# Patient Record
Sex: Female | Born: 1970
Health system: Southern US, Community
[De-identification: ages and names within clinical notes are randomized; demographics above are authoritative.]

## PROBLEM LIST (undated history)

## (undated) DIAGNOSIS — I10 Essential (primary) hypertension: Secondary | ICD-10-CM

## (undated) DIAGNOSIS — J45909 Unspecified asthma, uncomplicated: Secondary | ICD-10-CM

## (undated) DIAGNOSIS — D649 Anemia, unspecified: Secondary | ICD-10-CM

## (undated) DIAGNOSIS — C801 Malignant (primary) neoplasm, unspecified: Secondary | ICD-10-CM

## (undated) HISTORY — DX: Anemia, unspecified: D64.9

## (undated) HISTORY — PX: BREAST EXCISIONAL BIOPSY: SUR124

## (undated) HISTORY — DX: Malignant (primary) neoplasm, unspecified: C80.1

## (undated) HISTORY — DX: Essential (primary) hypertension: I10

## (undated) HISTORY — PX: CERVICAL BIOPSY: SHX590

## (undated) HISTORY — PX: COLONOSCOPY: SHX174

## (undated) HISTORY — PX: COSMETIC SURGERY: SHX468

## (undated) HISTORY — PX: ESOPHAGOGASTRODUODENOSCOPY: SHX1529

## (undated) HISTORY — PX: CHOLECYSTECTOMY: SHX55

---

## 1999-09-01 ENCOUNTER — Other Ambulatory Visit: Admission: RE | Admit: 1999-09-01 | Discharge: 1999-09-01 | Payer: Self-pay | Admitting: Obstetrics and Gynecology

## 1999-10-11 ENCOUNTER — Inpatient Hospital Stay (HOSPITAL_COMMUNITY): Admission: AD | Admit: 1999-10-11 | Discharge: 1999-10-11 | Payer: Self-pay | Admitting: Obstetrics & Gynecology

## 1999-12-03 ENCOUNTER — Encounter: Payer: Self-pay | Admitting: Obstetrics and Gynecology

## 1999-12-03 ENCOUNTER — Ambulatory Visit (HOSPITAL_COMMUNITY): Admission: RE | Admit: 1999-12-03 | Discharge: 1999-12-03 | Payer: Self-pay | Admitting: Obstetrics and Gynecology

## 1999-12-11 ENCOUNTER — Ambulatory Visit (HOSPITAL_COMMUNITY): Admission: RE | Admit: 1999-12-11 | Discharge: 1999-12-11 | Payer: Self-pay | Admitting: Obstetrics and Gynecology

## 1999-12-11 ENCOUNTER — Encounter: Payer: Self-pay | Admitting: Obstetrics and Gynecology

## 1999-12-16 ENCOUNTER — Inpatient Hospital Stay (HOSPITAL_COMMUNITY): Admission: AD | Admit: 1999-12-16 | Discharge: 1999-12-16 | Payer: Self-pay | Admitting: Obstetrics and Gynecology

## 2000-05-09 ENCOUNTER — Encounter: Payer: Self-pay | Admitting: *Deleted

## 2000-05-09 ENCOUNTER — Encounter: Admission: RE | Admit: 2000-05-09 | Discharge: 2000-05-09 | Payer: Self-pay | Admitting: *Deleted

## 2001-03-16 ENCOUNTER — Inpatient Hospital Stay (HOSPITAL_COMMUNITY): Admission: AD | Admit: 2001-03-16 | Discharge: 2001-03-16 | Payer: Self-pay | Admitting: Obstetrics and Gynecology

## 2001-03-19 ENCOUNTER — Inpatient Hospital Stay (HOSPITAL_COMMUNITY): Admission: AD | Admit: 2001-03-19 | Discharge: 2001-03-22 | Payer: Self-pay | Admitting: Obstetrics & Gynecology

## 2001-04-23 ENCOUNTER — Other Ambulatory Visit: Admission: RE | Admit: 2001-04-23 | Discharge: 2001-04-23 | Payer: Self-pay | Admitting: Obstetrics and Gynecology

## 2002-05-08 ENCOUNTER — Other Ambulatory Visit: Admission: RE | Admit: 2002-05-08 | Discharge: 2002-05-08 | Payer: Self-pay | Admitting: Obstetrics and Gynecology

## 2003-05-14 ENCOUNTER — Other Ambulatory Visit: Admission: RE | Admit: 2003-05-14 | Discharge: 2003-05-14 | Payer: Self-pay | Admitting: Obstetrics and Gynecology

## 2003-07-29 ENCOUNTER — Ambulatory Visit (HOSPITAL_COMMUNITY): Admission: RE | Admit: 2003-07-29 | Discharge: 2003-07-29 | Payer: Self-pay | Admitting: Obstetrics and Gynecology

## 2003-07-29 IMAGING — US US OB COMP LESS 14 WK
1 series · 18 of 26 positions shown · non-contrast
Comparison: none

CLINICAL DATA: Spotting.  No fetal heart tones noted on office ultrasound.  G3 P1 SAB1.  EDC [DATE] by office ultrasound.
 OBSTETRICAL ULTRASOUND WITH TRANSVAGINAL SCAN:
 Transabdominal and transvaginal scanning of the pelvis was performed.  
 There is an intrauterine gestational sac containing a single living fetus with a regular heart rate of 182 bpm.  By crown rump length, the gestation is estimated at 10 weeks 1 day.  The patient is 9 weeks 3 days by first office ultrasound.  Yolk sac and amnion are identified.  
 There is an anterior fibroid measuring 3.4 x 2.6 x 4.0 cm.  Right ovary could not be seen.  Left ovary is normal.
 IMPRESSION
 Single living intrauterine fetus.  Patient is 9 weeks 3 days by first office ultrasound and measures 10 weeks 1 day today.  Growth is appropriate.
 Anterior fibroid measuring 3.4 x 2.6 x 4.0 cm.

[Series 1: us ob transvaginal modify · 18 of 26 slices shown]
[im 1/26]
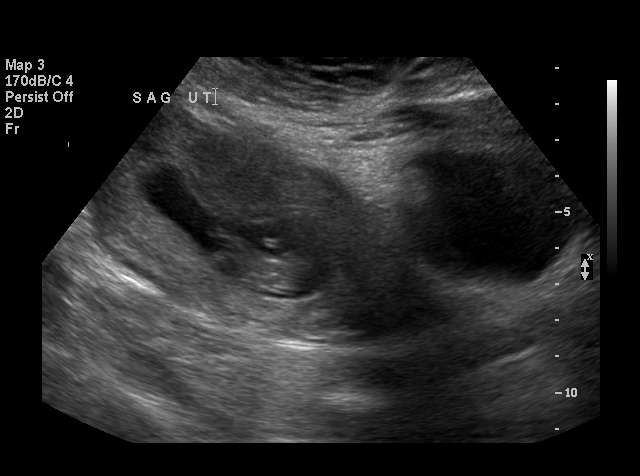
[im 3/26]
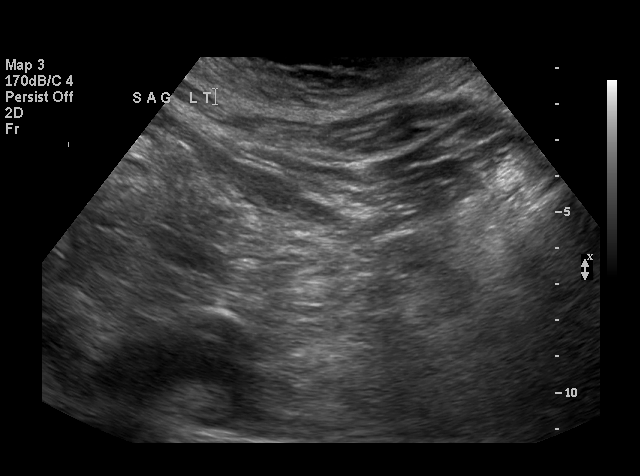
[im 4/26]
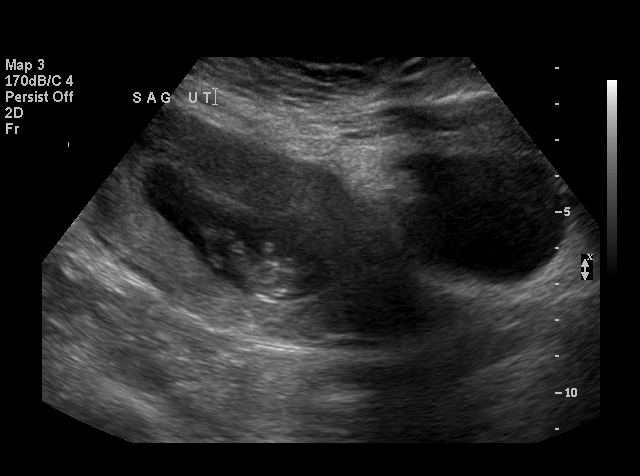
[im 6/26]
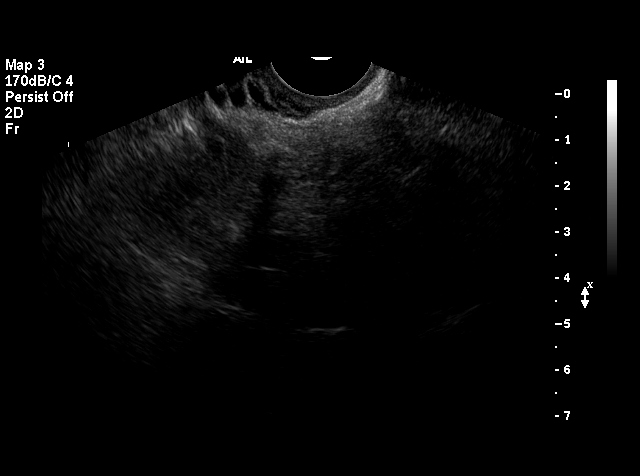
[im 7/26]
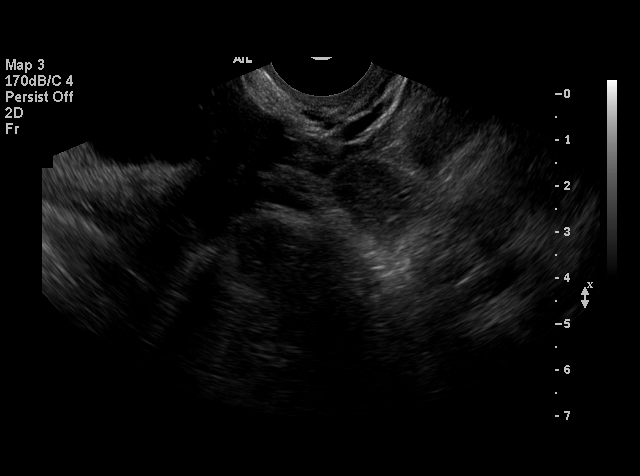
[im 8/26]
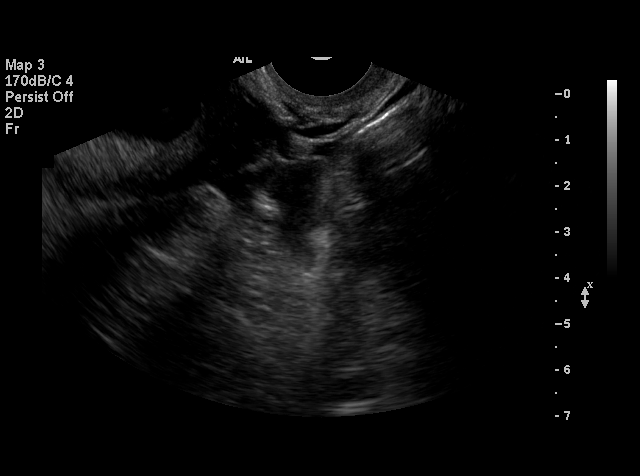
[im 10/26]
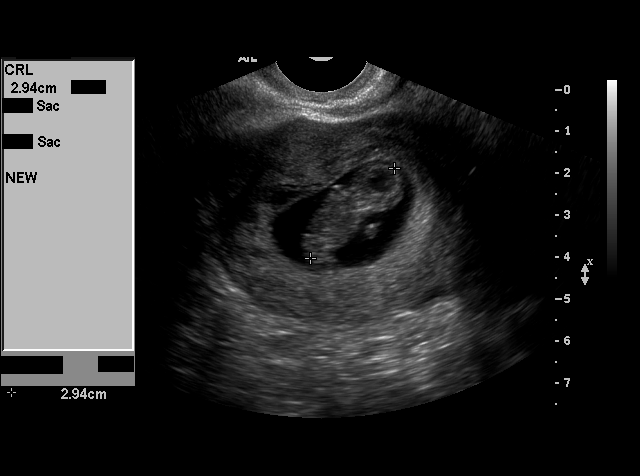
[im 11/26]
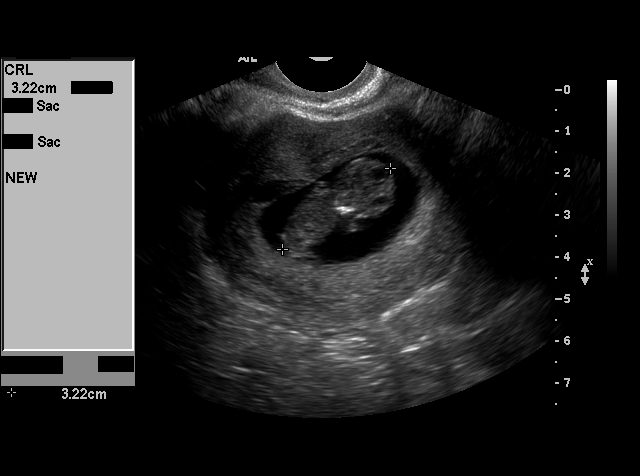
[im 13/26]
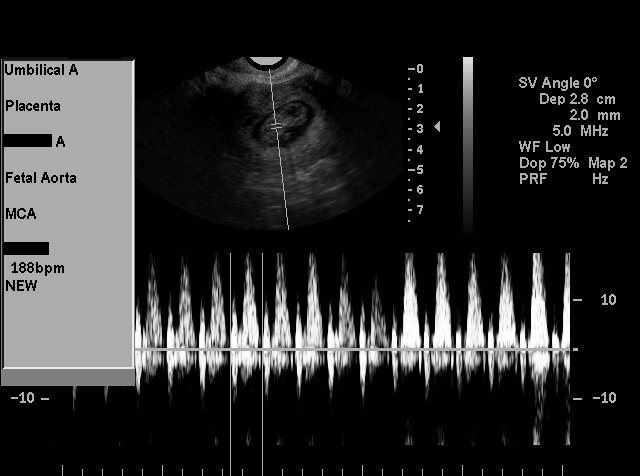
[im 14/26]
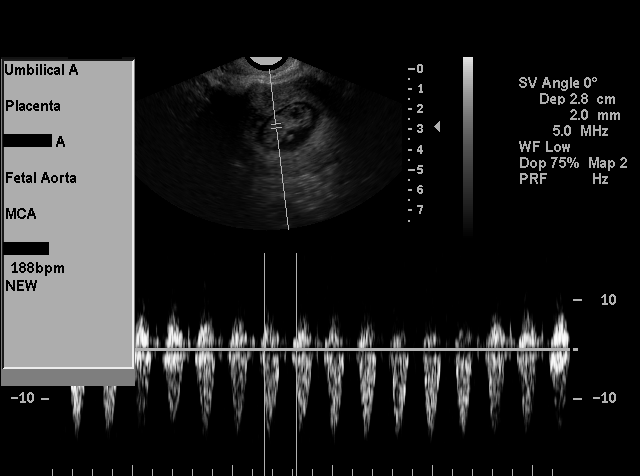
[im 16/26]
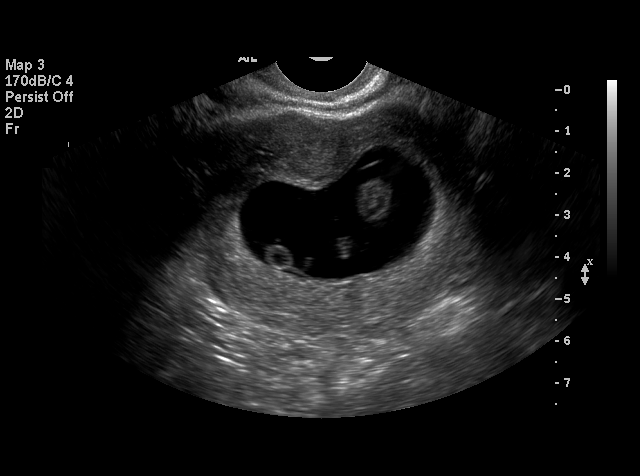
[im 17/26]
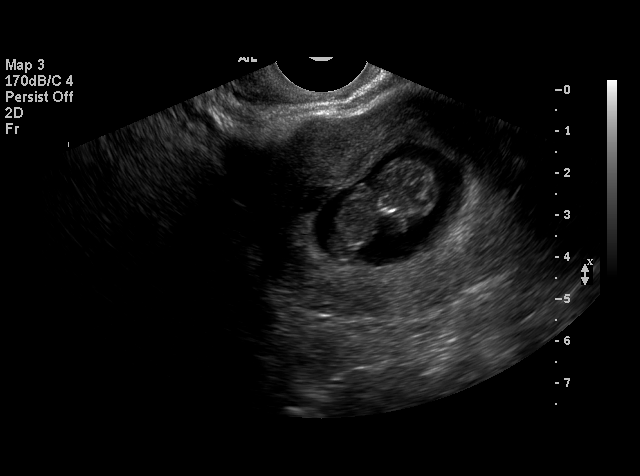
[im 19/26]
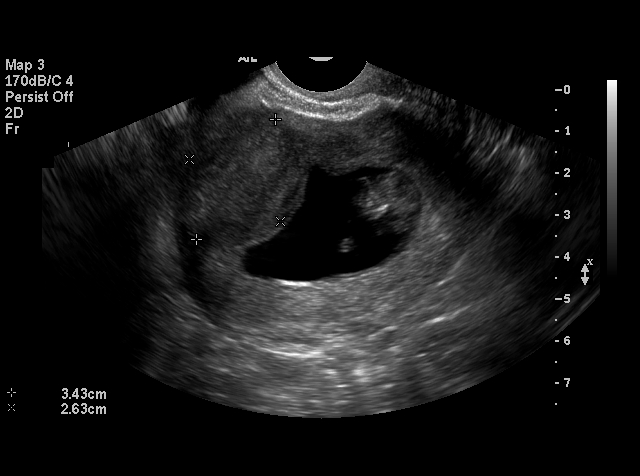
[im 20/26]
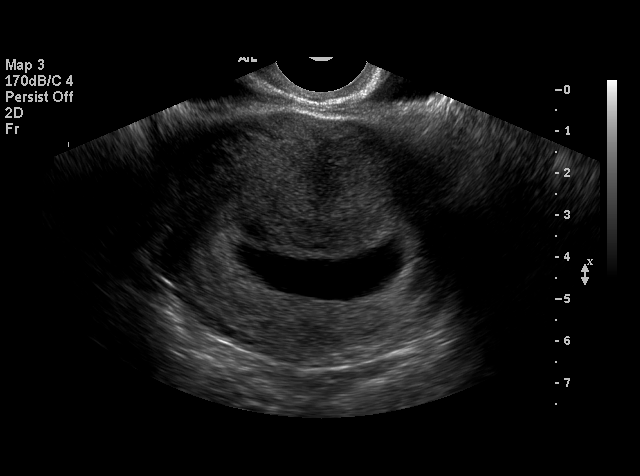
[im 21/26]
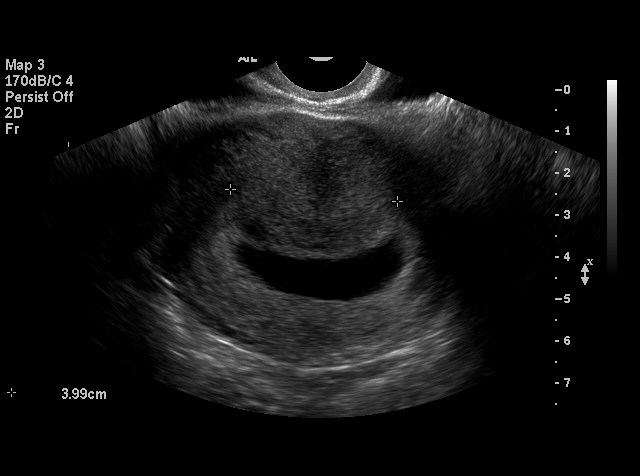
[im 23/26]
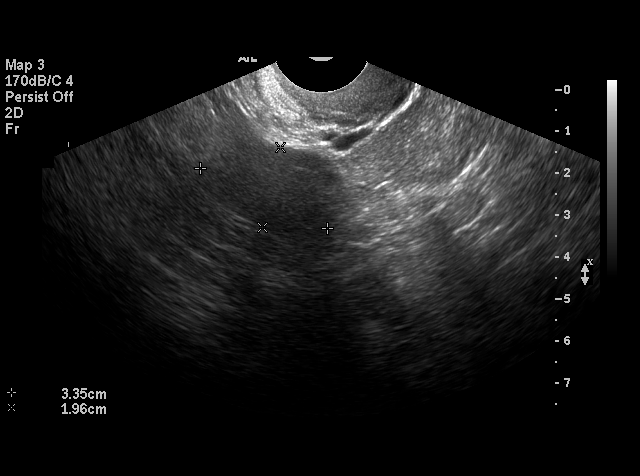
[im 24/26]
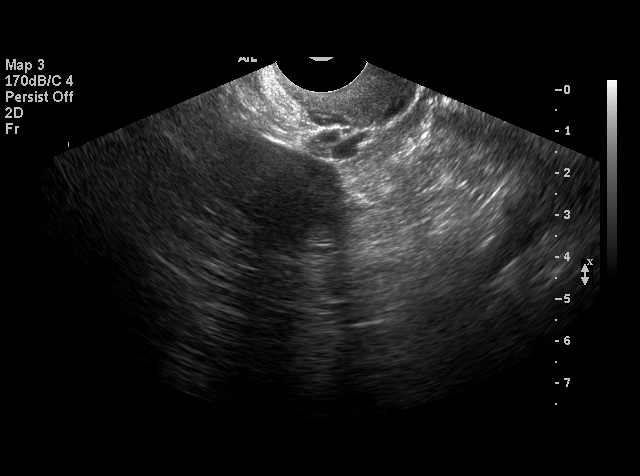
[im 26/26]
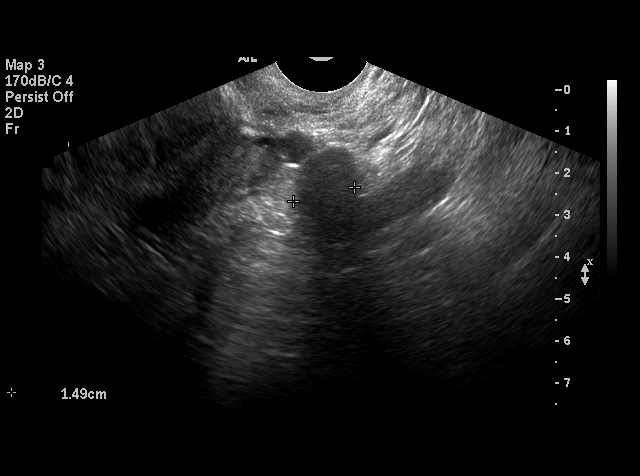

[18 of 26 positions shown; findings below may reference images not displayed]

## 2003-09-25 ENCOUNTER — Ambulatory Visit (HOSPITAL_COMMUNITY): Admission: RE | Admit: 2003-09-25 | Discharge: 2003-09-25 | Payer: Self-pay | Admitting: Obstetrics and Gynecology

## 2003-09-25 IMAGING — US US OB COMP +14 WK
1 series · 11 of 11 positions shown · non-contrast
Comparison: none

CLINICAL DATA: Anatomic exam.

[Series 1: unknown · 0.17mm/px · 11 of 11 slices shown]
[im 1/11]
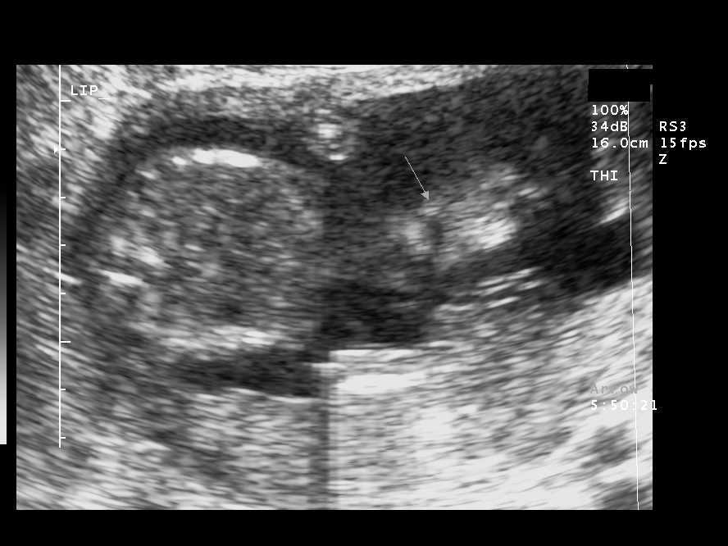
[im 2/11]
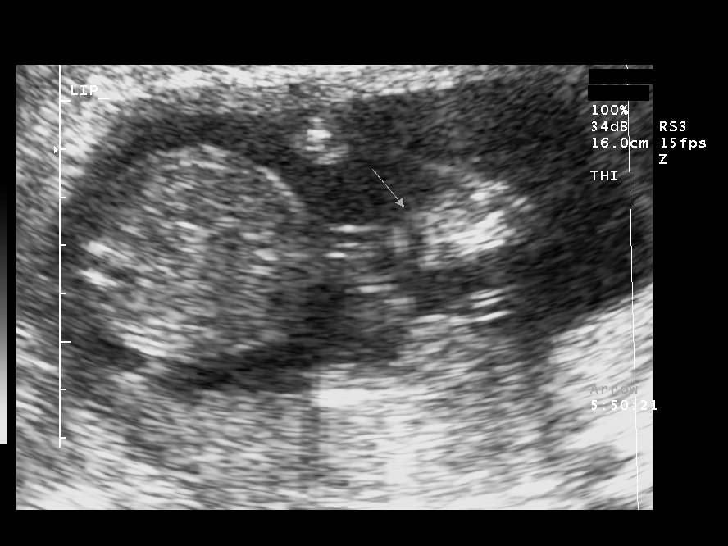
[im 3/11]
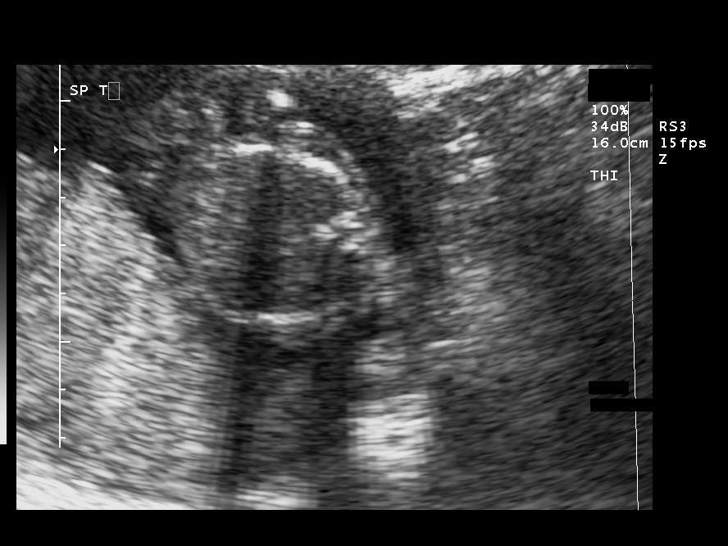
[im 4/11]
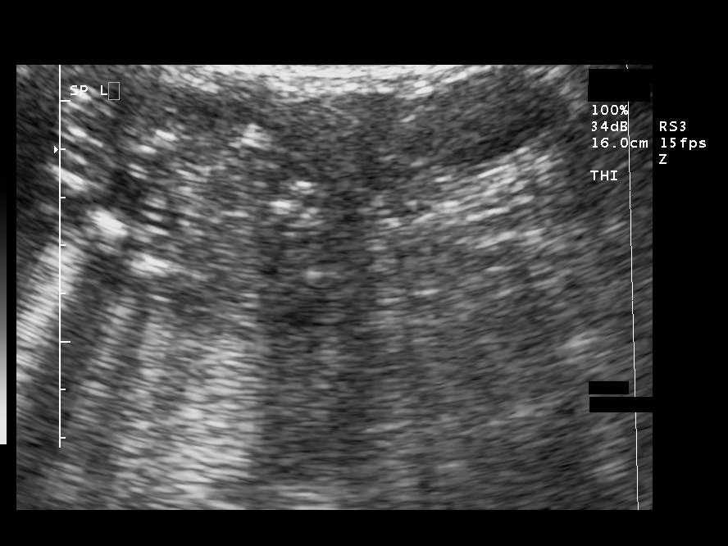
[im 5/11]
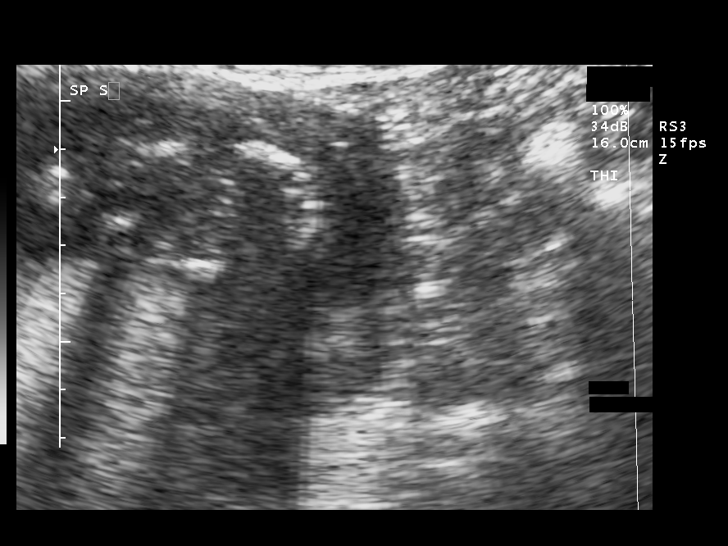
[im 6/11]
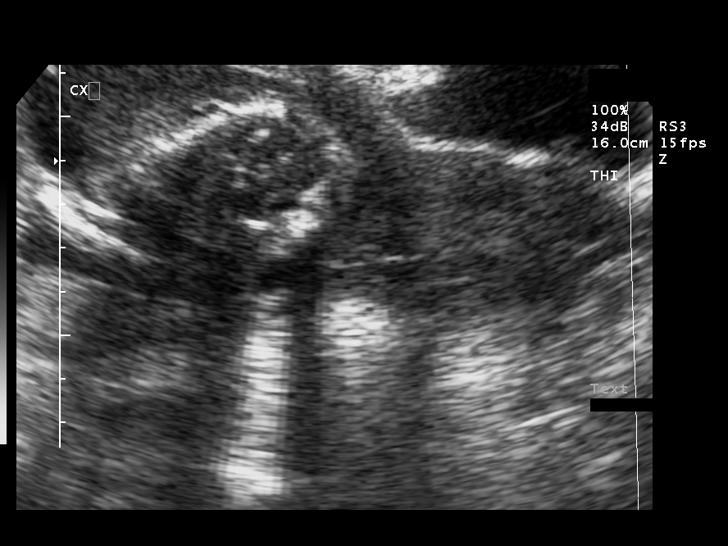
[im 7/11]
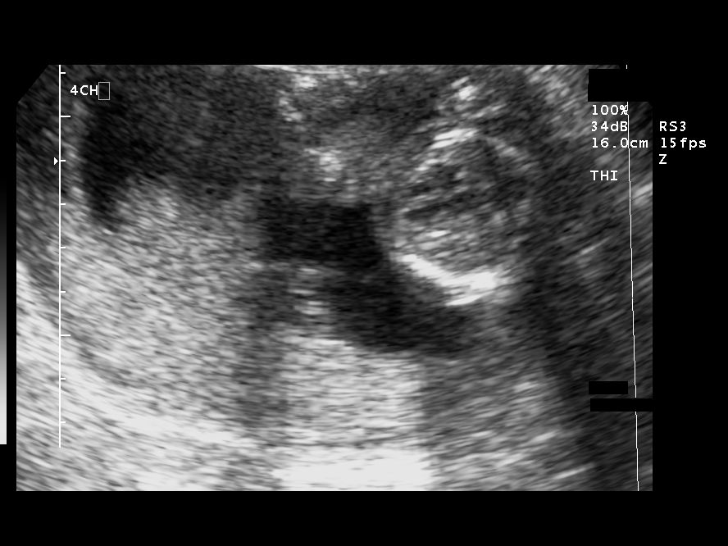
[im 8/11]
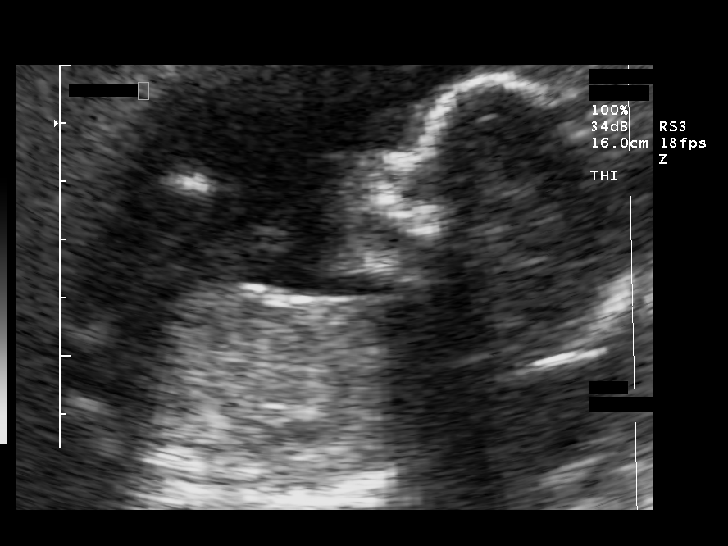
[im 9/11]
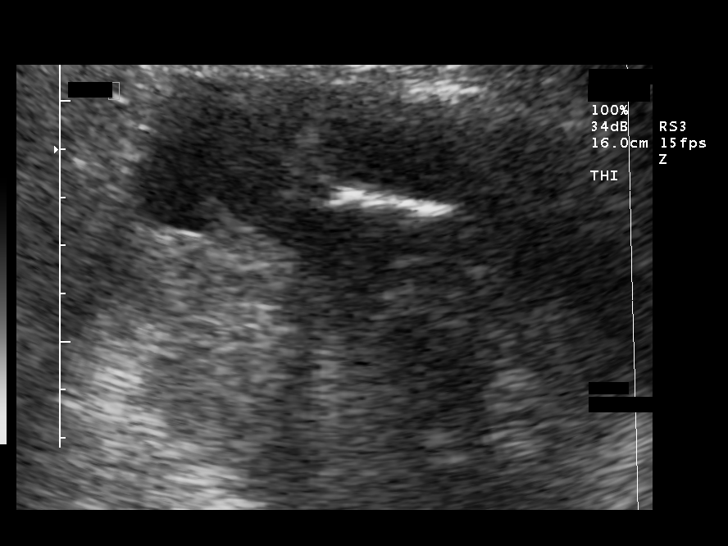
[im 10/11]
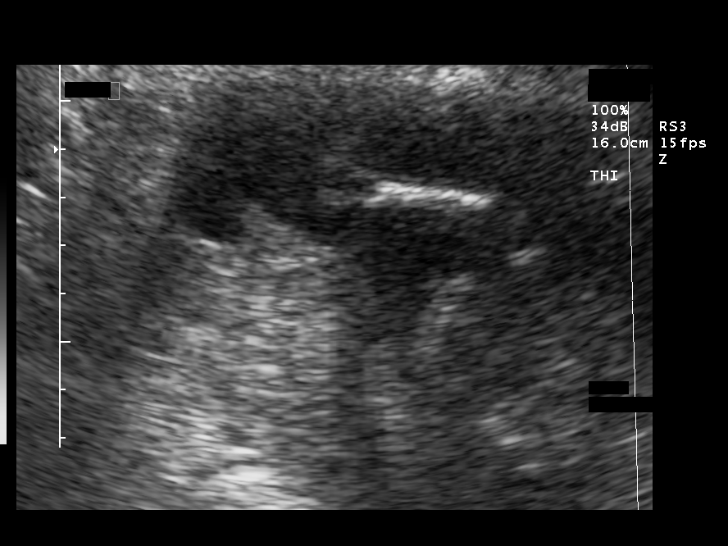
[im 11/11]
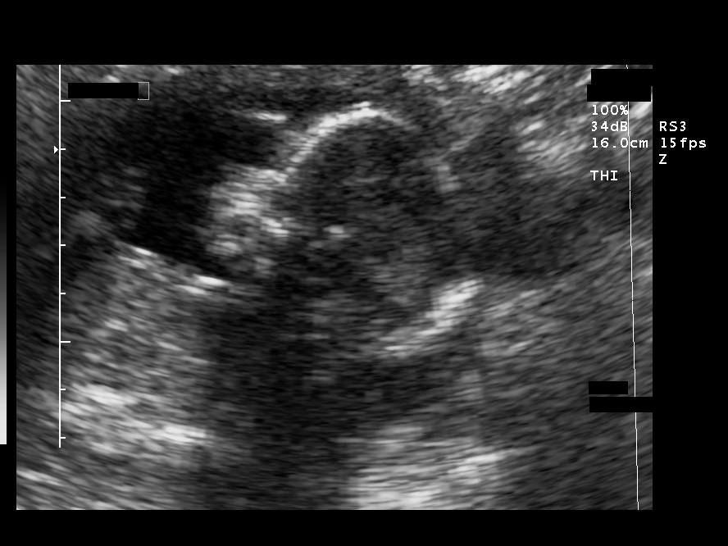

[11 of 11 positions shown; findings below may reference images not displayed]

OBSTETRICAL ULTRASOUND
Number of Fetuses: 1
Heart Rate:  142
Movement:  Yes
Breathing:  No  
Presentation:  Cephalic
Placental Location:  Posterior
Grade:  I
Previa:  No
Amniotic Fluid (Subjective):  Normal
Amniotic Fluid (Objective):   4.1 cm Vertical pocket 

FETAL BIOMETRY
BPD:  4.0 cm   18 w 4 d
HC:  15.7 cm   18 w 1 d
AC:  14.9 cm   20 w 1 d
FL:  2.8 cm   18 w 4 d

MEAN GA:  18 w 6 d
1st US GA:  17 w 5 d (assigned)    

FETAL ANATOMY
Lateral Ventricles:  Visualized 
Thalami/CSP:      Visualized 
Posterior Fossa:  Visualized 
Nuchal Region:    Visualized 
Spine:      Visualized 
4 Chamber Heart on Left:      Visualized 
Stomach on Left:      Visualized 
3 Vessel Cord:    Visualized 
Cord Insertion site:    Visualized 
Kidneys:  Visualized 
Bladder:  Visualized 
Extremities:      Visualized 

ADDITIONAL ANATOMY VISUALIZED:  Upper lip, orbits, profile, diaphragm, heel, 5th digit, and female genitalia

Evaluation limited by:  Maternal habitus

MATERNAL FINDINGS
Cervix: 4.4 cm Transabdominally
Comment:  The ovaries are within normal limits.
IMPRESSION: Single intrauterine pregnancy demonstrating an estimated gestational age by ultrasound of 18 weeks and 6 days.  This is 1 week and 1 day ahead of assigned gestational age by initial ultrasound of 17 weeks and 5 days.  
No focal fetal or placental abnormalities are noted.  An incomplete cardiac exam was possible due to maternal body habitus limiting resolution capability on today?s exam.  Follow-up evaluation at a later point in gestation can be undertaken for hopeful improvement in cardiac visualization.

## 2003-09-25 IMAGING — US US OB COMP +14 WK
1 series · 13 of 28 positions shown · non-contrast
Comparison: none

CLINICAL DATA: Anatomic exam.

[Series 1: unknown · 0.18mm/px · 13 of 62 slices shown]
[im 3/62]
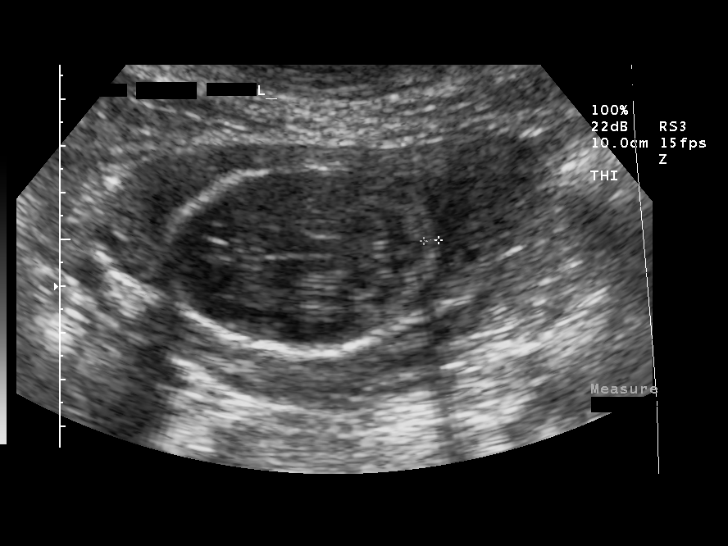
[im 7/62]
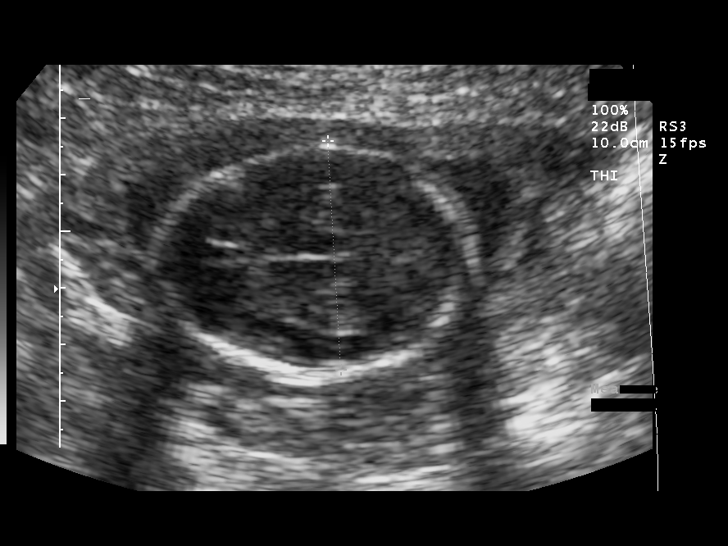
[im 12/62]
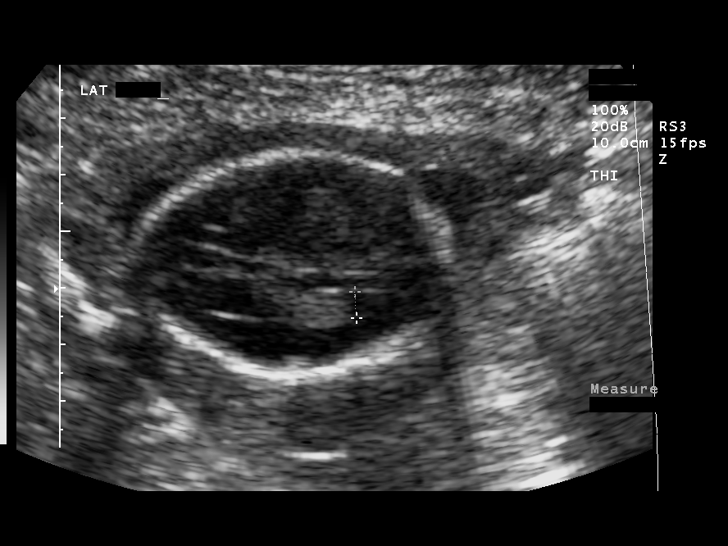
[im 16/62]
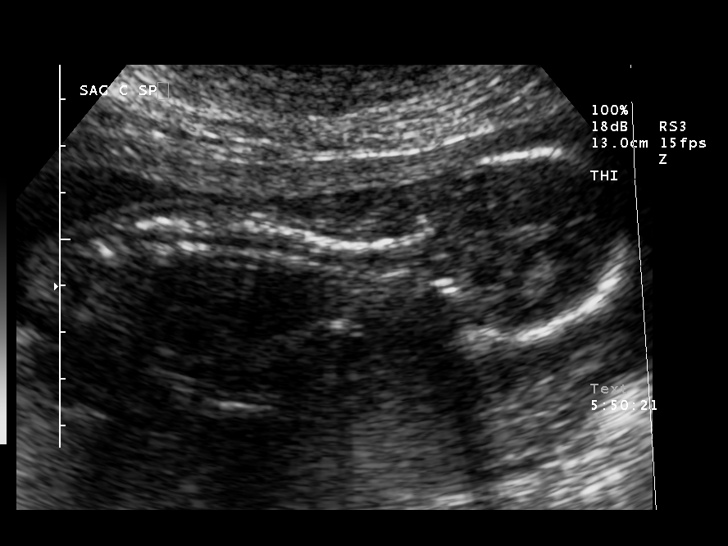
[im 21/62]
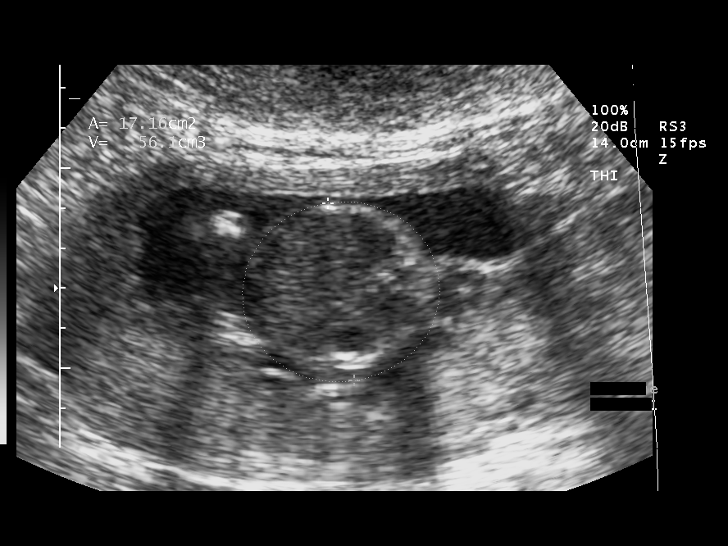
[im 25/62]
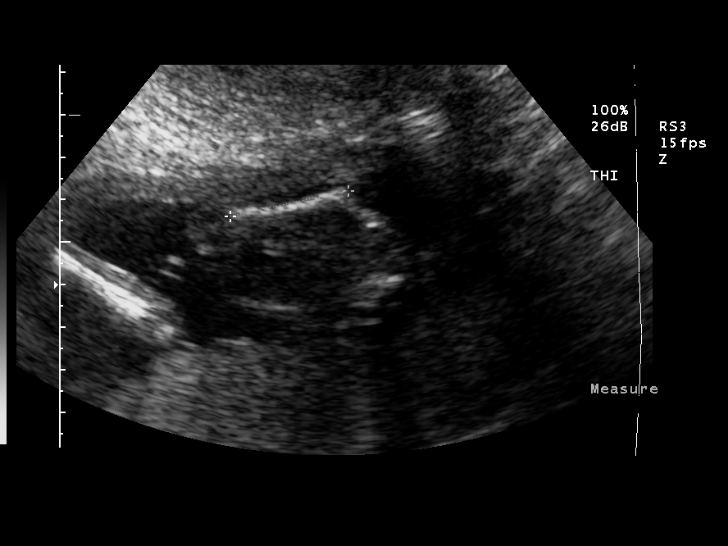
[im 32/62]
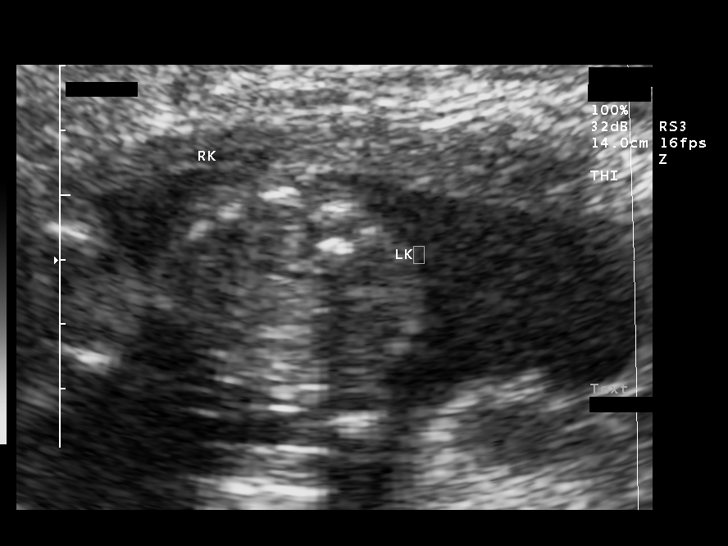
[im 37/62]
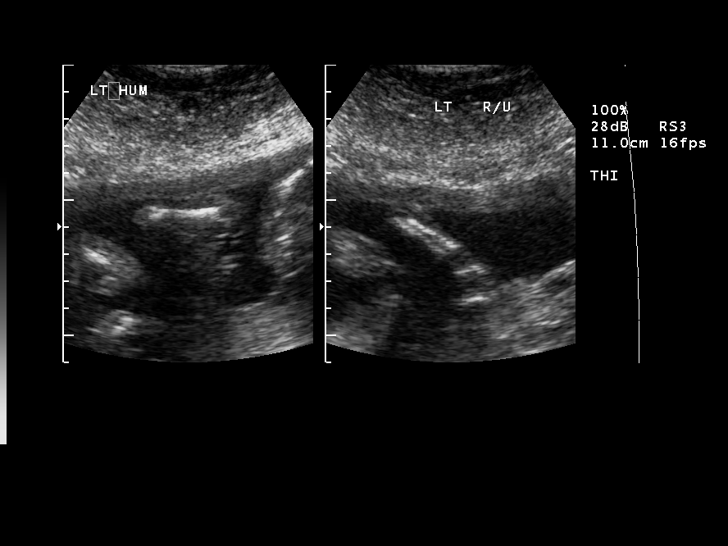
[im 41/62]
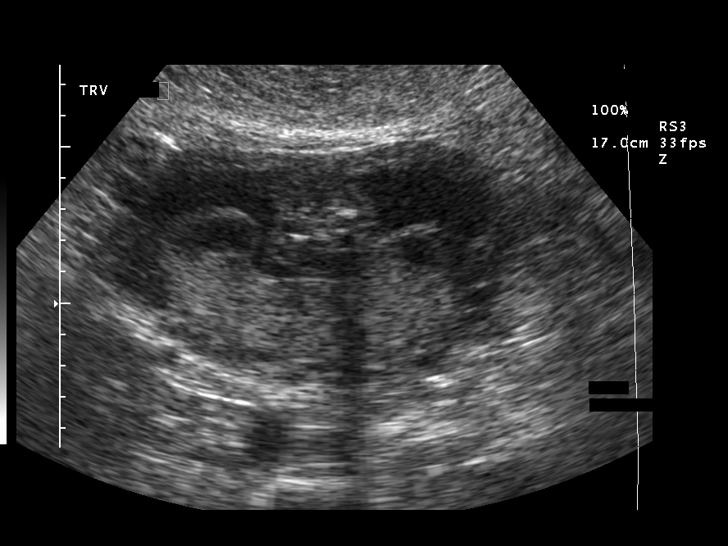
[im 46/62]
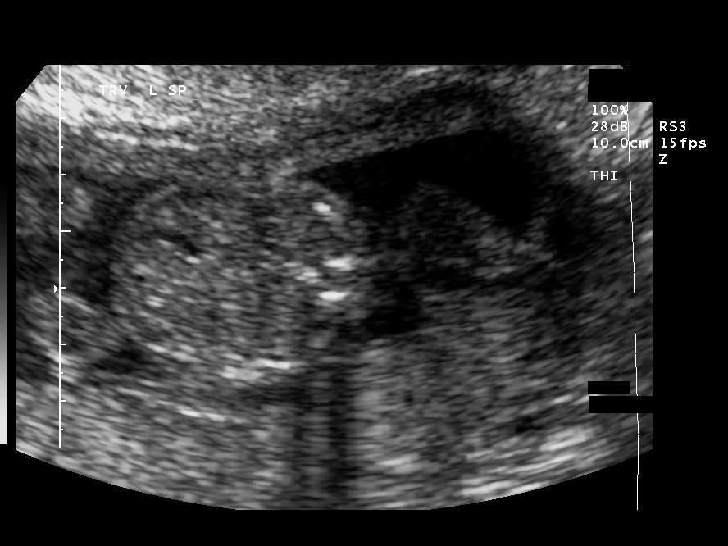
[im 50/62]
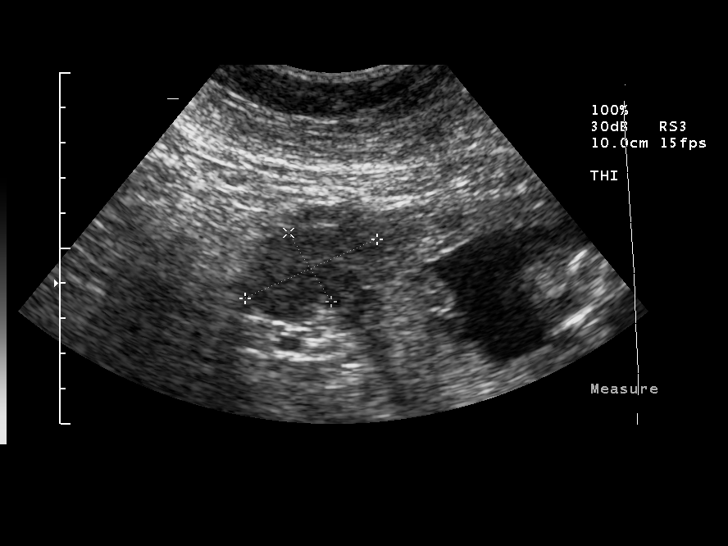
[im 55/62]
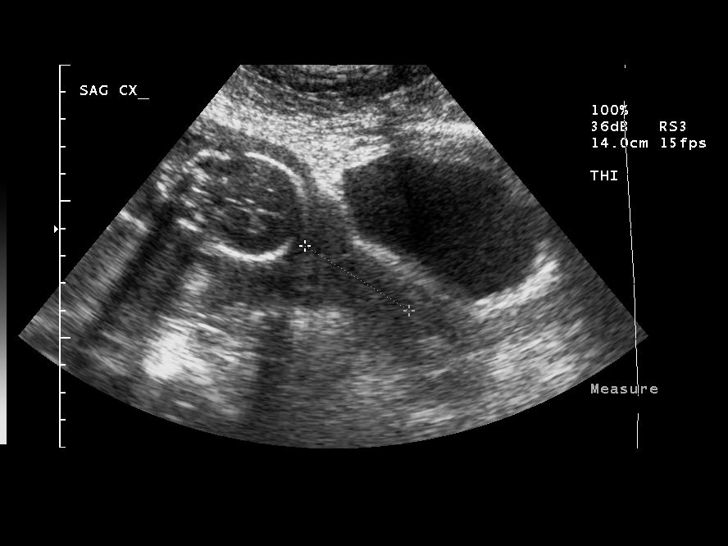
[im 59/62]
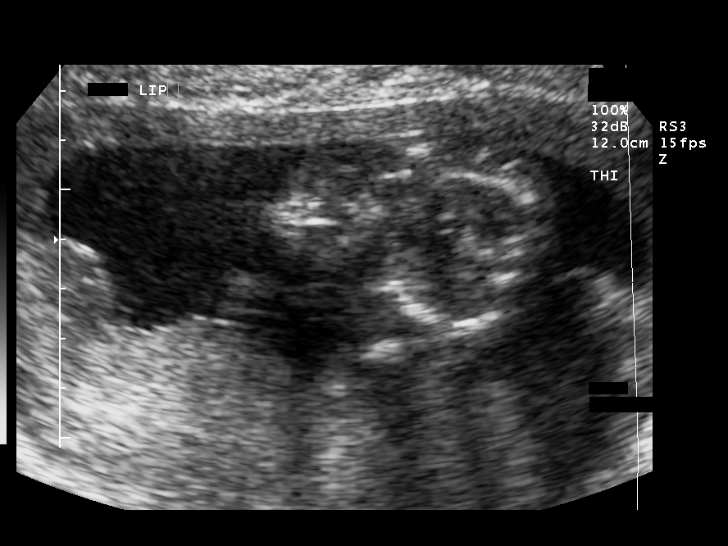

[13 of 28 positions shown; findings below may reference images not displayed]

OBSTETRICAL ULTRASOUND
Number of Fetuses: 1
Heart Rate:  142
Movement:  Yes
Breathing:  No  
Presentation:  Cephalic
Placental Location:  Posterior
Grade:  I
Previa:  No
Amniotic Fluid (Subjective):  Normal
Amniotic Fluid (Objective):   4.1 cm Vertical pocket 

FETAL BIOMETRY
BPD:  4.0 cm   18 w 4 d
HC:  15.7 cm   18 w 1 d
AC:  14.9 cm   20 w 1 d
FL:  2.8 cm   18 w 4 d

MEAN GA:  18 w 6 d
1st US GA:  17 w 5 d (assigned)    

FETAL ANATOMY
Lateral Ventricles:  Visualized 
Thalami/CSP:      Visualized 
Posterior Fossa:  Visualized 
Nuchal Region:    Visualized 
Spine:      Visualized 
4 Chamber Heart on Left:      Visualized 
Stomach on Left:      Visualized 
3 Vessel Cord:    Visualized 
Cord Insertion site:    Visualized 
Kidneys:  Visualized 
Bladder:  Visualized 
Extremities:      Visualized 

ADDITIONAL ANATOMY VISUALIZED:  Upper lip, orbits, profile, diaphragm, heel, 5th digit, and female genitalia

Evaluation limited by:  Maternal habitus

MATERNAL FINDINGS
Cervix: 4.4 cm Transabdominally
Comment:  The ovaries are within normal limits.
IMPRESSION: Single intrauterine pregnancy demonstrating an estimated gestational age by ultrasound of 18 weeks and 6 days.  This is 1 week and 1 day ahead of assigned gestational age by initial ultrasound of 17 weeks and 5 days.  
No focal fetal or placental abnormalities are noted.  An incomplete cardiac exam was possible due to maternal body habitus limiting resolution capability on today?s exam.  Follow-up evaluation at a later point in gestation can be undertaken for hopeful improvement in cardiac visualization.

## 2004-02-10 ENCOUNTER — Inpatient Hospital Stay (HOSPITAL_COMMUNITY): Admission: RE | Admit: 2004-02-10 | Discharge: 2004-02-12 | Payer: Self-pay | Admitting: Obstetrics & Gynecology

## 2004-02-10 ENCOUNTER — Encounter (INDEPENDENT_AMBULATORY_CARE_PROVIDER_SITE_OTHER): Payer: Self-pay | Admitting: Specialist

## 2004-11-10 ENCOUNTER — Other Ambulatory Visit: Admission: RE | Admit: 2004-11-10 | Discharge: 2004-11-10 | Payer: Self-pay | Admitting: Obstetrics and Gynecology

## 2006-08-15 HISTORY — PX: GASTRIC BYPASS: SHX52

## 2007-08-16 HISTORY — PX: DILATION AND CURETTAGE OF UTERUS: SHX78

## 2008-04-11 ENCOUNTER — Encounter (INDEPENDENT_AMBULATORY_CARE_PROVIDER_SITE_OTHER): Payer: Self-pay | Admitting: Obstetrics and Gynecology

## 2008-04-11 ENCOUNTER — Ambulatory Visit (HOSPITAL_COMMUNITY): Admission: RE | Admit: 2008-04-11 | Discharge: 2008-04-11 | Payer: Self-pay | Admitting: Obstetrics and Gynecology

## 2010-12-28 NOTE — Op Note (Signed)
Angela Miles, Angela Miles                  ACCOUNT NO.:  [id number]   MEDICAL RECORD NO.:  [id number]          PATIENT TYPE:  AMB   LOCATION:  SDC                           FACILITY:  [facility]   PHYSICIAN:  Miguel Aschoff, M.D.       DATE OF BIRTH:  1971/02/12   DATE OF PROCEDURE:  04/11/2008  DATE OF DISCHARGE:                               OPERATIVE REPORT   PREOPERATIVE DIAGNOSIS:  Menorrhagia.   POSTOPERATIVE DIAGNOSIS:  Menorrhagia.   PROCEDURE:  Cervical dilatation, hysteroscopy, and uterine curettage  followed by NovaSure endometrial ablation.   SURGEON:  Miguel Aschoff, MD   ANESTHESIA:  General.   COMPLICATIONS:  None.   JUSTIFICATION:  The patient is a 40 year old white female with history  of very heavy menses, status post previous tubal sterilization and now  requests that a procedure to be carried out in effort to control her  heavy flow.  The patient was offered alternatives of Mirena IUD  treatment versus endometrial ablation, and has elected to undergo  endometrial ablation.  The risks and benefits of this procedure were  discussed with the patient and informed consent has been obtained.   DESCRIPTION OF PROCEDURE:  The patient was taken to the operating room,  placed in a supine position.  General anesthesia was administered  without difficulty.  She was then placed in dorsal lithotomy position  and prepped and draped in a usual sterile fashion.  Once this was done,  examination under anesthesia was carried out which revealed normal  external genitalia, normal Bartholin and Skene glands, and normal  urethra.  The vaginal vault was without gross lesion.  The cervix was  without gross lesion.  Uterus was noted to be in anterior with normal  size and shape.  No adnexal masses were noted.  At this point, speculum  was placed in the vaginal vault.  Anterior cervical lip was grasped with  a tenaculum and then the uterus was sounded to 8 cm.  Cervical length of  4 cm was then  determined.  At this point, the cervix was further dilated  using serial Pratt dilators until a #25 Pratt dilator could be passed.  At this point, the diagnostic hysteroscope was advanced through the  endocervix.  No endocervical lesions were noted.  The endometrial cavity  was then visualized.  No polyps or submucous myomas were found.  At this  point, the uterus was curetted using a medium-sized serrated curette.  The tissue obtained was sent for histologic study.  At this point, the  NovaSure endometrial ablation unit was introduced into the uterine  cavity, cavity width of 4 cm was determined, and then a treatment cycle  was carried out at 88 watts for 1 minute and 55 seconds.  After  completion of the ablation, the hysteroscope was reintroduced.  The  cavity appeared to be well coagulated.  Following this, the hysteroscope  was removed.  The cervix was injected with 18 mL of 1% Xylocaine by  placing 6 mL at the 2, 4, and 8 o'clock positions, and hemostasis was  readily  achieved.  All instruments were removed.  The patient was taken  out of the lithotomy position.  The anesthesia was reversed and she was  brought to the recovery room in satisfactory condition.  Plan is for the  patient to be discharged home.  Medications for home include  doxycycline 1 twice a day x3 days and Darvocet-N 100 1 every 4 hours  needed for pain.  She was instructed to do no heavy lifting and to call  for any problems such as fever, pain, or heavy bleeding.  The patient  will be seen back in 4 weeks for followup examination.      Miguel Aschoff, M.D.  Electronically Signed     AR/MEDQ  D:  04/11/2008  T:  04/12/2008  Job:  [id number]

## 2010-12-31 NOTE — Op Note (Signed)
Angela Miles, Angela Miles                            ACCOUNT NO.:  [id number]   MEDICAL RECORD NO.:  [id number]                   PATIENT TYPE:  INP   LOCATION:  9133                                 FACILITY:  [facility]   PHYSICIAN:  Miguel Aschoff, M.D.                    DATE OF BIRTH:  Sep 18, 1970   DATE OF PROCEDURE:  02/10/2004  DATE OF DISCHARGE:                                 OPERATIVE REPORT   PREOPERATIVE DIAGNOSES:  Intrauterine pregnancy at 38 weeks for elective  repeat cesarean section and desired sterilization.   POSTOPERATIVE DIAGNOSES:  Intrauterine pregnancy at 38 weeks for elective  repeat cesarean section and desired sterilization. Delivery of viable female  infant, Apgar 9 & 9 weighing  8 pounds 14 ounces.   PROCEDURE:  Repeat low transverse cesarean section, bilateral Pomeroy tubal  sterilization.   SURGEON:  Miguel Aschoff, M.D.   ANESTHESIA:  Spinal.   COMPLICATIONS:  None.   JUSTIFICATION:  The patient is a 40 year old white female, gravida 3, para 1-  0-1-1 status post previous cesarean section in 2002.  The patient is now at  40 weeks, has a history of gestational diabetes and requests that an  elective repeat cesarean section be performed. In addition, she requested  that a tubal sterilization be performed and informed consent has been  obtained. The risks and benefits of the procedure were discussed with the  patient.   DESCRIPTION OF PROCEDURE:  The patient was taken to the operating room,  placed in the sitting position, and spinal anesthesia was administered  without difficulty. She was then placed in the supine position, deviated to  the left. She ws prepped and draped in the usual sterile fashion, a Foley  catheter was inserted.  After this was done, her previous Pfannenstiel  incision was reincised with the incision extending down through the  subcutaneous tissue.  Bleeding points were clamped and coagulated as they  were encountered. The fascia was then  identified and incised transversely.  It was separated from the underlying rectus muscles. The rectus muscles were  divided in the midline, the peritoneum was then found and entered carefully  avoiding the  underlying structures.  The peritoneal incision was extended  under direct visualization. Once this was done, a bladder flap was created  and protected with the bladder blade.  The elliptical transverse incision  was made into a low uterine segment, the amniotic cavity was entered, clear  fluid was obtained and at this point the patient was delivered of a viable  female infant Apgar 9 at 1 minute and 9 at 5 minutes from a vertex LOA  position. The patient was handed to the pediatric team in attendance. The  baby weighed 8 pounds 14 ounces. Cord bloods were obtained for appropriate  studies, the placenta was delivered, the uterus was then evacuated of any  remaining products of conception.  At this point, the angles of uterine  incision were identified and ligated using figure-of-eight sutures of #1  Vicryl and the uterus was closed in layers, the first layer was a running  interlocking suture of #1 Vicryl followed by an imbricating suture of #1  Vicryl.  The bladder flap was reapproximated using running continuous 2-0  Vicryl suture.  After this was done, attention was directed to the right  tube, it was grasped in its mid portion. This knuckle of tube was then  ligated with two ligatures of #0 plain gut, the area above the ligatures was  then excised and tubal stumps were cauterized.  The identical procedure was  then carried out on the left side with positive identification of the tube  being made.  At this point, there was good hemostasis, lab counts and  instrument counts were taken and found to be correct and then the abdomen  was closed. The parietoperitoneum was closed using continuous #0 Vicryl  suture, rectus muscles were reapproximated using running continuous #0  Vicryl  suture.  The fascia was closed using two sutures of #0 Vicryl each  starting at the lateral fascial angles and meeting in the midline. The  subcutaneous tissue was closed using interrupted #0 Vicryl suture and the  skin incision was closed using staples. The estimated blood loss was  approximately 800 mL. The patient tolerated the procedure well and went to  the recovery room in satisfactory condition.                                               Miguel Aschoff, M.D.    AR/MEDQ  D:  02/10/2004  T:  02/10/2004  Job:  [id number]

## 2010-12-31 NOTE — Op Note (Signed)
[facility]  Patient:    Angela Miles, Angela Miles                         MRN: [id number] Proc. Date: 03/19/01 Adm. Date:  [id number] Attending:  Lars Pinks                           Operative Report  PREOPERATIVE DIAGNOSES:       1. Intrauterine pregnancy at 41 weeks.                               2. Spontaneous rupture of membranes.                               3. Arrest of dilatation at 7 cm.  POSTOPERATIVE DIAGNOSES:      1. Intrauterine pregnancy at 41 weeks.                               2. Spontaneous rupture of membranes.                               3. Arrest of dilatation at 7 cm.  PROCEDURE:                    Primary low transverse cesarean section.  SURGEON:                      Marcelle Overlie, M.D.  ANESTHESIA:                   Epidural.  ESTIMATED BLOOD LOSS:         500 cc.  FINDINGS:                     Female infant in cephalic presentation with Apgars 8 at one minute, 9 at five minutes, weighing 9 pounds 5 ounces.  COMPLICATIONS:                None.  DESCRIPTION OF PROCEDURE:     The patient was taken to the operating room where her epidural was dosed and found to be adequate. She already had a Foley catheter in the bladder. The abdomen was prepped and draped in the usual sterile fashion. A scalpel was used to make a low transverse incision which was carried down to the fascia. Using good hemostasis, the fascia was scored in the midline and extended laterally using Mayo scissors. The Pfannenstiel incision was then created by dissecting the rectus muscles free. The peritoneum was entered sharply and there were no adhesions noted. The peritoneal incision was then extended. The bladder blade was inserted. Lower uterine segment was identified. The bladder flap was then created sharply and then digitally, and the bladder blade was then readjusted. Using a scalpel, a low transverse incision was made in the uterus and extended  laterally using a hemostat. The baby was in cephalic presentation and was delivered quite easily. It was a female infant weighing 9 pounds 5 ounces with Apgars 8 at one minute and 9 at five minutes. The baby was handed to the awaiting pediatricians. Cord blood was obtained. The placenta was manually  removed and noted to be intact. The uterus was cleared of all clots and debris. The uterus was then exteriorized and the uterine incision was then inspected and was then closed in a single layer using 0 chromic in continuous running locked stitch. The left corner of the incision was bleeding and was hemostatic after two figure-of-eight sutures were placed. The uterus was returned to the abdomen. The peritoneum was closed using 0 Vicryl in continuous running stitch, and the rectus muscles were reapproximated using the same 0 Vicryl. The fascia was then closed using 0 Vicryl in continuous running stitch, starting at each corner and meeting in the midline. After inspection of the subcutaneous layer and noted hemostasis, the skin was closed with staples. All sponge lap and instrument counts were correct x 2. The patient tolerated the procedure well and went to recovery room in stable condition. DD:  03/19/01 TD:  03/20/01 Job: [id number]

## 2010-12-31 NOTE — Discharge Summary (Signed)
Angela Miles, Angela Miles                            ACCOUNT NO.:  [id number]   MEDICAL RECORD NO.:  [id number]                   PATIENT TYPE:  INP   LOCATION:  9133                                 FACILITY:  [facility]   PHYSICIAN:  Carrington Clamp, M.D.              DATE OF BIRTH:  [birth year]   DATE OF ADMISSION:  02/10/2004  DATE OF DISCHARGE:  02/12/2004                                 DISCHARGE SUMMARY   FINAL DIAGNOSES:  1. Intrauterine pregnancy at [redacted] weeks gestation.  2. Desires elective repeat cesarean section and declines vaginal birth after     cesarean delivery.  3. Desires permanent sterilization.   PROCEDURES:  1. Repeat low transverse cesarean section.  2. Bilateral Pomeroy tubal sterilization.   SURGEON:  Miguel Aschoff, M.D.   COMPLICATIONS:  None.   This 40 year old G3, P1-0-1-1, presents at 68 weeks' gestation for a repeat  cesarean section.  The patient had had a prior cesarean section in 2002 and  desires a repeat with this one.  The patient's antepartum course had been  complicated by gestational diabetes mellitus.  She was able to be diet-  controlled, and she kept her sugars in good check.  At this point the  patient also requested tubal sterilization performed and the risks and  benefits were discussed with the patient.  The patient was taken to the  operating room on February 10, 2004, by Dr. Tenny Craw, where repeat low transverse  cesarean section was performed with the delivery of an 8 pound 14 ounce  female with Apgars of 9 and 9.  Delivery went without complications.  The  patient still expressed her desires for permanent sterilization, and a  bilateral Pomeroy tubal sterilization procedures was performed without  complication.  The patient's postoperative course was benign without  significant fever.  She did have some mild postoperative anemia with a  hemoglobin of 9.6 and was started on iron twice daily during her hospital  course.  She was felt ready for discharge on  postoperative day #2, was sent  home on a regular diet, told to decrease activities, told to continue her  prenatal vitamins and her iron daily, was given Percocet one to two every  four hours as needed for pain, told she could use Motrin 600 mg one every  six hours as needed for pain.  Was to follow up in the office the next day  for her staple removal.     Leilani Able, P.A.-C.                Carrington Clamp, M.D.    MB/MEDQ  D:  03/14/2004  T:  03/15/2004  Job:  [id number]

## 2010-12-31 NOTE — Op Note (Signed)
[facility]  Patient:    Angela Miles, Angela Miles Visit Number: [id number] MRN: [id number]          Service Type: Attending:  Gerrit Friends. Aldona Bar, M.D. Dictated by:   Leilani Able, P.A. Proc. Date: 03/19/01 Adm. Date:  03/22/01                             Operative Report  FINAL DIAGNOSES:              1. Intrauterine pregnancy at [redacted] weeks                                  gestation.                               2. Spontaneous rupture of membranes.                               3. Arrest of dilation at 7 cm.  PROCEDURE:                    Primary low transverse cesarean section.  SURGEON:                      Marcelle Overlie, M.D.  COMPLICATIONS:                None.  HOSPITAL COURSE:              This 40 year old, G2, P0, presents at [redacted] weeks gestation with spontaneous rupture of membranes. The patients prenatal course had been uncomplicated. She was started on Pitocin at this point. The patient had arrest of dilation at about 7 cm and therefore, was taken to the operating room for a cesarean section. The patient was taken to the operating room by Dr. Marcelle Overlie where a primary low transverse cesarean section was performed with the delivery of a 9-pound 5-ounce female infant with Apgars of 8 and 9. Delivery went without complication.  The patients postoperative course was benign without significant fevers. She did have a postoperative hemoglobin of 7.2 and was therefore started on some iron in the hospital. She was felt ready for discharge on postoperative day #3. She was sent home on a regular diet and told to decrease activities.  DISCHARGE MEDICATIONS:        1. Continue prenatal vitamins and FeSO4.                               2. Tylox, #30, one to two every four hours as                                  needed for pain.                               3. Motrin 600 mg, #30, one every six hours as                                  needed for  pain.  DISCHARGE FOLLOWUP:           The patient was told to follow up in the office in four weeks. Dictated by:   Leilani Able, P.A. Attending:  Gerrit Friends. Aldona Bar, M.D. DD:  04/23/01 TD:  04/23/01 Job: [id number]

## 2013-01-14 ENCOUNTER — Other Ambulatory Visit: Payer: Self-pay | Admitting: Obstetrics and Gynecology

## 2013-01-14 DIAGNOSIS — R928 Other abnormal and inconclusive findings on diagnostic imaging of breast: Secondary | ICD-10-CM

## 2013-01-15 ENCOUNTER — Other Ambulatory Visit: Payer: Self-pay | Admitting: Obstetrics and Gynecology

## 2013-01-15 ENCOUNTER — Ambulatory Visit
Admission: RE | Admit: 2013-01-15 | Discharge: 2013-01-15 | Disposition: A | Discharge status: Home / Self Care | Payer: BC Managed Care – PPO | Source: Ambulatory Visit | Attending: Obstetrics and Gynecology | Admitting: Obstetrics and Gynecology

## 2013-01-15 DIAGNOSIS — R928 Other abnormal and inconclusive findings on diagnostic imaging of breast: Secondary | ICD-10-CM

## 2013-01-15 IMAGING — US US BREAST*R*
1 series · 13 of 14 positions shown · non-contrast
Comparison: Previous examinations, including the screening
mammogram at [HOSPITAL[facility] on [DATE].

CLINICAL DATA: Possible right breast mass at recent screening
mammography.

DIGITAL DIAGNOSTIC RIGHT MAMMOGRAM  AND RIGHT BREAST ULTRASOUND:

[Series 2: us breast*right* · 13 of 14 slices shown]
[im 1/14]
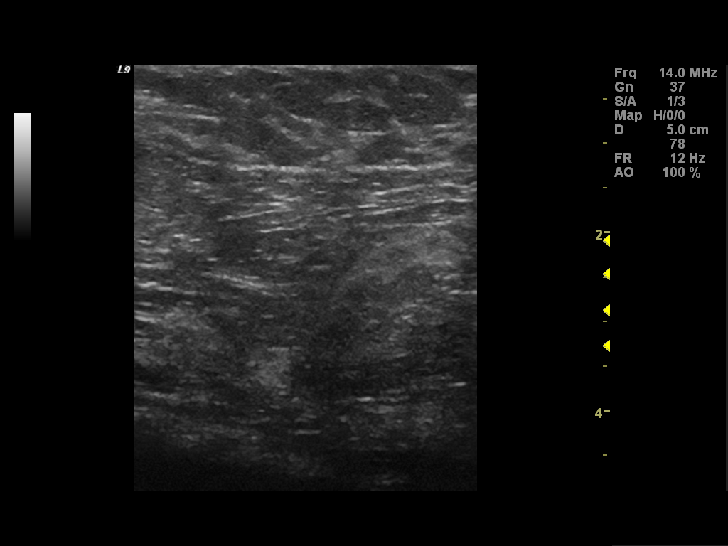
[im 2/14]
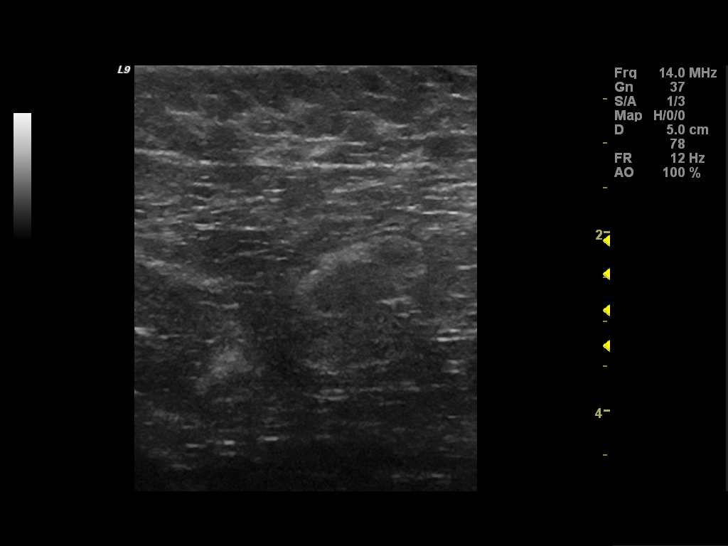
[im 3/14]
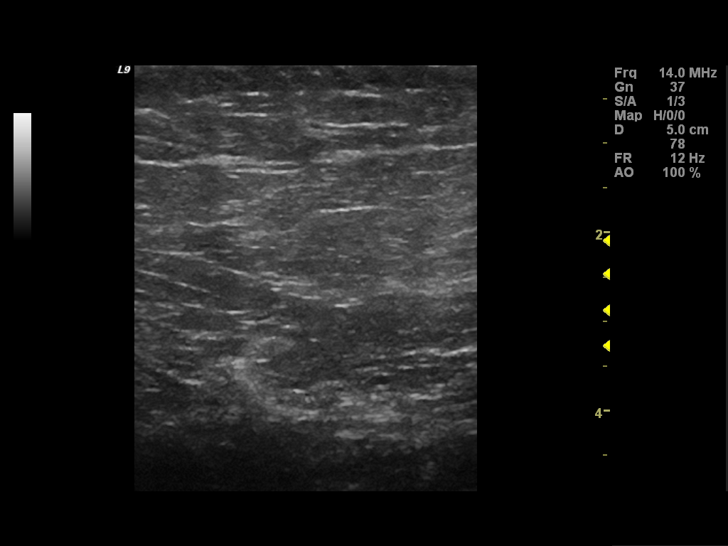
[im 4/14]
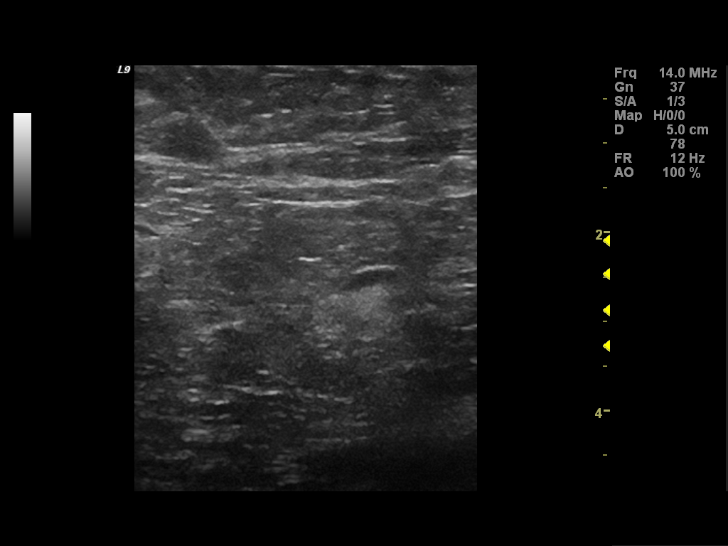
[im 5/14]
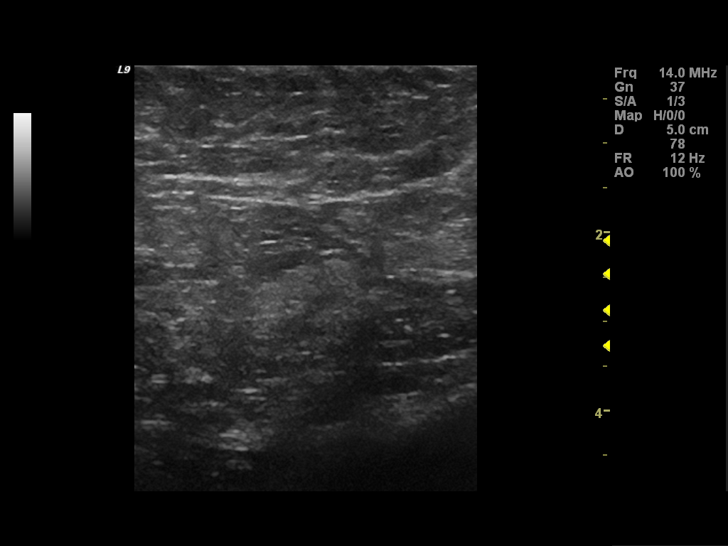
[im 6/14]
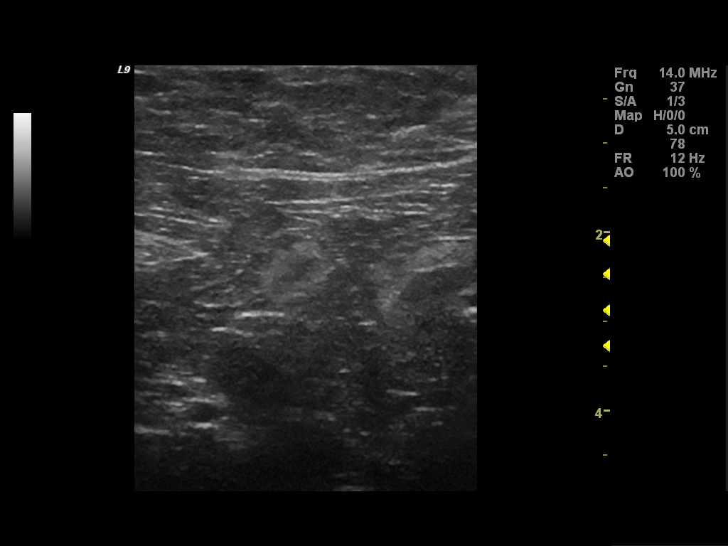
[im 8/14]
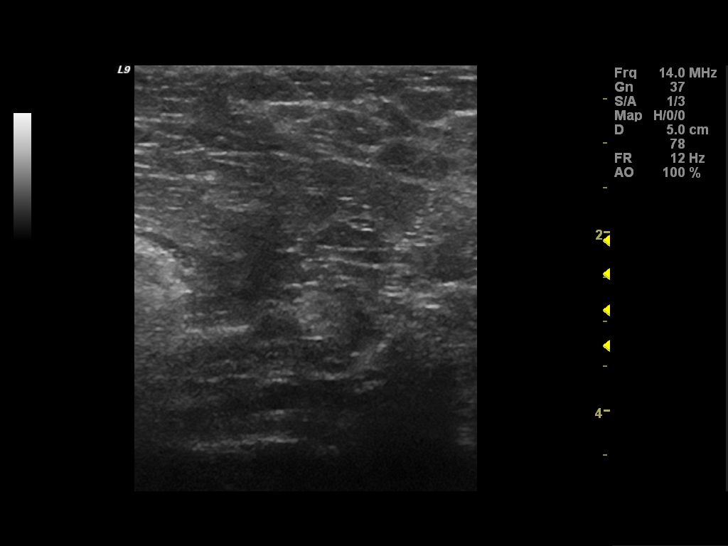
[im 9/14]
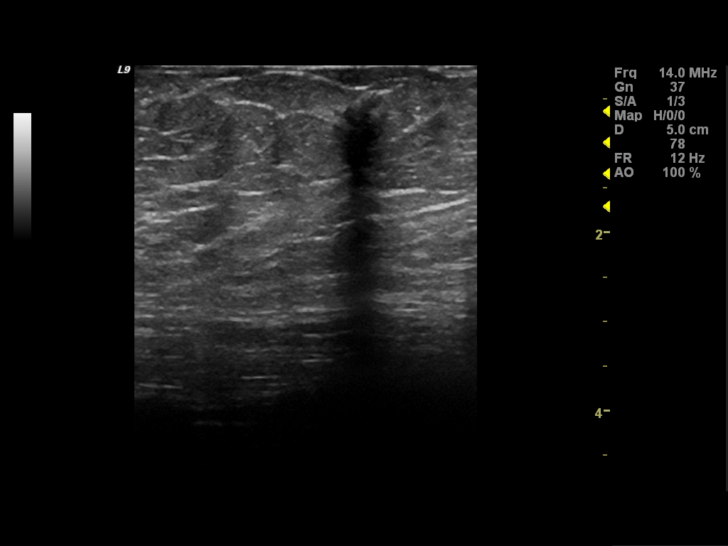
[im 10/14]
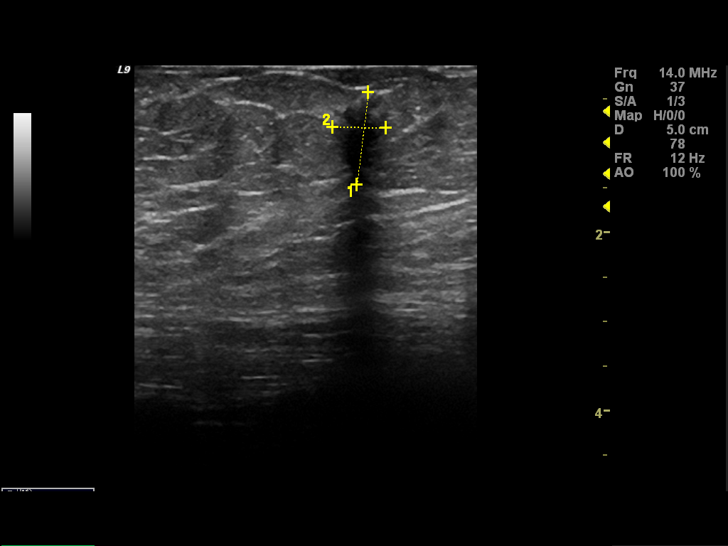
[im 11/14]
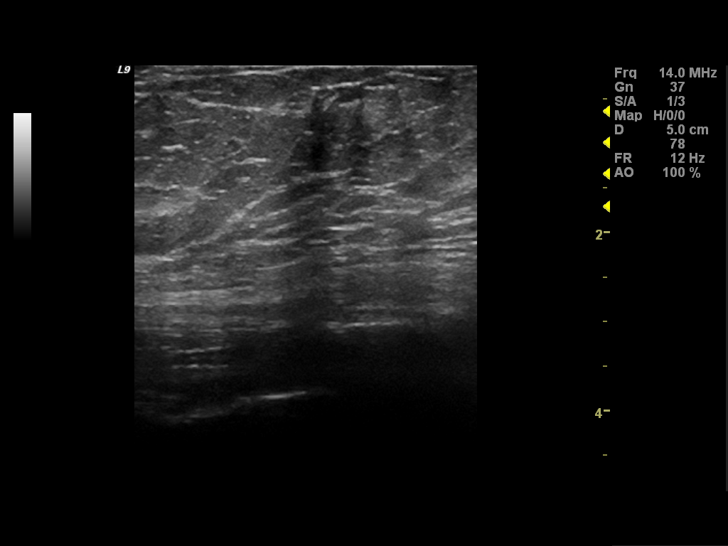
[im 12/14]
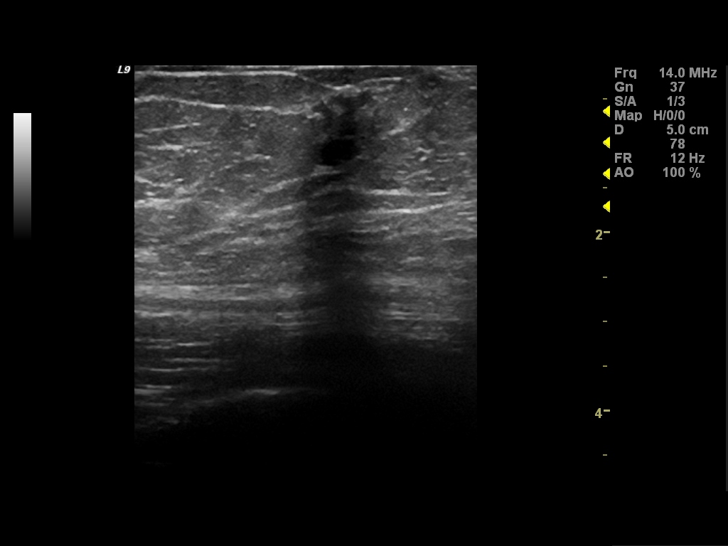
[im 13/14]
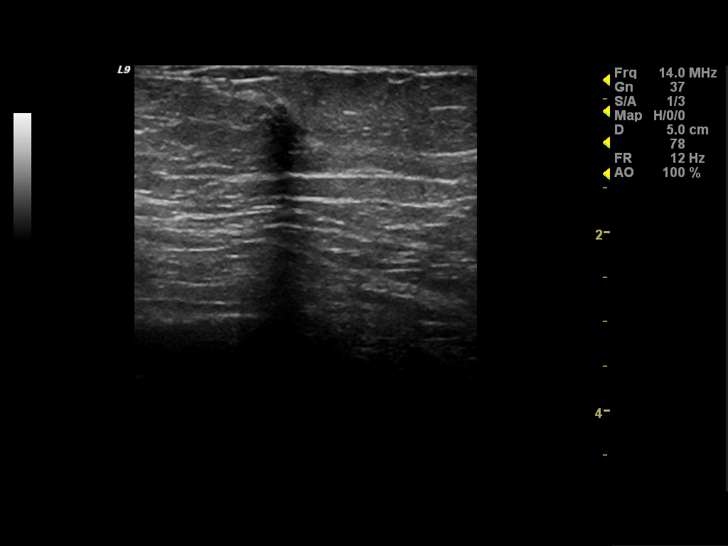
[im 14/14]
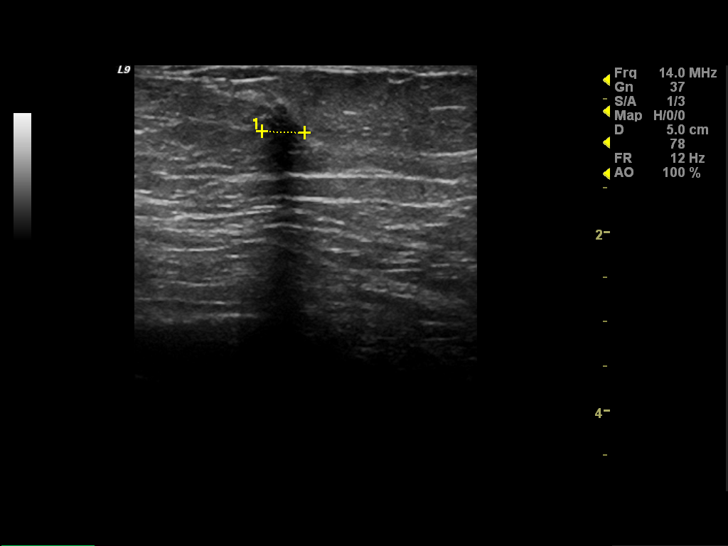

[13 of 14 positions shown; findings below may reference images not displayed]

FINDINGS: ACR Breast Density Category 1: The breast tissue is almost entirely
fatty.

Spot compression views of the right breast confirm an oval mass in
the outer portion of the breast.  Some of the margins are smooth
and well-defined and others are poorly defined.

On physical exam, no mass is palpable in the outer right breast or
right axilla.

Ultrasound is performed, showing a 1.0 x 0.6 x 0.5 cm markedly
hypoechoic mass with dense posterior acoustical shadowing in the 9
o'clock position of the right breast, 3 cm from the nipple.  The
mass has irregular margins with mild surrounding increased
echogenicity anteriorly.  This corresponds to the mammographic
mass.

Normal appearing lymph nodes were demonstrated throughout the right
axilla.
IMPRESSION: 1.0 x 0.6 x 0.5 cm irregular, hypoechoic mass in the 9 o'clock
position of the right breast, 3 cm from the nipple.  This has
imaging features suspicious for malignancy.  Therefore, ultrasound
guided core needle biopsy is recommended.  This was discussed with
the patient and scheduled for [DATE] p.m. on [DATE].

RECOMMENDATION:
Right breast ultrasound guided core needle biopsy (scheduled).

I have discussed the findings and recommendations with the patient.
Results were also provided in writing at the conclusion of the
visit.  If applicable, a reminder letter will be sent to the
patient regarding the next appointment.

BI-RADS CATEGORY 4:  Suspicious abnormality - biopsy should be
considered.

## 2013-01-16 IMAGING — MG MM DIGITAL DIAGNOSTIC UNILAT*R*
2 series · 2 of 2 positions shown · non-contrast
Comparison: Previous examinations, including the screening
mammogram at [HOSPITAL] [facility] on [DATE].

CLINICAL DATA: Possible right breast mass at recent screening
mammography.

DIGITAL DIAGNOSTIC RIGHT MAMMOGRAM  AND RIGHT BREAST ULTRASOUND:

[R CC]
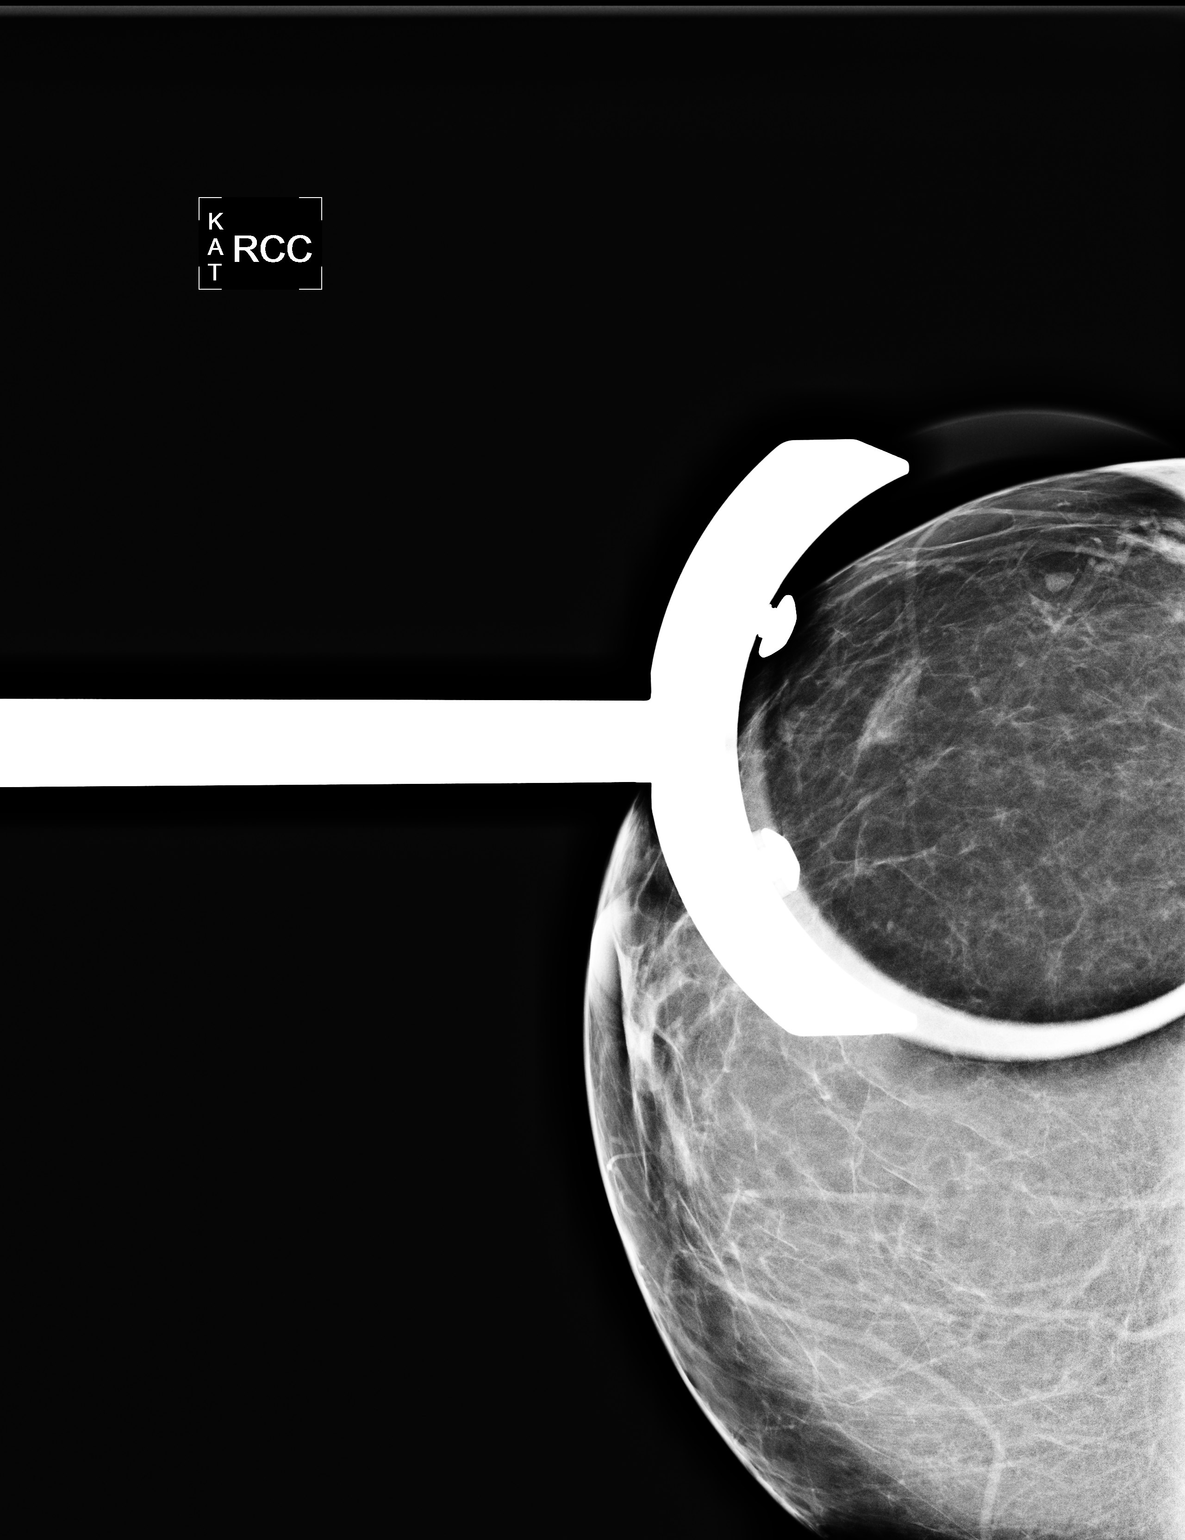

[R MLO]
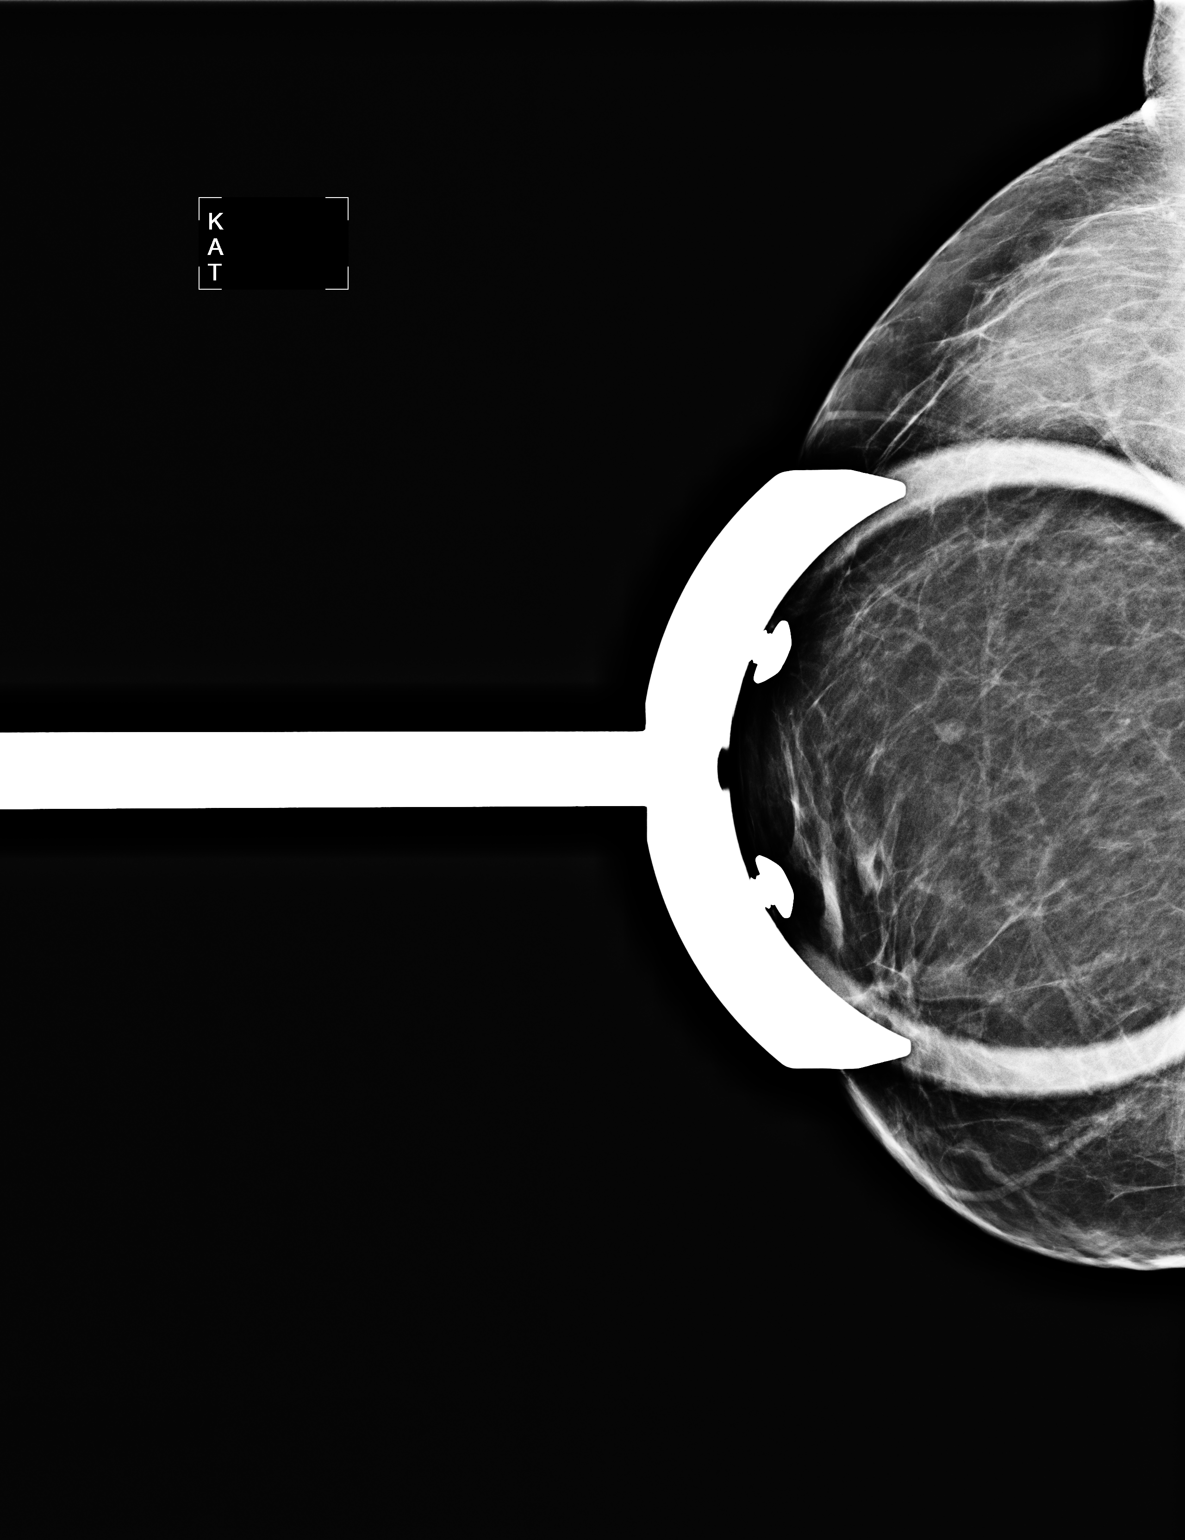

[2 of 2 positions shown; findings below may reference images not displayed]

FINDINGS: ACR Breast Density Category 1: The breast tissue is almost entirely
fatty.

Spot compression views of the right breast confirm an oval mass in
the outer portion of the breast.  Some of the margins are smooth
and well-defined and others are poorly defined.

On physical exam, no mass is palpable in the outer right breast or
right axilla.

Ultrasound is performed, showing a 1.0 x 0.6 x 0.5 cm markedly
hypoechoic mass with dense posterior acoustical shadowing in the 9
o'clock position of the right breast, 3 cm from the nipple.  The
mass has irregular margins with mild surrounding increased
echogenicity anteriorly.  This corresponds to the mammographic
mass.

Normal appearing lymph nodes were demonstrated throughout the right
axilla.
IMPRESSION: 1.0 x 0.6 x 0.5 cm irregular, hypoechoic mass in the 9 o'clock
position of the right breast, 3 cm from the nipple.  This has
imaging features suspicious for malignancy.  Therefore, ultrasound
guided core needle biopsy is recommended.  This was discussed with
the patient and scheduled for [DATE] p.m. on [DATE].

RECOMMENDATION:
Right breast ultrasound guided core needle biopsy (scheduled).

I have discussed the findings and recommendations with the patient.
Results were also provided in writing at the conclusion of the
visit.  If applicable, a reminder letter will be sent to the
patient regarding the next appointment.

BI-RADS CATEGORY 4:  Suspicious abnormality - biopsy should be
considered.

## 2013-01-18 ENCOUNTER — Ambulatory Visit
Admission: RE | Admit: 2013-01-18 | Discharge: 2013-01-18 | Disposition: A | Discharge status: Home / Self Care | Payer: BC Managed Care – PPO | Source: Ambulatory Visit | Attending: Obstetrics and Gynecology | Admitting: Obstetrics and Gynecology

## 2013-01-18 ENCOUNTER — Other Ambulatory Visit: Payer: Self-pay | Admitting: Obstetrics and Gynecology

## 2013-01-18 DIAGNOSIS — R928 Other abnormal and inconclusive findings on diagnostic imaging of breast: Secondary | ICD-10-CM

## 2013-01-18 IMAGING — US US RT BREAST BX
1 series · 12 of 12 positions shown · non-contrast
Comparison: none

CLINICAL DATA: 1.0 cm mass in the 9 o'clock position of the right
breast at recent mammography and ultrasound, suspicious for
malignancy.

[Series 1: us right breast bx · 12 of 12 slices shown]
[im 1/12]
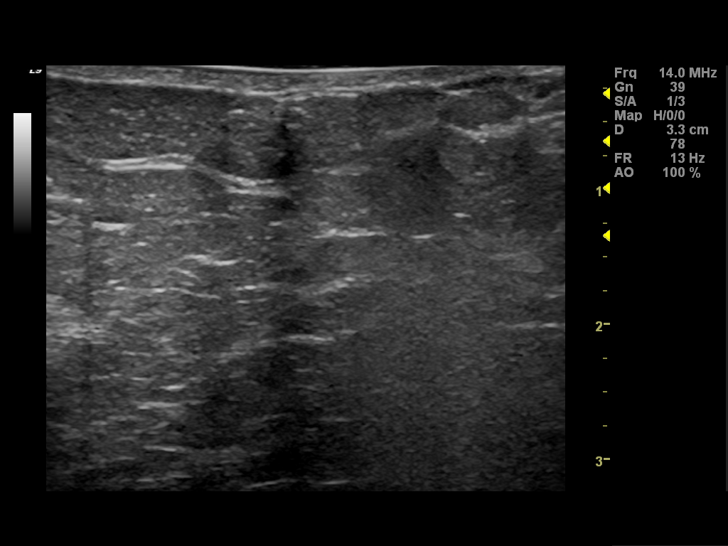
[im 2/12]
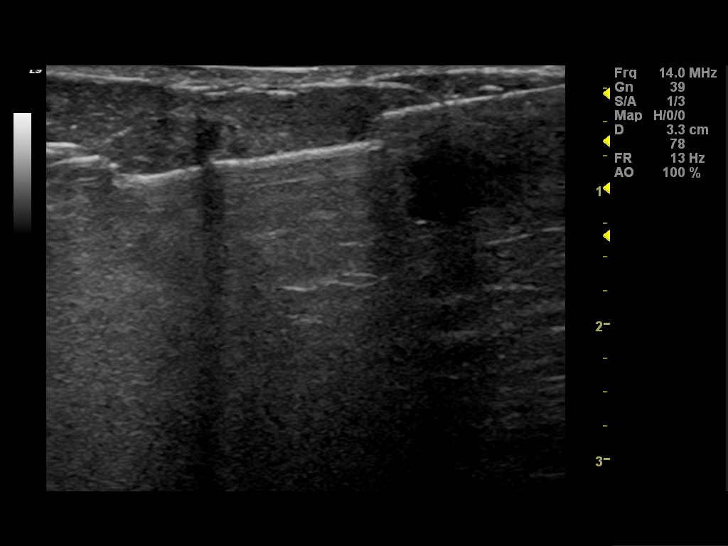
[im 3/12]
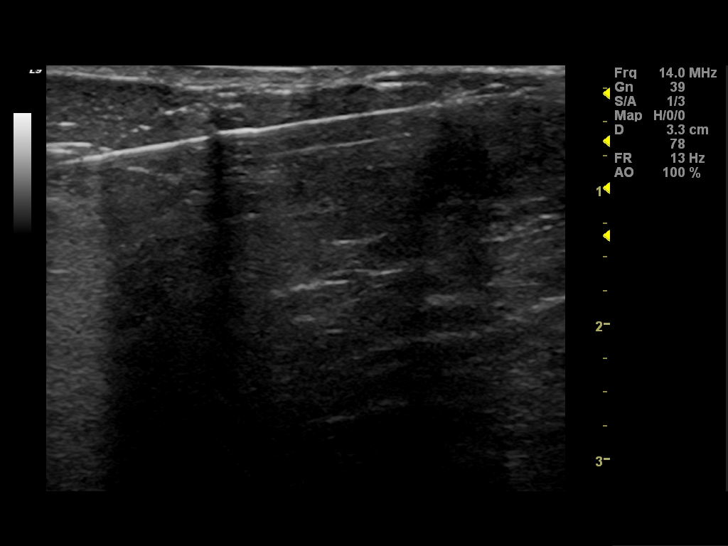
[im 4/12]
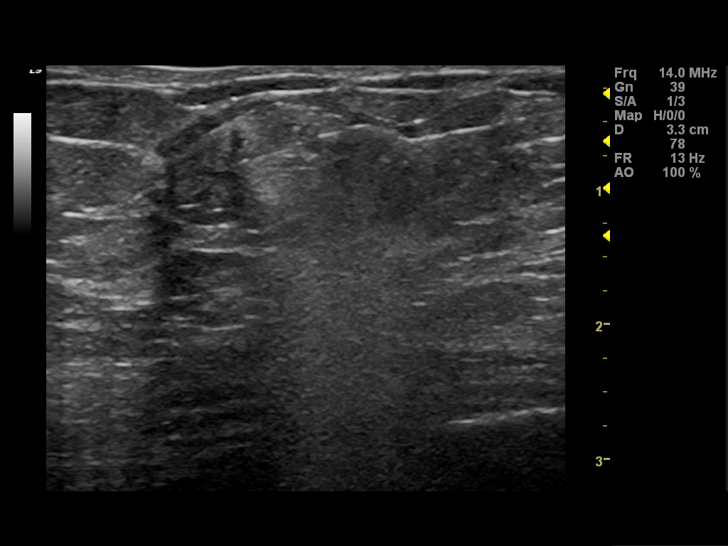
[im 5/12]
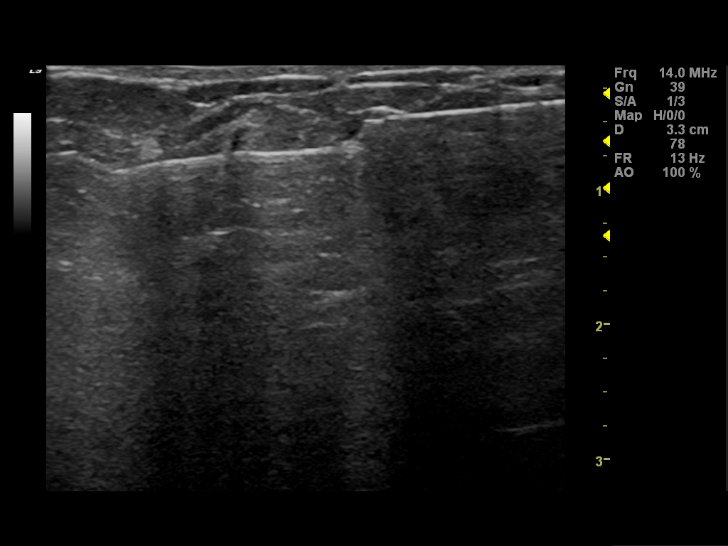
[im 6/12]
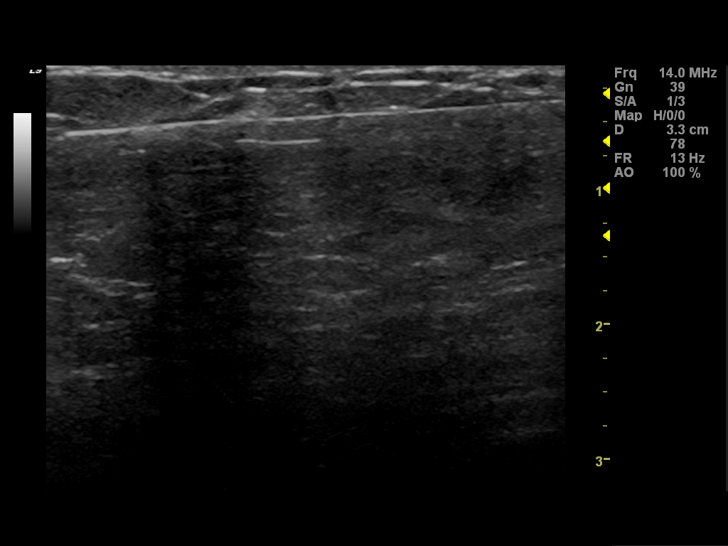
[im 7/12]
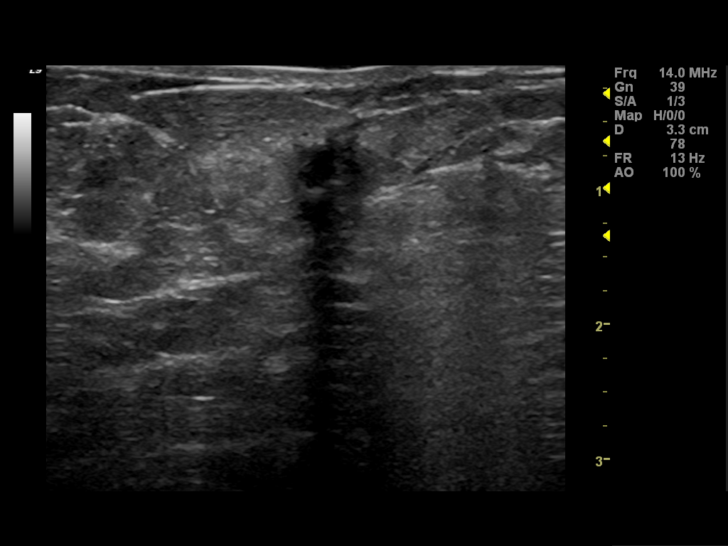
[im 8/12]
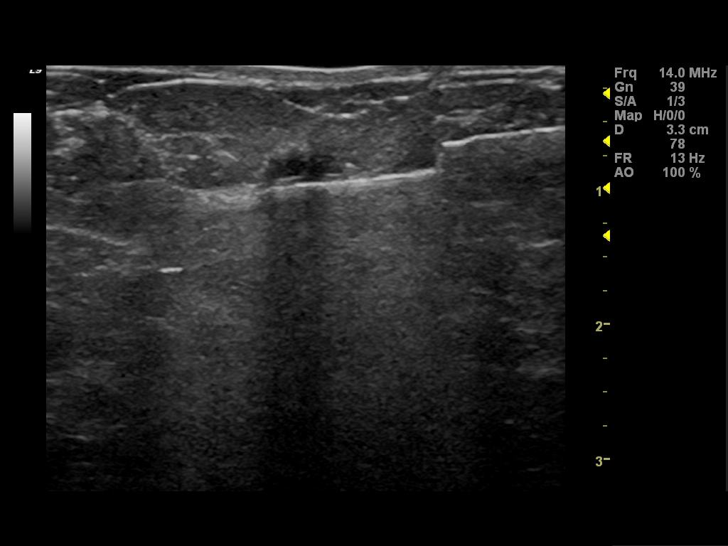
[im 9/12]
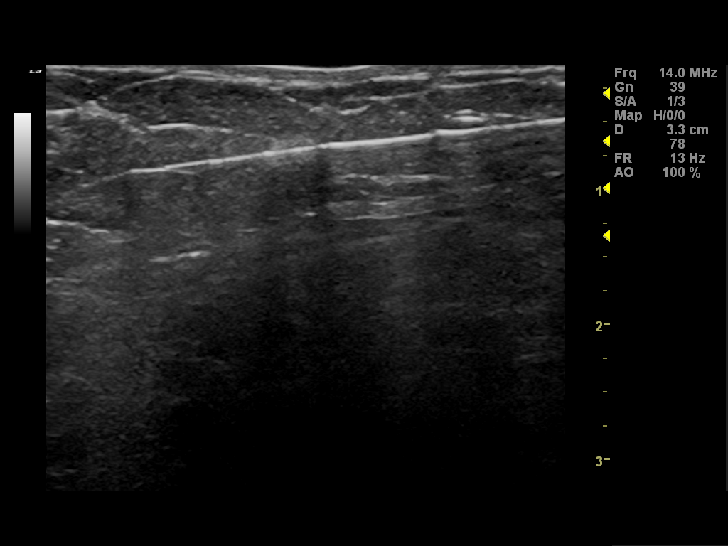
[im 10/12]
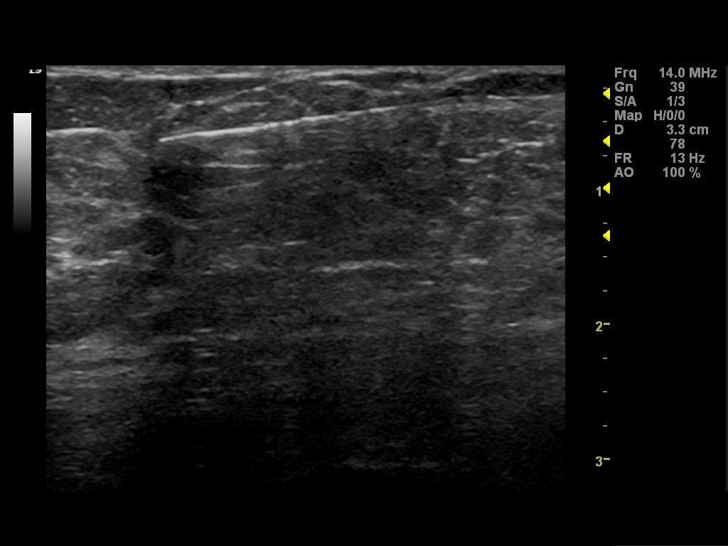
[im 11/12]
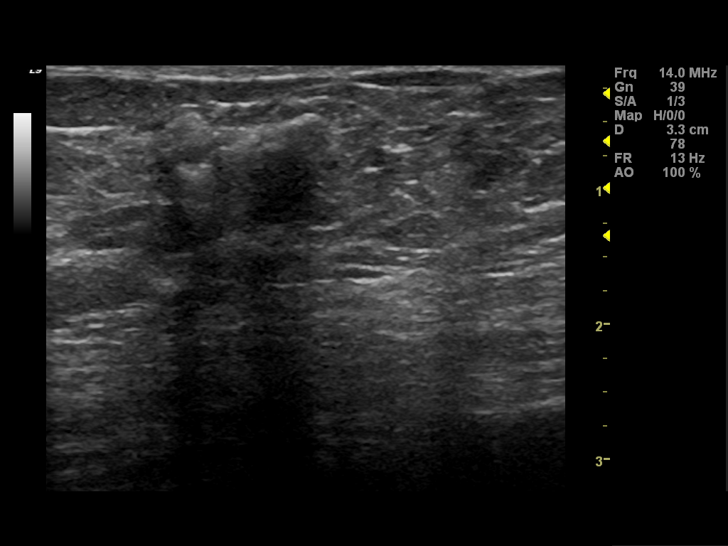
[im 12/12]
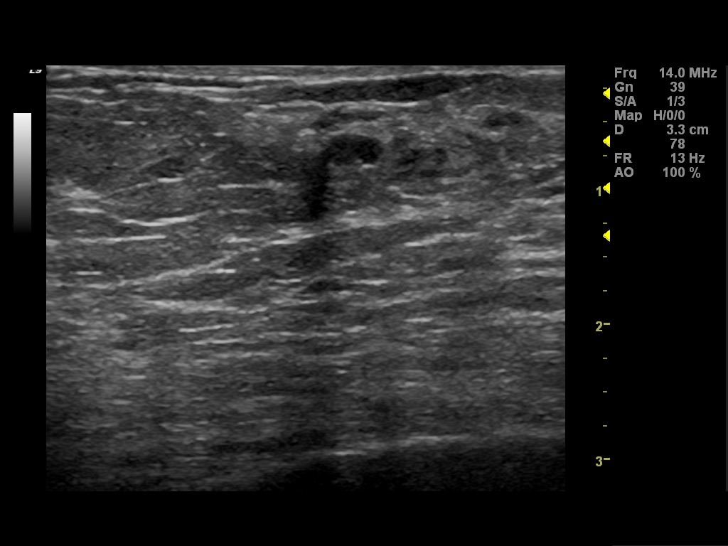

[12 of 12 positions shown; findings below may reference images not displayed]

ULTRASOUND GUIDED VACUUM ASSISTED CORE BIOPSY OF THE RIGHT BREAST

I met with the patient, and we discussed the procedure of
ultrasound-guided biopsy, including risks.  Specifically, we
discussed the risks of bleeding, infection and clip migration.  We
discussed the high likelihood of a successful procedure.  Informed,
written consent was given.

Using sterile technique, local anesthesia, ultrasound guidance, and
a 12 gauge vacuum assisted needle, biopsy was performed of the
recently demonstrated 1.0 cm mass in the 9 o'clock position of the
right breast, 3 cm from the nipple.  On pre biopsy ultrasound
today, this was less mass-like and more of a vague hypoechoic area
producing posterior acoustical shadowing.  An inferior approach was
used.  At the conclusion of the procedure, a tissue marker clip was
deployed into the biopsy cavity.  A follow-up 2-view mammogram was
performed and dictated separately.
IMPRESSION: Ultrasound-guided biopsy of a 1.0 cm 9 o'clock right breast mass..
No apparent complications.

## 2013-01-18 IMAGING — MG MM DIAGNOSTIC UNILATERAL R
1 series · 1 of 1 positions shown · non-contrast
Comparison: Previous examinations.

CLINICAL DATA: Titanium marker placement following ultrasound
guided core needle biopsy earlier today.

DIGITAL DIAGNOSTIC RIGHT MAMMOGRAM

[R ML]
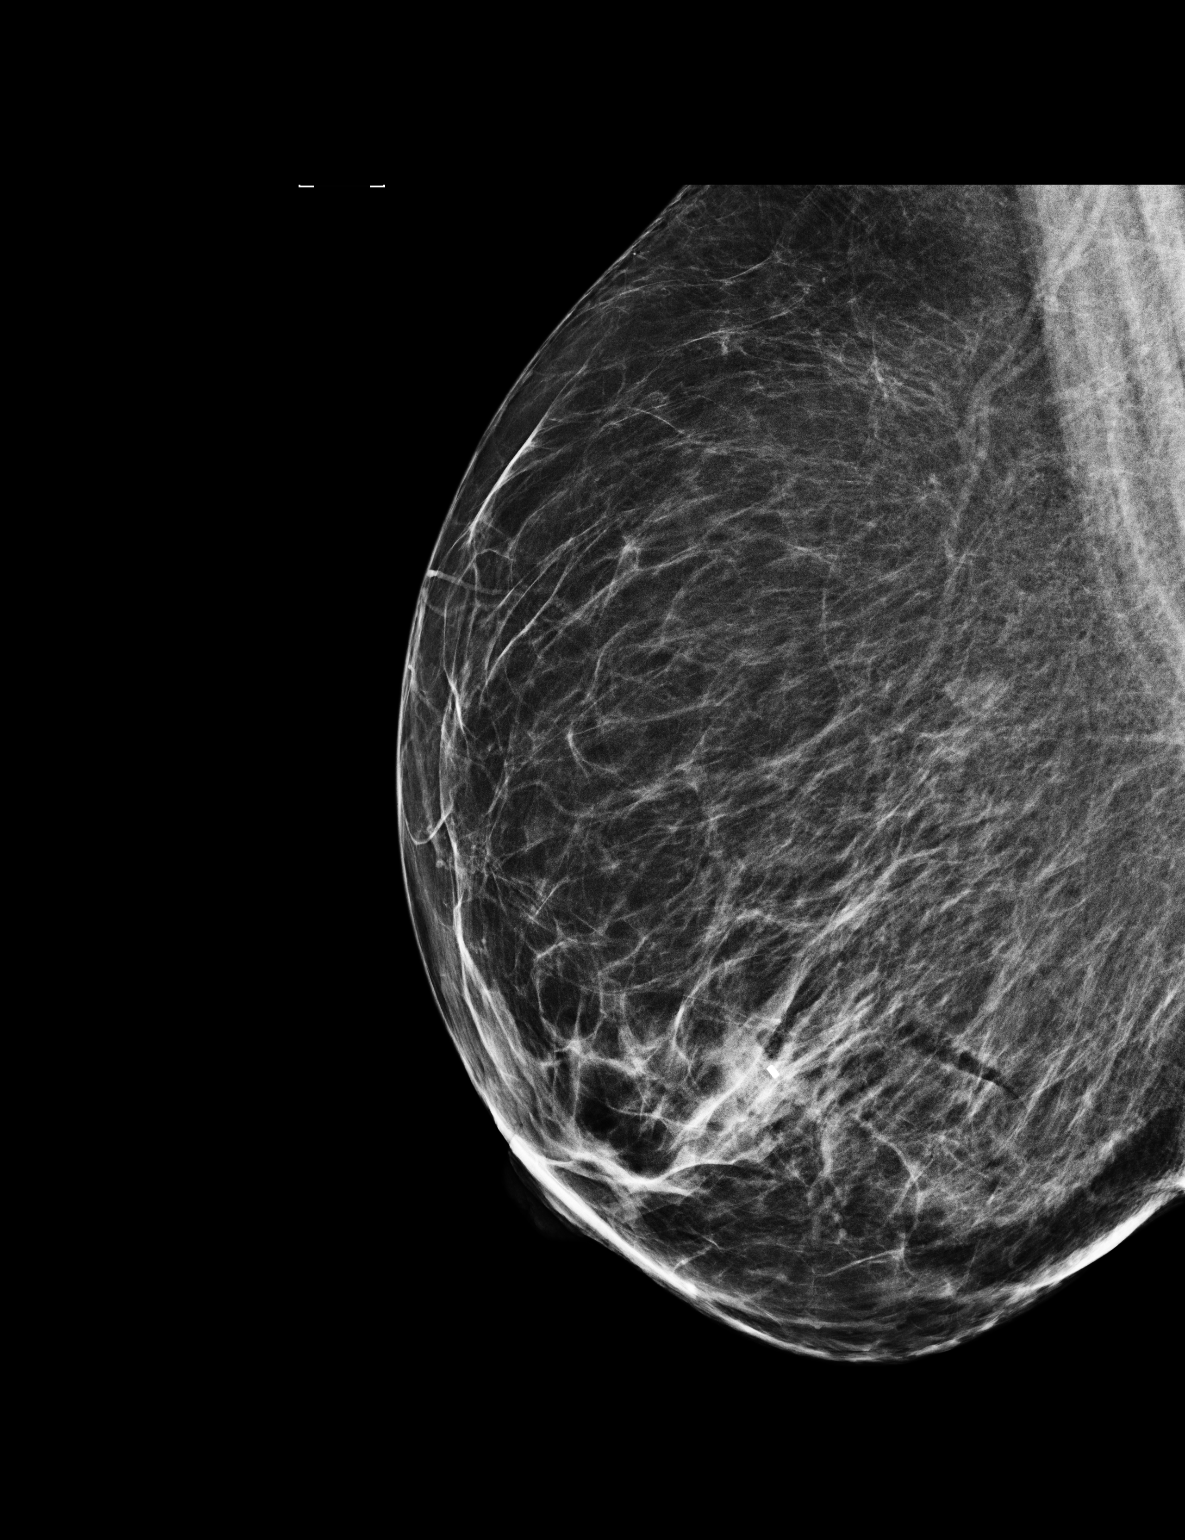

[1 of 1 positions shown; findings below may reference images not displayed]

FINDINGS: Digital mammographic images were performed following
ultrasound guided biopsy of a 1.0 cm mass with posterior acoustical
shadowing in the 9 o'clock position of the right breast, 3 cm from
the nipple.  These demonstrate a cylinder shaped biopsy marker clip
at the location of the biopsied mass with mild adjacent postbiopsy
parenchymal hemorrhage.
IMPRESSION: Appropriate clip deployment following right breast ultrasound
guided core needle biopsy.

## 2013-01-21 ENCOUNTER — Ambulatory Visit
Admission: RE | Admit: 2013-01-21 | Discharge: 2013-01-21 | Disposition: A | Discharge status: Home / Self Care | Payer: BC Managed Care – PPO | Source: Ambulatory Visit | Attending: Obstetrics and Gynecology | Admitting: Obstetrics and Gynecology

## 2013-01-21 ENCOUNTER — Other Ambulatory Visit: Payer: Self-pay | Admitting: Obstetrics and Gynecology

## 2013-01-21 DIAGNOSIS — Z9889 Other specified postprocedural states: Secondary | ICD-10-CM

## 2013-02-11 ENCOUNTER — Ambulatory Visit (INDEPENDENT_AMBULATORY_CARE_PROVIDER_SITE_OTHER): Payer: Self-pay | Admitting: Surgery

## 2013-02-11 ENCOUNTER — Encounter (INDEPENDENT_AMBULATORY_CARE_PROVIDER_SITE_OTHER): Payer: Self-pay | Admitting: Surgery

## 2013-02-11 ENCOUNTER — Ambulatory Visit (INDEPENDENT_AMBULATORY_CARE_PROVIDER_SITE_OTHER): Payer: BC Managed Care – PPO | Admitting: Surgery

## 2013-02-11 VITALS — BP 122/80 | HR 80 | Temp 98.1°F | Resp 14 | Ht 65.5 in | Wt 195.0 lb

## 2013-02-11 DIAGNOSIS — N6310 Unspecified lump in the right breast, unspecified quadrant: Secondary | ICD-10-CM

## 2013-02-11 DIAGNOSIS — N63 Unspecified lump in unspecified breast: Secondary | ICD-10-CM

## 2013-02-11 NOTE — Patient Instructions (Signed)
Lumpectomy, Breast Conserving Surgery A lumpectomy is breast surgery that removes only part of the breast. Another name used may be partial mastectomy. The amount removed varies. Make sure you understand how much of your breast will be removed. Reasons for a lumpectomy:  Any solid breast mass.  Grouped significant nodularity that may be confused with a solitary breast mass. Lumpectomy is the most common form of breast cancer surgery today. The surgeon removes the portion of your breast which contains the tumor (cancer). This is the lump. Some normal tissue around the lump is also removed to be sure that all the tumor has been removed.  If cancer cells are found in the margins where the breast tissue was removed, your surgeon will do more surgery to remove the remaining cancer tissue. This is called re-excision surgery. Radiation and/or chemotherapy treatments are often given following a lumpectomy to kill any cancer cells that could possibly remain.  REASONS YOU MAY NOT BE ABLE TO HAVE BREAST CONSERVING SURGERY:  The tumor is located in more than one place.  Your breast is small and the tumor is large so the breast would be disfigured.  The entire tumor removal is not successful with a lumpectomy.  You cannot commit to a full course of chemotherapy, radiation therapy or are pregnant and cannot have radiation.  You have previously had radiation to the breast to treat cancer. HOW A LUMPECTOMY IS PERFORMED If overnight nursing is not required following a biopsy, a lumpectomy can be performed as a same-day surgery. This can be done in a hospital, clinic, or surgical center. The anesthesia used will depend on your surgeon. They will discuss this with you. A general anesthetic keeps you sleeping through the procedure. LET YOUR CAREGIVERS KNOW ABOUT THE FOLLOWING:  Allergies  Medications taken including herbs, eye drops, over the counter medications, and creams.  Use of steroids (by mouth or  creams)  Previous problems with anesthetics or Novocaine.  Possibility of pregnancy, if this applies  History of blood clots (thrombophlebitis)  History of bleeding or blood problems.  Previous surgery  Other health problems BEFORE THE PROCEDURE You should be present one hour prior to your procedure unless directed otherwise.  AFTER THE PROCEDURE  After surgery, you will be taken to the recovery area where a nurse will watch and check your progress. Once you're awake, stable, and taking fluids well, barring other problems you will be allowed to go home.  Ice packs applied to your operative site may help with discomfort and keep the swelling down.  A small rubber drain may be placed in the breast for a couple of days to prevent a hematoma from developing in the breast.  A pressure dressing may be applied for 24 to 48 hours to prevent bleeding.  Keep the wound dry.  You may resume a normal diet and activities as directed. Avoid strenuous activities affecting the arm on the side of the biopsy site such as tennis, swimming, heavy lifting (more than 10 pounds) or pulling.  Bruising in the breast is normal following this procedure.  Wearing a bra - even to bed - may be more comfortable and also help keep the dressing on.  Change dressings as directed.  Only take over-the-counter or prescription medicines for pain, discomfort, or fever as directed by your caregiver. Call for your results as instructed by your surgeon. Remember it is your responsibility to get the results of your lumpectomy if your surgeon asked you to follow-up. Do not assume   everything is fine if you have not heard from your caregiver. SEEK MEDICAL CARE IF:   There is increased bleeding (more than a small spot) from the wound.  You notice redness, swelling, or increasing pain in the wound.  Pus is coming from wound.  An unexplained oral temperature above 102 F (38.9 C) develops.  You notice a foul smell  coming from the wound or dressing. SEEK IMMEDIATE MEDICAL CARE IF:   You develop a rash.  You have difficulty breathing.  You have any allergic problems. Document Released: 09/12/2006 Document Revised: 10/24/2011 Document Reviewed: 12/14/2006 [employer] Patient Information 2014 [employer].  

## 2013-02-11 NOTE — Progress Notes (Signed)
Patient ID: Angela Miles, female   DOB: 1970-12-13, 42 y.o.   MRN: [id number]  Chief Complaint  Patient presents with  . New Evaluation    eval br mass    HPI Angela Miles is a 42 y.o. female.  Patient sent at the request of Dr. Tenny Craw her abnormal mammogram. She underwent a mammogram which showed a mammographic abnormality in the right upper-outer quadrant. Core biopsy showed fibroma adenomatoid change but this was felt to be discordant and excision was recommended. Patient denies any previous history of breast mass, breast pain or nipple discharge. She underwent cryosurgery for abnormal Pap smear recently. No history of breast cancer. HPI  Past Medical History  Diagnosis Date  . Anemia   . Cancer     Past Surgical History  Procedure Laterality Date  . Cesarean section    . Cholecystectomy    . Gastric bypass    . Cervical biopsy      Family History  Problem Relation Age of Onset  . Cancer Maternal Uncle     colon    Social History History  Substance Use Topics  . Smoking status: Former Smoker    Quit date: 02/11/2009  . Smokeless tobacco: Never Used  . Alcohol Use: No    Allergies  Allergen Reactions  . Penicillins Nausea Only    Current Outpatient Prescriptions  Medication Sig Dispense Refill  . clarithromycin (BIAXIN) 500 MG tablet       . metroNIDAZOLE (METROGEL) 0.75 % vaginal gel       . PROAIR HFA 108 (90 BASE) MCG/ACT inhaler       . promethazine-codeine (PHENERGAN WITH CODEINE) 6.25-10 MG/5ML syrup       . rOPINIRole (REQUIP) 1 MG tablet        No current facility-administered medications for this visit.    Review of Systems Review of Systems  Constitutional: Negative for fever, chills and unexpected weight change.  HENT: Negative for hearing loss, congestion, sore throat, trouble swallowing and voice change.   Eyes: Negative for visual disturbance.  Respiratory: Negative for cough and wheezing.   Cardiovascular: Negative for chest pain,  palpitations and leg swelling.  Gastrointestinal: Negative for nausea, vomiting, abdominal pain, diarrhea, constipation, blood in stool, abdominal distention and anal bleeding.  Genitourinary: Negative for hematuria, vaginal bleeding and difficulty urinating.  Musculoskeletal: Negative for arthralgias.  Skin: Negative for rash and wound.  Neurological: Negative for seizures, syncope and headaches.  Hematological: Negative for adenopathy. Does not bruise/bleed easily.  Psychiatric/Behavioral: Negative for confusion.    Blood pressure 122/80, pulse 80, temperature 98.1 F (36.7 C), temperature source Temporal, resp. rate 14, height 5' 5.5" (1.664 m), weight 195 lb (88.451 kg), last menstrual period 01/15/2013.  Physical Exam Physical Exam  Constitutional: She is oriented to person, place, and time. She appears well-developed and well-nourished.  HENT:  Head: Normocephalic and atraumatic.  Eyes: EOM are normal. Pupils are equal, round, and reactive to light.  Neck: Normal range of motion. Neck supple.  Cardiovascular: Normal rate and regular rhythm.   Pulmonary/Chest: Effort normal and breath sounds normal. Right breast exhibits no inverted nipple, no mass, no nipple discharge, no skin change and no tenderness. Left breast exhibits no inverted nipple, no mass, no nipple discharge, no skin change and no tenderness. Breasts are symmetrical.  Musculoskeletal: Normal range of motion.  Neurological: She is alert and oriented to person, place, and time.  Skin: Skin is warm and dry.  Psychiatric: She has a normal  mood and affect. Her behavior is normal. Judgment and thought content normal.    Data Reviewed ESTABLISHED PATIENT OFFICE VISIT - LEVEL II ([id number])  Chief Complaint: Right breast bruising following right breast  ultrasound guided core needle biopsy.  History: Status post right breast ultrasound guided core needle  biopsy on 01/18/2013. The patient complains of bruising at the  biopsy  site without pain today.  Exam: The patient has an approximately 7 cm rounded area of  ecchymosis in the inferior right breast at the biopsy site with no  palpable hematoma, tenderness or increased warmth.  Pathology: Benign breast parenchyma with focal fibroadenomatoid  change. No hyperplasia, atypia or malignancy identified.  Assessment and Plan: The pathological findings are not concordant  with the imaging findings. Therefore, mammographic or ultrasound  guided needle localization and surgical excision of the area of  concern in the 9 o'clock position of the right breast is  recommended. The patient will consult with Dr. Tenny Craw for surgical  referral.  Original Report Authenticated By: Beckie Salts, M.D.   Assessment    Right breast mass status post core biopsy with discordant results    Plan    Recommend excision right breast mass.The procedure has been discussed with the patient. Alternatives to surgery have been discussed with the patient.  Risks of surgery include bleeding,  Infection,  Seroma formation, death,   Cosmetic deformity and the need for further surgery.   Benefits and long term expectations discussed. Risk of malignancy 20% in this circumstance. The patient understands and wishes to proceed.       Livingston Denner A. 02/11/2013, 9:57 AM

## 2013-02-22 ENCOUNTER — Encounter (HOSPITAL_BASED_OUTPATIENT_CLINIC_OR_DEPARTMENT_OTHER): Payer: Self-pay | Admitting: *Deleted

## 2013-02-22 ENCOUNTER — Encounter (HOSPITAL_BASED_OUTPATIENT_CLINIC_OR_DEPARTMENT_OTHER)
Admission: RE | Admit: 2013-02-22 | Discharge: 2013-02-22 | Disposition: A | Discharge status: Home / Self Care | Payer: BC Managed Care – PPO | Source: Ambulatory Visit | Attending: Surgery | Admitting: Surgery

## 2013-02-22 LAB — CBC WITH DIFFERENTIAL/PLATELET
Eosinophils Absolute: 0.2 10*3/uL (ref 0.0–0.7)
Eosinophils Relative: 2 % (ref 0–5)
Lymphs Abs: 1.6 10*3/uL (ref 0.7–4.0)
MCH: 27.4 pg (ref 26.0–34.0)
MCHC: 32.1 g/dL (ref 30.0–36.0)
MCV: 85.5 fL (ref 78.0–100.0)
Platelets: 238 10*3/uL (ref 150–400)
RBC: 3.72 MIL/uL — ABNORMAL LOW (ref 3.87–5.11)
RDW: 14.4 % (ref 11.5–15.5)

## 2013-02-22 LAB — COMPREHENSIVE METABOLIC PANEL
ALT: 16 U/L (ref 0–35)
Calcium: 8.8 mg/dL (ref 8.4–10.5)
GFR calc Af Amer: >90 mL/min (ref >90)
Glucose, Bld: 105 mg/dL — ABNORMAL HIGH (ref 70–99)
Sodium: 137 mEq/L (ref 135–145)
Total Protein: 6.7 g/dL (ref 6.0–8.3)

## 2013-02-22 NOTE — Progress Notes (Signed)
To come in for [facility] labs  

## 2013-02-25 NOTE — Progress Notes (Signed)
Labs reviewed by Dr Chaney Malling. No new order.

## 2013-02-26 ENCOUNTER — Ambulatory Visit
Admission: RE | Admit: 2013-02-26 | Discharge: 2013-02-26 | Disposition: A | Discharge status: Home / Self Care | Payer: BC Managed Care – PPO | Source: Ambulatory Visit | Attending: Surgery | Admitting: Surgery

## 2013-02-26 ENCOUNTER — Encounter (HOSPITAL_BASED_OUTPATIENT_CLINIC_OR_DEPARTMENT_OTHER)
Admission: RE | Disposition: A | Discharge status: Home / Self Care | Payer: Self-pay | Source: Ambulatory Visit | Attending: Surgery

## 2013-02-26 ENCOUNTER — Other Ambulatory Visit (INDEPENDENT_AMBULATORY_CARE_PROVIDER_SITE_OTHER): Payer: Self-pay | Admitting: Surgery

## 2013-02-26 ENCOUNTER — Encounter (HOSPITAL_BASED_OUTPATIENT_CLINIC_OR_DEPARTMENT_OTHER): Payer: Self-pay | Admitting: Anesthesiology

## 2013-02-26 ENCOUNTER — Ambulatory Visit (HOSPITAL_BASED_OUTPATIENT_CLINIC_OR_DEPARTMENT_OTHER): Payer: BC Managed Care – PPO | Admitting: Anesthesiology

## 2013-02-26 ENCOUNTER — Ambulatory Visit (HOSPITAL_BASED_OUTPATIENT_CLINIC_OR_DEPARTMENT_OTHER)
Admission: RE | Admit: 2013-02-26 | Discharge: 2013-02-26 | Disposition: A | Discharge status: Home / Self Care | Payer: BC Managed Care – PPO | Source: Ambulatory Visit | Attending: Surgery | Admitting: Surgery

## 2013-02-26 DIAGNOSIS — N6310 Unspecified lump in the right breast, unspecified quadrant: Secondary | ICD-10-CM

## 2013-02-26 DIAGNOSIS — Z87891 Personal history of nicotine dependence: Secondary | ICD-10-CM | POA: Insufficient documentation

## 2013-02-26 DIAGNOSIS — D249 Benign neoplasm of unspecified breast: Secondary | ICD-10-CM

## 2013-02-26 DIAGNOSIS — Z79899 Other long term (current) drug therapy: Secondary | ICD-10-CM | POA: Insufficient documentation

## 2013-02-26 DIAGNOSIS — J45909 Unspecified asthma, uncomplicated: Secondary | ICD-10-CM | POA: Insufficient documentation

## 2013-02-26 DIAGNOSIS — Z880 Allergy status to penicillin: Secondary | ICD-10-CM | POA: Insufficient documentation

## 2013-02-26 DIAGNOSIS — Z9889 Other specified postprocedural states: Secondary | ICD-10-CM

## 2013-02-26 DIAGNOSIS — Z859 Personal history of malignant neoplasm, unspecified: Secondary | ICD-10-CM | POA: Insufficient documentation

## 2013-02-26 DIAGNOSIS — D649 Anemia, unspecified: Secondary | ICD-10-CM | POA: Insufficient documentation

## 2013-02-26 HISTORY — PX: BREAST LUMPECTOMY WITH NEEDLE LOCALIZATION: SHX5759

## 2013-02-26 HISTORY — DX: Unspecified asthma, uncomplicated: J45.909

## 2013-02-26 LAB — POCT HEMOGLOBIN-HEMACUE: Hemoglobin: 11.4 g/dL — ABNORMAL LOW (ref 12.0–15.0)

## 2013-02-26 IMAGING — MG MM BREAST WIRE LOCALIZATION*R*
4 series · 4 of 4 positions shown · non-contrast
Comparison: Previous examinations including [DATE]

CLINICAL DATA: Right breast mass, localization for surgical
excision.

NEEDLE LOCALIZATION USING ULTRASOUND GUIDANCE AND SPECIMEN
RADIOGRAPH

[R CC (1 of 2)]
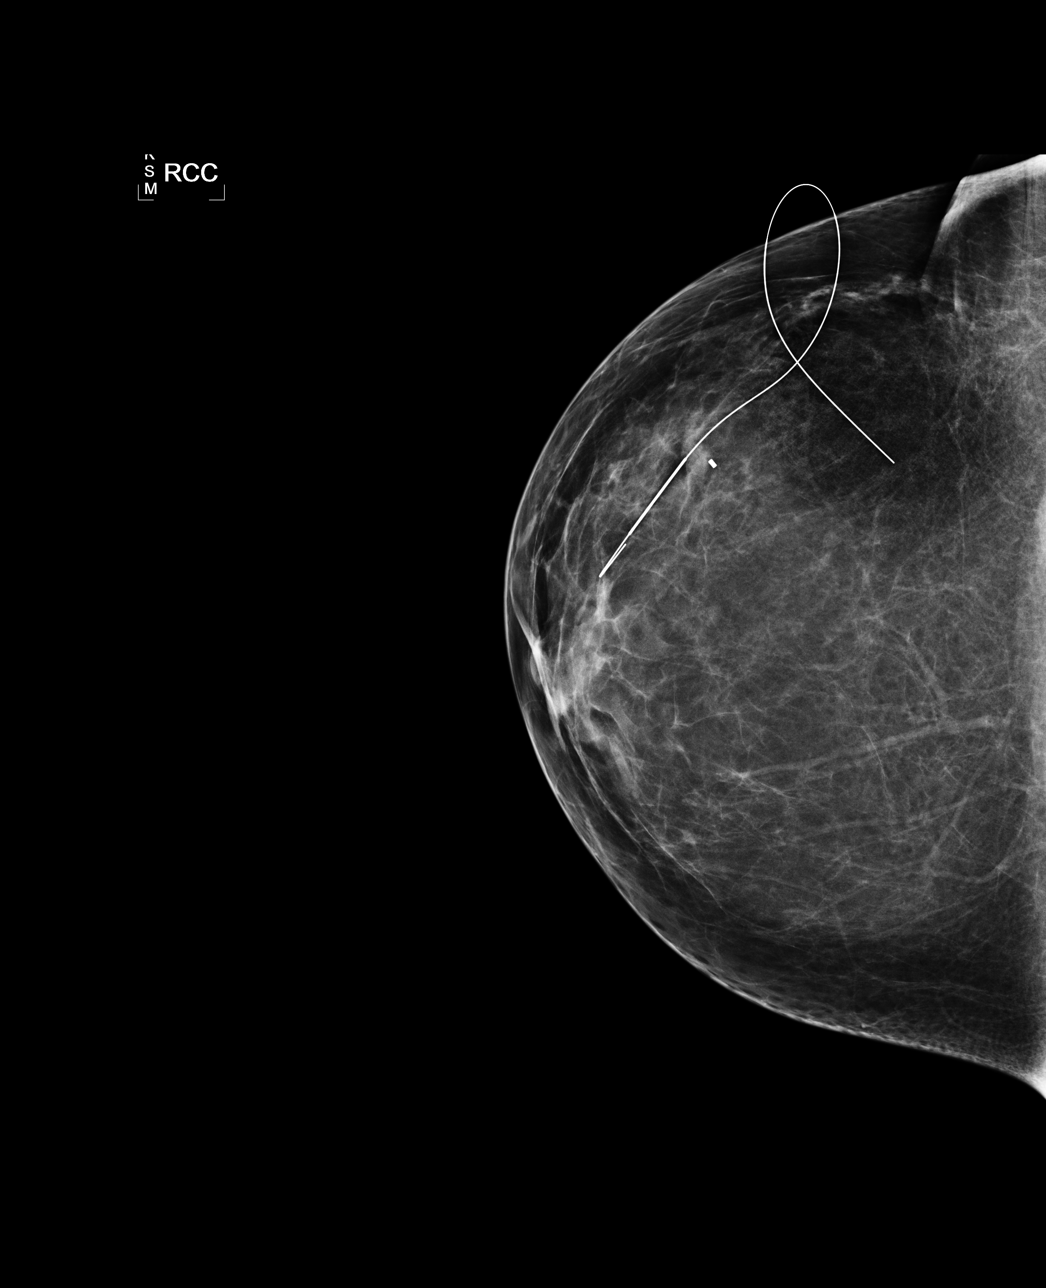

[R ML (1 of 2)]
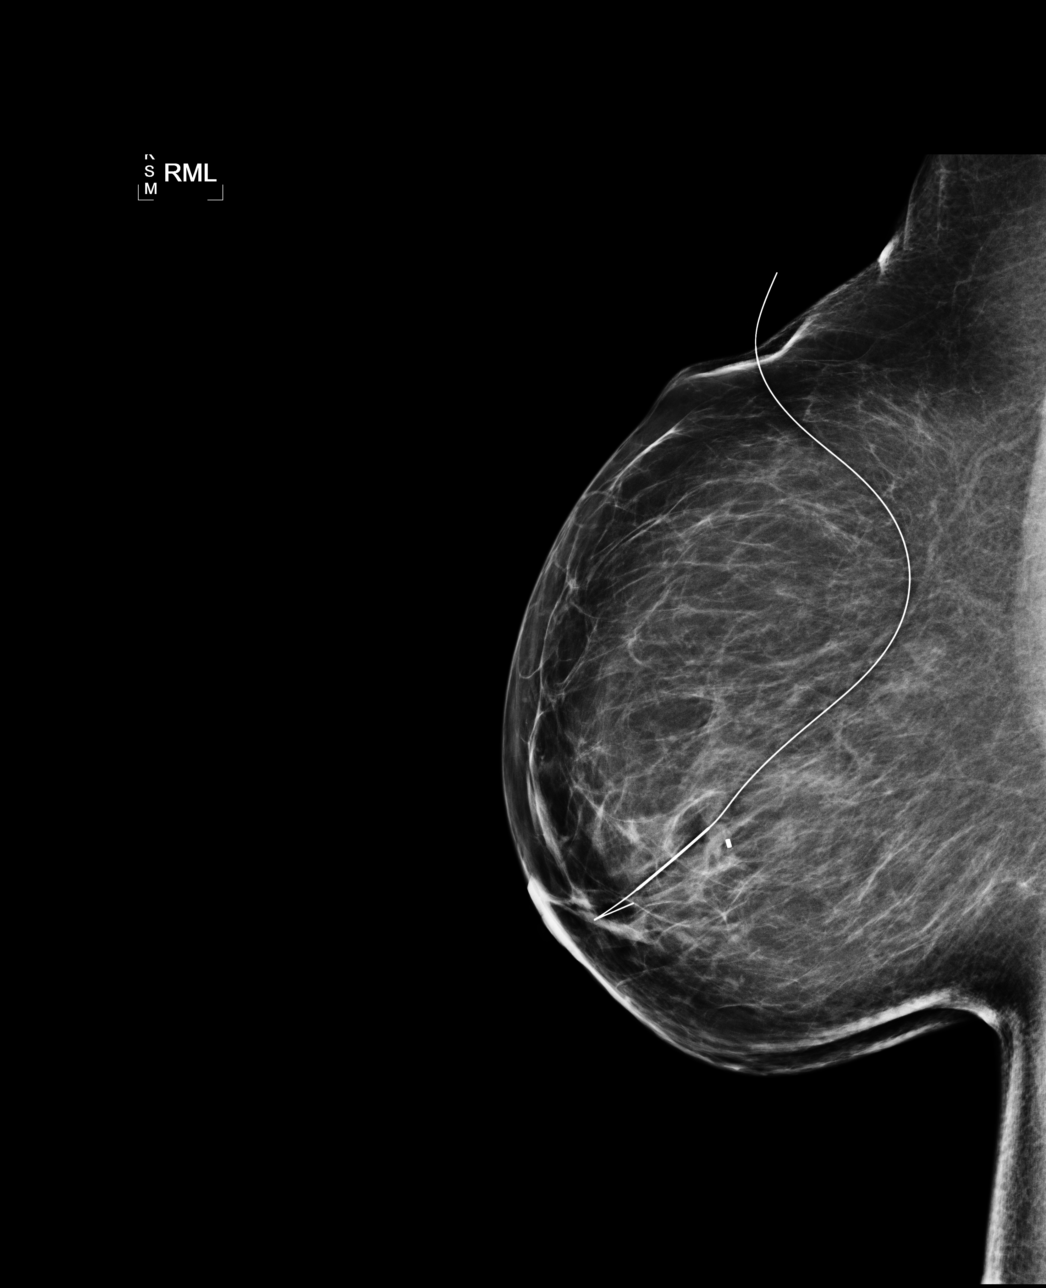

[R ML (2 of 2)]
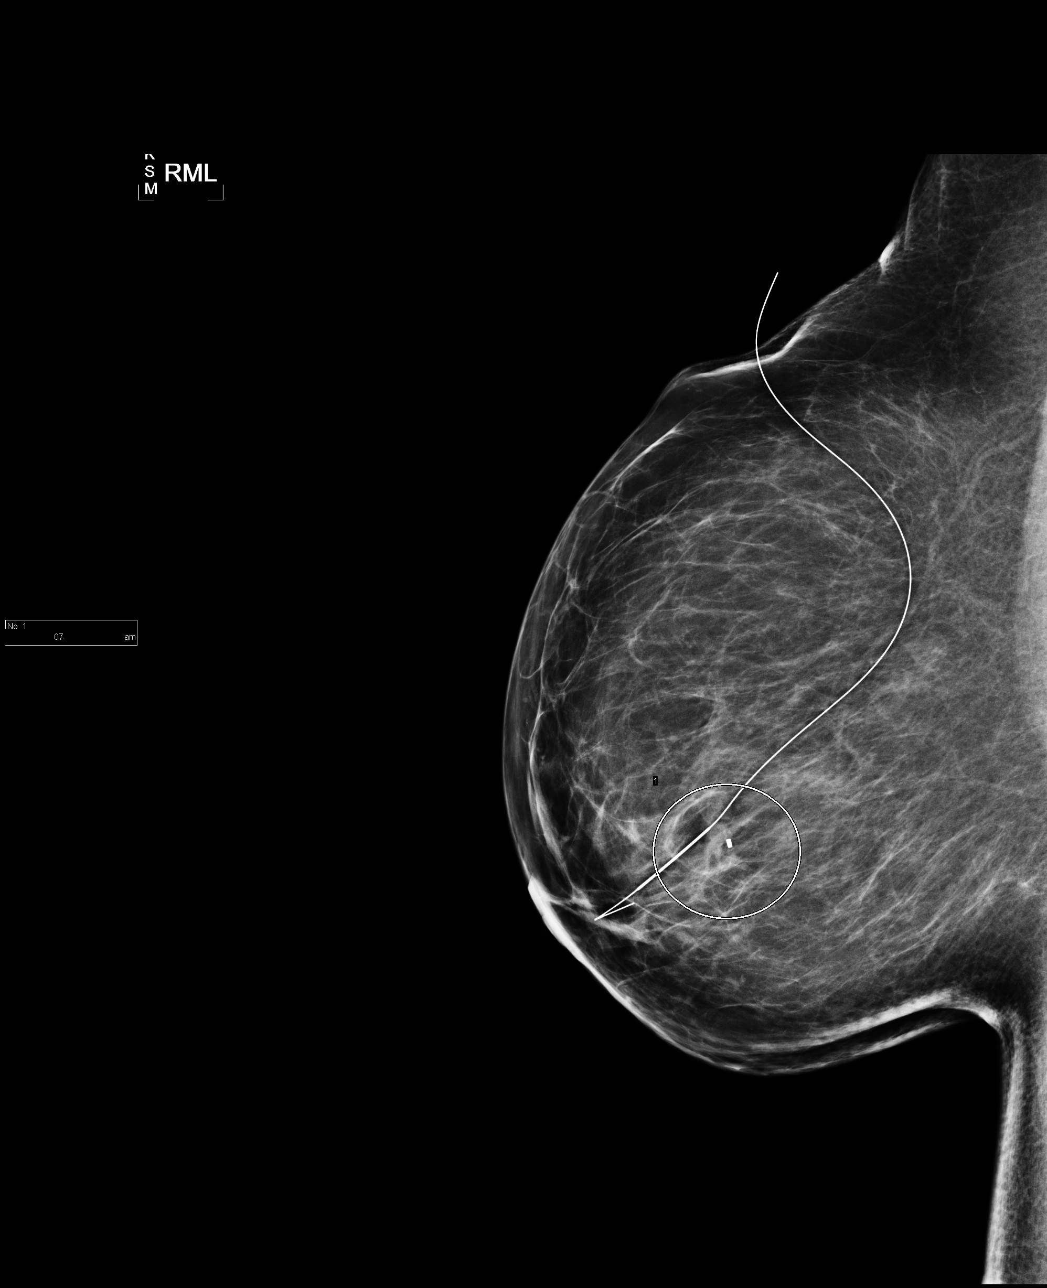

[R CC (2 of 2)]
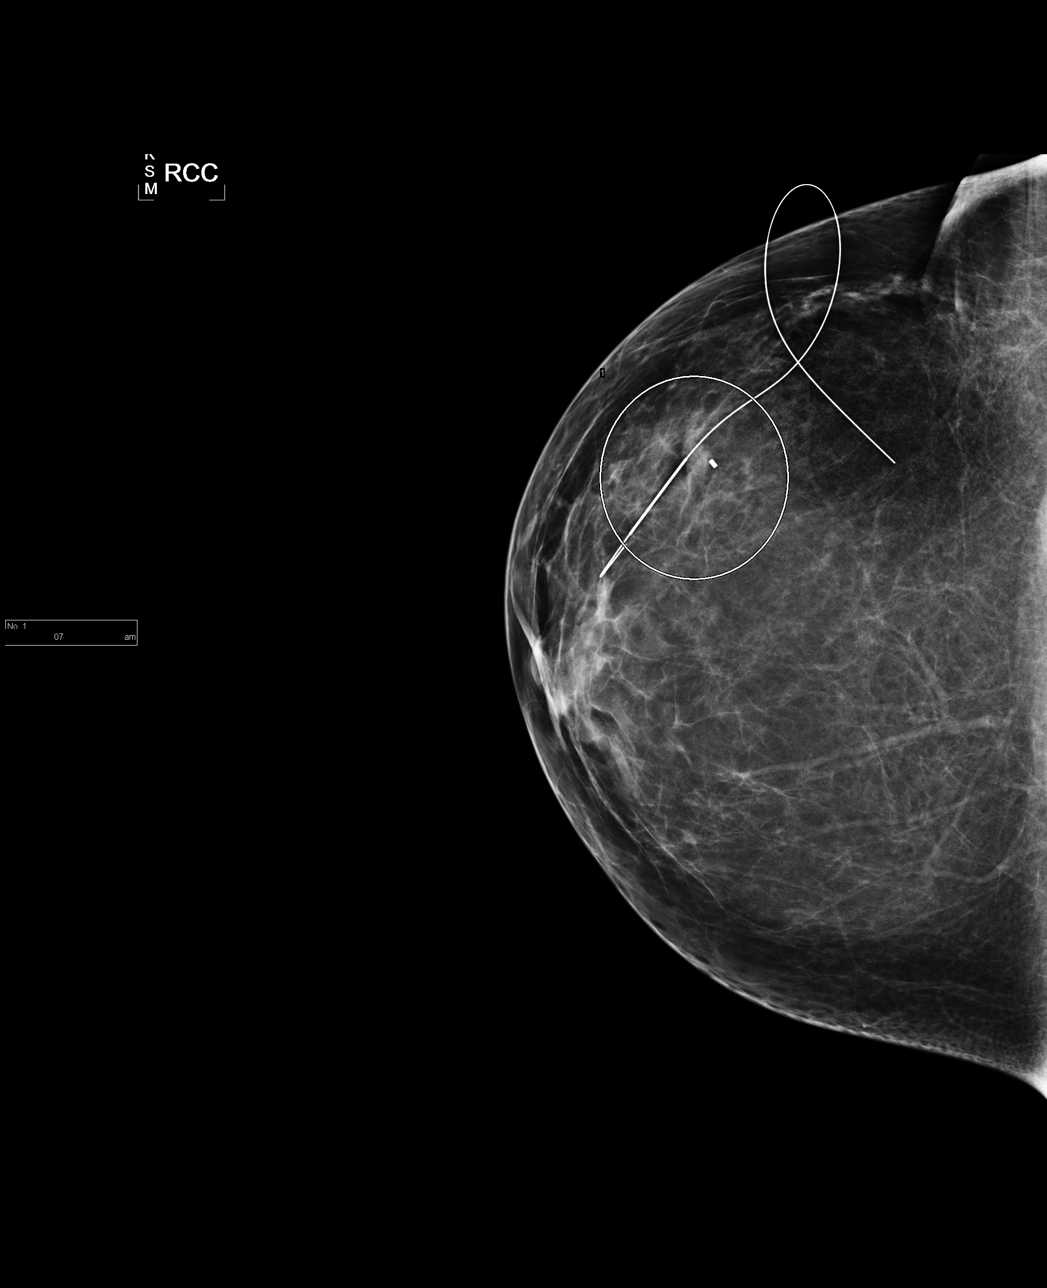

[4 of 4 positions shown; findings below may reference images not displayed]

Patient presents for needle localization prior to surgical
excision.  I met with the patient and we discussed the procedure of
needle localization including benefits and alternatives. We
discussed the high likelihood of a successful procedure. We
discussed the risks of the procedure, including infection,
bleeding, tissue injury, and further surgery. Informed, written
consent was given. The usual time-out protocol was performed
immediately prior to the procedure.

Using ultrasound guidance, sterile technique, 2% lidocaine and a 7
cm modified Kopans needle, the irregular hypoechoic mass within the
right breast 9 o'clock position 3 cm from the nipple was localized
using a lateral approach.  The films are marked for Dr. CALEB.

Specimen radiograph is performed at [facility], and confirms the
clip and Kopans wire present in the tissue sample.  The specimen is
marked for pathology.
IMPRESSION: Needle localization right breast.  No apparent complications.

## 2013-02-26 SURGERY — BREAST LUMPECTOMY WITH NEEDLE LOCALIZATION
Anesthesia: General | Site: Breast | Laterality: Right | Wound class: Clean

## 2013-02-26 MED ORDER — OXYCODONE HCL 5 MG PO TABS: 5.0000 mg | ORAL_TABLET | Freq: Once | ORAL | Status: DC | PRN

## 2013-02-26 MED ORDER — ONDANSETRON HCL 4 MG/2ML IJ SOLN
INTRAMUSCULAR | Status: DC | PRN
  Administered 2013-02-26: 4 mg via INTRAVENOUS

## 2013-02-26 MED ORDER — LIDOCAINE HCL (CARDIAC) 20 MG/ML IV SOLN
INTRAVENOUS | Status: DC | PRN
  Administered 2013-02-26: 100 mg via INTRAVENOUS

## 2013-02-26 MED ORDER — PROPOFOL 10 MG/ML IV BOLUS
INTRAVENOUS | Status: DC | PRN
  Administered 2013-02-26: 200 mg via INTRAVENOUS

## 2013-02-26 MED ORDER — MIDAZOLAM HCL 5 MG/5ML IJ SOLN
INTRAMUSCULAR | Status: DC | PRN
  Administered 2013-02-26: 2 mg via INTRAVENOUS

## 2013-02-26 MED ORDER — HYDROMORPHONE HCL PF 1 MG/ML IJ SOLN: 0.2500 mg | INTRAMUSCULAR | Status: DC | PRN

## 2013-02-26 MED ORDER — OXYCODONE HCL 5 MG/5ML PO SOLN: 5.0000 mg | Freq: Once | ORAL | Status: DC | PRN

## 2013-02-26 MED ORDER — BUPIVACAINE-EPINEPHRINE 0.25% -1:200000 IJ SOLN
INTRAMUSCULAR | Status: DC | PRN
  Administered 2013-02-26: 20 mL

## 2013-02-26 MED ORDER — OXYCODONE HCL 5 MG PO TABS
5.0000 mg | ORAL_TABLET | Freq: Once | ORAL | Status: AC | PRN
  Administered 2013-02-26: 5 mg via ORAL

## 2013-02-26 MED ORDER — PROMETHAZINE HCL 25 MG/ML IJ SOLN: 6.2500 mg | INTRAMUSCULAR | Status: DC | PRN

## 2013-02-26 MED ORDER — DEXAMETHASONE SODIUM PHOSPHATE 4 MG/ML IJ SOLN
INTRAMUSCULAR | Status: DC | PRN
  Administered 2013-02-26: 10 mg via INTRAVENOUS

## 2013-02-26 MED ORDER — OXYCODONE-ACETAMINOPHEN 5-325 MG PO TABS: 1.0000 | ORAL_TABLET | ORAL | Status: DC | PRN

## 2013-02-26 MED ORDER — FENTANYL CITRATE 0.05 MG/ML IJ SOLN
INTRAMUSCULAR | Status: DC | PRN
  Administered 2013-02-26: 100 ug via INTRAVENOUS

## 2013-02-26 MED ORDER — LACTATED RINGERS IV SOLN
INTRAVENOUS | Status: DC
  Administered 2013-02-26: 10:00:00 via INTRAVENOUS

## 2013-02-26 MED ORDER — CIPROFLOXACIN IN D5W 400 MG/200ML IV SOLN
400.0000 mg | INTRAVENOUS | Status: AC
  Administered 2013-02-26 (×2): 400 mg via INTRAVENOUS

## 2013-02-26 SURGICAL SUPPLY — 46 items
ADH SKN CLS APL DERMABOND .7 (GAUZE/BANDAGES/DRESSINGS) ×1
BINDER BREAST LRG (GAUZE/BANDAGES/DRESSINGS) IMPLANT
BINDER BREAST MEDIUM (GAUZE/BANDAGES/DRESSINGS) IMPLANT
BINDER BREAST XLRG (GAUZE/BANDAGES/DRESSINGS) IMPLANT
BINDER BREAST XXLRG (GAUZE/BANDAGES/DRESSINGS) IMPLANT
BLADE SURG 15 STRL LF DISP TIS (BLADE) ×1 IMPLANT
BLADE SURG 15 STRL SS (BLADE) ×2
CANISTER SUCTION 1200CC (MISCELLANEOUS) ×2 IMPLANT
CHLORAPREP W/TINT 26ML (MISCELLANEOUS) ×2 IMPLANT
CLIP TI WIDE RED SMALL 6 (CLIP) IMPLANT
CLOTH BEACON ORANGE TIMEOUT ST (SAFETY) ×2 IMPLANT
COVER MAYO STAND STRL (DRAPES) ×2 IMPLANT
COVER TABLE BACK 60X90 (DRAPES) ×2 IMPLANT
DECANTER SPIKE VIAL GLASS SM (MISCELLANEOUS) ×1 IMPLANT
DERMABOND ADVANCED (GAUZE/BANDAGES/DRESSINGS) ×1
DERMABOND ADVANCED .7 DNX12 (GAUZE/BANDAGES/DRESSINGS) ×1 IMPLANT
DEVICE DUBIN W/COMP PLATE 8390 (MISCELLANEOUS) ×1 IMPLANT
DRAPE LAPAROSCOPIC ABDOMINAL (DRAPES) IMPLANT
DRAPE PED LAPAROTOMY (DRAPES) ×2 IMPLANT
DRAPE UTILITY XL STRL (DRAPES) ×2 IMPLANT
ELECT COATED BLADE 2.86 ST (ELECTRODE) ×2 IMPLANT
ELECT REM PT RETURN 9FT ADLT (ELECTROSURGICAL) ×2
ELECTRODE REM PT RTRN 9FT ADLT (ELECTROSURGICAL) ×1 IMPLANT
GLOVE BIO SURGEON STRL SZ8 (GLOVE) ×1 IMPLANT
GLOVE BIOGEL PI IND STRL 8 (GLOVE) ×1 IMPLANT
GLOVE BIOGEL PI INDICATOR 8 (GLOVE) ×2
GLOVE ECLIPSE 8.0 STRL XLNG CF (GLOVE) ×2 IMPLANT
GOWN PREVENTION PLUS XLARGE (GOWN DISPOSABLE) ×2 IMPLANT
GOWN PREVENTION PLUS XXLARGE (GOWN DISPOSABLE) ×1 IMPLANT
KIT MARKER MARGIN INK (KITS) IMPLANT
NDL HYPO 25X1 1.5 SAFETY (NEEDLE) ×1 IMPLANT
NEEDLE HYPO 25X1 1.5 SAFETY (NEEDLE) ×2 IMPLANT
NS IRRIG 1000ML POUR BTL (IV SOLUTION) ×2 IMPLANT
PACK BASIN DAY SURGERY FS (CUSTOM PROCEDURE TRAY) ×2 IMPLANT
PENCIL BUTTON HOLSTER BLD 10FT (ELECTRODE) ×2 IMPLANT
SLEEVE SCD COMPRESS KNEE MED (MISCELLANEOUS) ×2 IMPLANT
SPONGE LAP 4X18 X RAY DECT (DISPOSABLE) ×2 IMPLANT
SUT MON AB 4-0 PC3 18 (SUTURE) ×2 IMPLANT
SUT SILK 2 0 SH (SUTURE) IMPLANT
SUT VIC AB 3-0 SH 27 (SUTURE) ×4
SUT VIC AB 3-0 SH 27X BRD (SUTURE) ×1 IMPLANT
SYR CONTROL 10ML LL (SYRINGE) ×2 IMPLANT
TOWEL OR 17X24 6PK STRL BLUE (TOWEL DISPOSABLE) ×3 IMPLANT
TOWEL OR NON WOVEN STRL DISP B (DISPOSABLE) ×2 IMPLANT
TUBE CONNECTING 20X1/4 (TUBING) ×2 IMPLANT
YANKAUER SUCT BULB TIP NO VENT (SUCTIONS) ×2 IMPLANT

## 2013-02-26 NOTE — Op Note (Signed)
Angela Miles  12/12/1970  [id number]  02/26/2013   Preoperative diagnosis: right breast mass  Postoperative diagnosis: same  Procedure: right breast needle localized lumpectomy  Surgeon: Currie Paris, MD, FACS  Anesthesia: General with 0.25% sensorcaine with epinephrine   Clinical History and Indications: this patient presents for a guidewire localized excision of a right breast mass.  Description of procedure: The patient was seen in the holding area and the plans for the procedure reviewed. The right  breast was marked as the operative side. The wire localizing films were reviewed.  The patient was taken to the operating room and after satisfactory general anesthesia had been obtained the right  breast was prepped and draped and the timeout was performed.  The incision was made over the presumed area of the mass. Skin flaps were raised and using cautery the area was completely excised. Bleeders were controlled with either cautery or sutures as needed. The mass was removed in its entirety and gross margins were negative. Films show clip wire and mass in specimens.   After achieving hemostasis, the incision was closed with 3-0 Vicryl, 4-0 Monocryl subcuticular, and Dermabond.  The patient tolerated the procedure well. There were no operative complications. All counts were correct.   EBL: 20 cc  Dortha Schwalbe , MD, FACS 02/26/2013 10:42 AM

## 2013-02-26 NOTE — Interval H&P Note (Signed)
History and Physical Interval Note:  02/26/2013 9:22 AM  Angela Miles  has presented today for surgery, with the diagnosis of RIGHT BREAST MASS  The various methods of treatment have been discussed with the patient and family. After consideration of risks, benefits and other options for treatment, the patient has consented to  Procedure(s) with comments: BREAST LUMPECTOMY WITH NEEDLE LOCALIZATION (Right) - NEEDLE LOCALIZATION AT [facility] [place] 7;30  as a surgical intervention .  The patient's history has been reviewed, patient examined, no change in status, stable for surgery.  I have reviewed the patient's chart and labs.  Questions were answered to the patient's satisfaction.     [name] A.

## 2013-02-26 NOTE — Anesthesia Preprocedure Evaluation (Signed)
Anesthesia Evaluation  Patient identified by MRN, date of birth, ID band Patient awake    Reviewed: Allergy & Precautions, H&P , NPO status , Patient's Chart, lab work & pertinent test results  History of Anesthesia Complications Negative for: history of anesthetic complications  Airway Mallampati: II TM Distance: >3 FB Neck ROM: Full    Dental  (+) Teeth Intact and Dental Advisory Given   Pulmonary asthma ,    Pulmonary exam normal       Cardiovascular negative cardio ROS      Neuro/Psych negative neurological ROS  negative psych ROS   GI/Hepatic negative GI ROS, Neg liver ROS,   Endo/Other  negative endocrine ROS  Renal/GU negative Renal ROS  negative genitourinary   Musculoskeletal negative musculoskeletal ROS (+)   Abdominal   Peds negative pediatric ROS (+)  Hematology negative hematology ROS (+)   Anesthesia Other Findings   Reproductive/Obstetrics negative OB ROS                           Anesthesia Physical Anesthesia Plan  ASA: II  Anesthesia Plan: General   Post-op Pain Management:    Induction: Intravenous  Airway Management Planned: LMA  Additional Equipment:   Intra-op Plan:   Post-operative Plan: Extubation in OR  Informed Consent: I have reviewed the patients History and Physical, chart, labs and discussed the procedure including the risks, benefits and alternatives for the proposed anesthesia with the patient or authorized representative who has indicated his/her understanding and acceptance.   Dental advisory given  Plan Discussed with: CRNA, Anesthesiologist and Surgeon  Anesthesia Plan Comments:         Anesthesia Quick Evaluation

## 2013-02-26 NOTE — Transfer of Care (Signed)
Immediate Anesthesia Transfer of Care Note  Patient: Angela Miles  Procedure(s) Performed: Procedure(s) with comments: BREAST LUMPECTOMY WITH NEEDLE LOCALIZATION (Right) - NEEDLE LOCALIZATION AT [facility] [place] 7;30   Patient Location: PACU  Anesthesia Type:General  Level of Consciousness: sedated  Airway & Oxygen Therapy: Patient Spontanous Breathing and Patient connected to face mask oxygen  Post-op Assessment: Report given to PACU RN and Post -op Vital signs reviewed and stable  Post vital signs: Reviewed and stable  Complications: No apparent anesthesia complications

## 2013-02-26 NOTE — H&P (View-Only) (Signed)
Patient ID: Angela Miles, female   DOB: 01/22/1971, 42 y.o.   MRN: [id number]  Chief Complaint  Patient presents with  . New Evaluation    eval br mass    HPI Angela Miles is a 42 y.o. female.  Patient sent at the request of Dr. Ross her abnormal mammogram. She underwent a mammogram which showed a mammographic abnormality in the right upper-outer quadrant. Core biopsy showed fibroma adenomatoid change but this was felt to be discordant and excision was recommended. Patient denies any previous history of breast mass, breast pain or nipple discharge. She underwent cryosurgery for abnormal Pap smear recently. No history of breast cancer. HPI  Past Medical History  Diagnosis Date  . Anemia   . Cancer     Past Surgical History  Procedure Laterality Date  . Cesarean section    . Cholecystectomy    . Gastric bypass    . Cervical biopsy      Family History  Problem Relation Age of Onset  . Cancer Maternal Uncle     colon    Social History History  Substance Use Topics  . Smoking status: Former Smoker    Quit date: 02/11/2009  . Smokeless tobacco: Never Used  . Alcohol Use: No    Allergies  Allergen Reactions  . Penicillins Nausea Only    Current Outpatient Prescriptions  Medication Sig Dispense Refill  . clarithromycin (BIAXIN) 500 MG tablet       . metroNIDAZOLE (METROGEL) 0.75 % vaginal gel       . PROAIR HFA 108 (90 BASE) MCG/ACT inhaler       . promethazine-codeine (PHENERGAN WITH CODEINE) 6.25-10 MG/5ML syrup       . rOPINIRole (REQUIP) 1 MG tablet        No current facility-administered medications for this visit.    Review of Systems Review of Systems  Constitutional: Negative for fever, chills and unexpected weight change.  HENT: Negative for hearing loss, congestion, sore throat, trouble swallowing and voice change.   Eyes: Negative for visual disturbance.  Respiratory: Negative for cough and wheezing.   Cardiovascular: Negative for chest pain,  palpitations and leg swelling.  Gastrointestinal: Negative for nausea, vomiting, abdominal pain, diarrhea, constipation, blood in stool, abdominal distention and anal bleeding.  Genitourinary: Negative for hematuria, vaginal bleeding and difficulty urinating.  Musculoskeletal: Negative for arthralgias.  Skin: Negative for rash and wound.  Neurological: Negative for seizures, syncope and headaches.  Hematological: Negative for adenopathy. Does not bruise/bleed easily.  Psychiatric/Behavioral: Negative for confusion.    Blood pressure 122/80, pulse 80, temperature 98.1 F (36.7 C), temperature source Temporal, resp. rate 14, height 5' 5.5" (1.664 m), weight 195 lb (88.451 kg), last menstrual period 01/15/2013.  Physical Exam Physical Exam  Constitutional: She is oriented to person, place, and time. She appears well-developed and well-nourished.  HENT:  Head: Normocephalic and atraumatic.  Eyes: EOM are normal. Pupils are equal, round, and reactive to light.  Neck: Normal range of motion. Neck supple.  Cardiovascular: Normal rate and regular rhythm.   Pulmonary/Chest: Effort normal and breath sounds normal. Right breast exhibits no inverted nipple, no mass, no nipple discharge, no skin change and no tenderness. Left breast exhibits no inverted nipple, no mass, no nipple discharge, no skin change and no tenderness. Breasts are symmetrical.  Musculoskeletal: Normal range of motion.  Neurological: She is alert and oriented to person, place, and time.  Skin: Skin is warm and dry.  Psychiatric: She has a normal   mood and affect. Her behavior is normal. Judgment and thought content normal.    Data Reviewed ESTABLISHED PATIENT OFFICE VISIT - LEVEL II ([id number])  Chief Complaint: Right breast bruising following right breast  ultrasound guided core needle biopsy.  History: Status post right breast ultrasound guided core needle  biopsy on 01/18/2013. The patient complains of bruising at the  biopsy  site without pain today.  Exam: The patient has an approximately 7 cm rounded area of  ecchymosis in the inferior right breast at the biopsy site with no  palpable hematoma, tenderness or increased warmth.  Pathology: Benign breast parenchyma with focal fibroadenomatoid  change. No hyperplasia, atypia or malignancy identified.  Assessment and Plan: The pathological findings are not concordant  with the imaging findings. Therefore, mammographic or ultrasound  guided needle localization and surgical excision of the area of  concern in the 9 o'clock position of the right breast is  recommended. The patient will consult with Dr. Ross for surgical  referral.  Original Report Authenticated By: Steven Reid, M.D.   Assessment    Right breast mass status post core biopsy with discordant results    Plan    Recommend excision right breast mass.The procedure has been discussed with the patient. Alternatives to surgery have been discussed with the patient.  Risks of surgery include bleeding,  Infection,  Seroma formation, death,   Cosmetic deformity and the need for further surgery.   Benefits and long term expectations discussed. Risk of malignancy 20% in this circumstance. The patient understands and wishes to proceed.       Cedrica Brune A. 02/11/2013, 9:57 AM    

## 2013-02-26 NOTE — Anesthesia Postprocedure Evaluation (Signed)
Anesthesia Post Note  Patient: Angela Miles  Procedure(s) Performed: Procedure(s) (LRB): BREAST LUMPECTOMY WITH NEEDLE LOCALIZATION (Right)  Anesthesia type: general  Patient location: PACU  Post pain: Pain level controlled  Post assessment: Patient's Cardiovascular Status Stable  Last Vitals:  Filed Vitals:   02/26/13 1145  BP: 145/76  Pulse: 74  Temp: 36.6 C  Resp: 16    Post vital signs: Reviewed and stable  Level of consciousness: sedated  Complications: No apparent anesthesia complications

## 2013-02-26 NOTE — Anesthesia Procedure Notes (Signed)
Procedure Name: LMA Insertion Date/Time: 02/26/2013 10:12 AM Performed by: Burna Cash Pre-anesthesia Checklist: Patient identified, Emergency Drugs available, Suction available and Patient being monitored Patient Re-evaluated:Patient Re-evaluated prior to inductionOxygen Delivery Method: Circle System Utilized Preoxygenation: Pre-oxygenation with 100% oxygen Intubation Type: IV induction Ventilation: Mask ventilation without difficulty LMA: LMA inserted LMA Size: 4.0 Number of attempts: 1 Airway Equipment and Method: bite block Placement Confirmation: positive ETCO2 Tube secured with: Tape Dental Injury: Teeth and Oropharynx as per pre-operative assessment

## 2013-02-27 ENCOUNTER — Encounter (HOSPITAL_BASED_OUTPATIENT_CLINIC_OR_DEPARTMENT_OTHER): Payer: Self-pay | Admitting: Surgery

## 2013-02-28 ENCOUNTER — Telehealth (INDEPENDENT_AMBULATORY_CARE_PROVIDER_SITE_OTHER): Payer: Self-pay

## 2013-02-28 NOTE — Telephone Encounter (Signed)
Message copied by Brennan Bailey on Thu Feb 28, 2013  2:12 PM ------      Message from: Harriette Bouillon A      Created: Thu Feb 28, 2013 12:11 PM       NO CANCER ------

## 2013-02-28 NOTE — Telephone Encounter (Signed)
LMOM Path came back as PASH. No cancer.

## 2013-02-28 NOTE — Telephone Encounter (Signed)
Patient called back and was given results below.

## 2013-03-01 ENCOUNTER — Telehealth (INDEPENDENT_AMBULATORY_CARE_PROVIDER_SITE_OTHER): Payer: Self-pay

## 2013-03-01 NOTE — Telephone Encounter (Signed)
Called patient and let her know Dr Luisa Hart will extend disability. She can go back to work on 03/18/13. Angela Miles to fax correct dates in to disability company.

## 2013-03-15 ENCOUNTER — Encounter (INDEPENDENT_AMBULATORY_CARE_PROVIDER_SITE_OTHER): Payer: Self-pay | Admitting: Surgery

## 2013-03-15 ENCOUNTER — Ambulatory Visit (INDEPENDENT_AMBULATORY_CARE_PROVIDER_SITE_OTHER): Payer: BC Managed Care – PPO | Admitting: Surgery

## 2013-03-15 VITALS — BP 120/80 | HR 76 | Resp 14 | Ht 65.0 in | Wt 191.0 lb

## 2013-03-15 DIAGNOSIS — Z9889 Other specified postprocedural states: Secondary | ICD-10-CM

## 2013-03-15 NOTE — Patient Instructions (Signed)
Yearly mammogram and exam by primary care.  Resume activity as tolerated.  Return as needed

## 2013-03-15 NOTE — Progress Notes (Signed)
Patient returns after right breast lumpectomy and final pathology showed Breast, lumpectomy, Right - PSEUDOANGIOMATOUS STROMAL HYPERPLASIA (PASH)    She's having some diarrhea and her primary care doctor is working this up. She is scheduled to see a specialist for this. No significant abdominal pain.  Exam: Right breast incision clean dry and intact. No signs of infection. Swelling is minimal.  Impression: Status post right breast lumpectomy  Plan: No further treatment or followup necessary. Workup of diarrhea for primary care and specialist. Resume full activity no restriction once able

## 2016-01-18 DIAGNOSIS — S90122A Contusion of left lesser toe(s) without damage to nail, initial encounter: Secondary | ICD-10-CM | POA: Insufficient documentation

## 2016-01-18 DIAGNOSIS — M79672 Pain in left foot: Secondary | ICD-10-CM | POA: Insufficient documentation

## 2016-10-03 DIAGNOSIS — M7062 Trochanteric bursitis, left hip: Secondary | ICD-10-CM | POA: Diagnosis not present

## 2016-10-03 DIAGNOSIS — D510 Vitamin B12 deficiency anemia due to intrinsic factor deficiency: Secondary | ICD-10-CM | POA: Diagnosis not present

## 2016-11-01 DIAGNOSIS — Z1231 Encounter for screening mammogram for malignant neoplasm of breast: Secondary | ICD-10-CM | POA: Diagnosis not present

## 2016-11-01 DIAGNOSIS — Z124 Encounter for screening for malignant neoplasm of cervix: Secondary | ICD-10-CM | POA: Diagnosis not present

## 2016-11-01 DIAGNOSIS — Z01419 Encounter for gynecological examination (general) (routine) without abnormal findings: Secondary | ICD-10-CM | POA: Diagnosis not present

## 2016-12-27 DIAGNOSIS — R197 Diarrhea, unspecified: Secondary | ICD-10-CM | POA: Diagnosis not present

## 2016-12-27 DIAGNOSIS — K6289 Other specified diseases of anus and rectum: Secondary | ICD-10-CM | POA: Diagnosis not present

## 2016-12-27 DIAGNOSIS — K625 Hemorrhage of anus and rectum: Secondary | ICD-10-CM | POA: Diagnosis not present

## 2017-05-22 DIAGNOSIS — R0602 Shortness of breath: Secondary | ICD-10-CM | POA: Diagnosis not present

## 2017-05-22 DIAGNOSIS — R079 Chest pain, unspecified: Secondary | ICD-10-CM | POA: Diagnosis not present

## 2017-05-22 DIAGNOSIS — D510 Vitamin B12 deficiency anemia due to intrinsic factor deficiency: Secondary | ICD-10-CM | POA: Diagnosis not present

## 2017-05-22 DIAGNOSIS — Z0000 Encounter for general adult medical examination without abnormal findings: Secondary | ICD-10-CM | POA: Diagnosis not present

## 2017-05-22 DIAGNOSIS — R0789 Other chest pain: Secondary | ICD-10-CM | POA: Diagnosis not present

## 2017-05-22 DIAGNOSIS — I10 Essential (primary) hypertension: Secondary | ICD-10-CM | POA: Insufficient documentation

## 2017-05-24 DIAGNOSIS — R079 Chest pain, unspecified: Secondary | ICD-10-CM | POA: Diagnosis not present

## 2017-06-19 DIAGNOSIS — R079 Chest pain, unspecified: Secondary | ICD-10-CM | POA: Diagnosis not present

## 2017-07-17 DIAGNOSIS — M542 Cervicalgia: Secondary | ICD-10-CM | POA: Diagnosis not present

## 2017-07-17 DIAGNOSIS — S161XXA Strain of muscle, fascia and tendon at neck level, initial encounter: Secondary | ICD-10-CM | POA: Diagnosis not present

## 2017-07-17 DIAGNOSIS — M9901 Segmental and somatic dysfunction of cervical region: Secondary | ICD-10-CM | POA: Diagnosis not present

## 2017-07-18 DIAGNOSIS — M542 Cervicalgia: Secondary | ICD-10-CM | POA: Diagnosis not present

## 2017-07-18 DIAGNOSIS — M9901 Segmental and somatic dysfunction of cervical region: Secondary | ICD-10-CM | POA: Diagnosis not present

## 2017-07-18 DIAGNOSIS — S161XXA Strain of muscle, fascia and tendon at neck level, initial encounter: Secondary | ICD-10-CM | POA: Diagnosis not present

## 2017-07-19 DIAGNOSIS — M9901 Segmental and somatic dysfunction of cervical region: Secondary | ICD-10-CM | POA: Diagnosis not present

## 2017-07-19 DIAGNOSIS — S161XXA Strain of muscle, fascia and tendon at neck level, initial encounter: Secondary | ICD-10-CM | POA: Diagnosis not present

## 2017-07-19 DIAGNOSIS — M542 Cervicalgia: Secondary | ICD-10-CM | POA: Diagnosis not present

## 2017-07-25 DIAGNOSIS — M9901 Segmental and somatic dysfunction of cervical region: Secondary | ICD-10-CM | POA: Diagnosis not present

## 2017-07-25 DIAGNOSIS — M542 Cervicalgia: Secondary | ICD-10-CM | POA: Diagnosis not present

## 2017-07-25 DIAGNOSIS — S161XXA Strain of muscle, fascia and tendon at neck level, initial encounter: Secondary | ICD-10-CM | POA: Diagnosis not present

## 2017-08-28 DIAGNOSIS — D510 Vitamin B12 deficiency anemia due to intrinsic factor deficiency: Secondary | ICD-10-CM | POA: Diagnosis not present

## 2017-09-05 DIAGNOSIS — R102 Pelvic and perineal pain: Secondary | ICD-10-CM | POA: Diagnosis not present

## 2017-09-15 DIAGNOSIS — J029 Acute pharyngitis, unspecified: Secondary | ICD-10-CM | POA: Diagnosis not present

## 2017-09-15 DIAGNOSIS — J019 Acute sinusitis, unspecified: Secondary | ICD-10-CM | POA: Diagnosis not present

## 2017-09-21 DIAGNOSIS — J208 Acute bronchitis due to other specified organisms: Secondary | ICD-10-CM | POA: Diagnosis not present

## 2017-10-06 DIAGNOSIS — J019 Acute sinusitis, unspecified: Secondary | ICD-10-CM | POA: Diagnosis not present

## 2017-10-06 DIAGNOSIS — J208 Acute bronchitis due to other specified organisms: Secondary | ICD-10-CM | POA: Diagnosis not present

## 2018-02-19 DIAGNOSIS — D510 Vitamin B12 deficiency anemia due to intrinsic factor deficiency: Secondary | ICD-10-CM | POA: Diagnosis not present

## 2018-03-23 DIAGNOSIS — D510 Vitamin B12 deficiency anemia due to intrinsic factor deficiency: Secondary | ICD-10-CM | POA: Diagnosis not present

## 2018-04-23 DIAGNOSIS — Z0000 Encounter for general adult medical examination without abnormal findings: Secondary | ICD-10-CM | POA: Diagnosis not present

## 2018-04-23 DIAGNOSIS — D509 Iron deficiency anemia, unspecified: Secondary | ICD-10-CM | POA: Diagnosis not present

## 2018-04-23 DIAGNOSIS — D510 Vitamin B12 deficiency anemia due to intrinsic factor deficiency: Secondary | ICD-10-CM | POA: Diagnosis not present

## 2018-05-14 DIAGNOSIS — J039 Acute tonsillitis, unspecified: Secondary | ICD-10-CM | POA: Diagnosis not present

## 2018-05-14 DIAGNOSIS — Z6831 Body mass index (BMI) 31.0-31.9, adult: Secondary | ICD-10-CM | POA: Diagnosis not present

## 2018-05-28 DIAGNOSIS — D510 Vitamin B12 deficiency anemia due to intrinsic factor deficiency: Secondary | ICD-10-CM | POA: Diagnosis not present

## 2018-07-02 DIAGNOSIS — D510 Vitamin B12 deficiency anemia due to intrinsic factor deficiency: Secondary | ICD-10-CM | POA: Diagnosis not present

## 2018-09-10 DIAGNOSIS — D510 Vitamin B12 deficiency anemia due to intrinsic factor deficiency: Secondary | ICD-10-CM | POA: Diagnosis not present

## 2018-09-29 DIAGNOSIS — J019 Acute sinusitis, unspecified: Secondary | ICD-10-CM | POA: Diagnosis not present

## 2018-09-29 DIAGNOSIS — Z6832 Body mass index (BMI) 32.0-32.9, adult: Secondary | ICD-10-CM | POA: Diagnosis not present

## 2018-10-15 DIAGNOSIS — D509 Iron deficiency anemia, unspecified: Secondary | ICD-10-CM | POA: Diagnosis not present

## 2018-11-16 DIAGNOSIS — D509 Iron deficiency anemia, unspecified: Secondary | ICD-10-CM | POA: Diagnosis not present

## 2018-12-25 DIAGNOSIS — D2239 Melanocytic nevi of other parts of face: Secondary | ICD-10-CM | POA: Diagnosis not present

## 2018-12-25 DIAGNOSIS — L820 Inflamed seborrheic keratosis: Secondary | ICD-10-CM | POA: Diagnosis not present

## 2018-12-26 DIAGNOSIS — D510 Vitamin B12 deficiency anemia due to intrinsic factor deficiency: Secondary | ICD-10-CM | POA: Diagnosis not present

## 2019-07-31 ENCOUNTER — Other Ambulatory Visit: Payer: Self-pay | Admitting: Sports Medicine

## 2019-07-31 ENCOUNTER — Ambulatory Visit (INDEPENDENT_AMBULATORY_CARE_PROVIDER_SITE_OTHER): Payer: 59

## 2019-07-31 ENCOUNTER — Encounter: Payer: Self-pay | Admitting: Sports Medicine

## 2019-07-31 ENCOUNTER — Other Ambulatory Visit: Payer: Self-pay

## 2019-07-31 ENCOUNTER — Ambulatory Visit (INDEPENDENT_AMBULATORY_CARE_PROVIDER_SITE_OTHER): Payer: 59 | Admitting: Sports Medicine

## 2019-07-31 DIAGNOSIS — M778 Other enthesopathies, not elsewhere classified: Secondary | ICD-10-CM

## 2019-07-31 DIAGNOSIS — M722 Plantar fascial fibromatosis: Secondary | ICD-10-CM

## 2019-07-31 DIAGNOSIS — M79672 Pain in left foot: Secondary | ICD-10-CM | POA: Diagnosis not present

## 2019-07-31 MED ORDER — MELOXICAM 15 MG PO TABS: 15.0000 mg | ORAL_TABLET | Freq: Every day | ORAL | 0 refills | Status: DC

## 2019-07-31 MED ORDER — TRIAMCINOLONE ACETONIDE 10 MG/ML IJ SUSP
10.0000 mg | Freq: Once | INTRAMUSCULAR | Status: AC
  Administered 2019-07-31: 10 mg

## 2019-07-31 NOTE — Patient Instructions (Addendum)
For tennis shoes recommend:  Kandy Garrison Ascis New balance Saucony Can be purchased at [employer] or [employer]  Vionic  SAS Can be purchased at [employer] or [employer]   For work shoes recommend: [employer] Work [employer]  Can be purchased at a variety of places or Engineer, maintenance (IT)   For casual shoes recommend: Vionic  Can be purchased at [employer] or [employer]    Plantar Fasciitis (Heel Spur Syndrome) with Rehab The plantar fascia is a fibrous, ligament-like, soft-tissue structure that spans the bottom of the foot. Plantar fasciitis is a condition that causes pain in the foot due to inflammation of the tissue. SYMPTOMS   Pain and tenderness on the underneath side of the foot.  Pain that worsens with standing or walking. CAUSES  Plantar fasciitis is caused by irritation and injury to the plantar fascia on the underneath side of the foot. Common mechanisms of injury include:  Direct trauma to bottom of the foot.  Damage to a small nerve that runs under the foot where the main fascia attaches to the heel bone.  Stress placed on the plantar fascia due to bone spurs. RISK INCREASES WITH:   Activities that place stress on the plantar fascia (running, jumping, pivoting, or cutting).  Poor strength and flexibility.  Improperly fitted shoes.  Tight calf muscles.  Flat feet.  Failure to warm-up properly before activity.  Obesity. PREVENTION  Warm up and stretch properly before activity.  Allow for adequate recovery between workouts.  Maintain physical fitness:  Strength, flexibility, and endurance.  Cardiovascular fitness.  Maintain a health body weight.  Avoid stress on the plantar fascia.  Wear properly fitted shoes, including arch supports for individuals who have flat feet.  PROGNOSIS  If treated properly, then the symptoms of plantar fasciitis usually resolve without surgery. However, occasionally surgery is necessary.  RELATED COMPLICATIONS   Recurrent  symptoms that may result in a chronic condition.  Problems of the lower back that are caused by compensating for the injury, such as limping.  Pain or weakness of the foot during push-off following surgery.  Chronic inflammation, scarring, and partial or complete fascia tear, occurring more often from repeated injections.  TREATMENT  Treatment initially involves the use of ice and medication to help reduce pain and inflammation. The use of strengthening and stretching exercises may help reduce pain with activity, especially stretches of the Achilles tendon. These exercises may be performed at home or with a therapist. Your caregiver may recommend that you use heel cups of arch supports to help reduce stress on the plantar fascia. Occasionally, corticosteroid injections are given to reduce inflammation. If symptoms persist for greater than 6 months despite non-surgical (conservative), then surgery may be recommended.   MEDICATION   If pain medication is necessary, then nonsteroidal anti-inflammatory medications, such as aspirin and ibuprofen, or other minor pain relievers, such as acetaminophen, are often recommended.  Do not take pain medication within 7 days before surgery.  Prescription pain relievers may be given if deemed necessary by your caregiver. Use only as directed and only as much as you need.  Corticosteroid injections may be given by your caregiver. These injections should be reserved for the most serious cases, because they may only be given a certain number of times.  HEAT AND COLD  Cold treatment (icing) relieves pain and reduces inflammation. Cold treatment should be applied for 10 to 15 minutes every 2 to 3 hours for inflammation and pain and immediately after any activity that aggravates  your symptoms. Use ice packs or massage the area with a piece of ice (ice massage).  Heat treatment may be used prior to performing the stretching and strengthening activities prescribed  by your caregiver, physical therapist, or athletic trainer. Use a heat pack or soak the injury in warm water.  SEEK IMMEDIATE MEDICAL CARE IF:  Treatment seems to offer no benefit, or the condition worsens.  Any medications produce adverse side effects.  EXERCISES- RANGE OF MOTION (ROM) AND STRETCHING EXERCISES - Plantar Fasciitis (Heel Spur Syndrome) These exercises may help you when beginning to rehabilitate your injury. Your symptoms may resolve with or without further involvement from your physician, physical therapist or athletic trainer. While completing these exercises, remember:   Restoring tissue flexibility helps normal motion to return to the joints. This allows healthier, less painful movement and activity.  An effective stretch should be held for at least 30 seconds.  A stretch should never be painful. You should only feel a gentle lengthening or release in the stretched tissue.  RANGE OF MOTION - Toe Extension, Flexion  Sit with your right / left leg crossed over your opposite knee.  Grasp your toes and gently pull them back toward the top of your foot. You should feel a stretch on the bottom of your toes and/or foot.  Hold this stretch for 10 seconds.  Now, gently pull your toes toward the bottom of your foot. You should feel a stretch on the top of your toes and or foot.  Hold this stretch for 10 seconds. Repeat  times. Complete this stretch 3 times per day.   RANGE OF MOTION - Ankle Dorsiflexion, Active Assisted  Remove shoes and sit on a chair that is preferably not on a carpeted surface.  Place right / left foot under knee. Extend your opposite leg for support.  Keeping your heel down, slide your right / left foot back toward the chair until you feel a stretch at your ankle or calf. If you do not feel a stretch, slide your bottom forward to the edge of the chair, while still keeping your heel down.  Hold this stretch for 10 seconds. Repeat 3 times. Complete  this stretch 2 times per day.   STRETCH  Gastroc, Standing  Place hands on wall.  Extend right / left leg, keeping the front knee somewhat bent.  Slightly point your toes inward on your back foot.  Keeping your right / left heel on the floor and your knee straight, shift your weight toward the wall, not allowing your back to arch.  You should feel a gentle stretch in the right / left calf. Hold this position for 10 seconds. Repeat 3 times. Complete this stretch 2 times per day.  STRETCH  Soleus, Standing  Place hands on wall.  Extend right / left leg, keeping the other knee somewhat bent.  Slightly point your toes inward on your back foot.  Keep your right / left heel on the floor, bend your back knee, and slightly shift your weight over the back leg so that you feel a gentle stretch deep in your back calf.  Hold this position for 10 seconds. Repeat 3 times. Complete this stretch 2 times per day.  STRETCH  Gastrocsoleus, Standing  Note: This exercise can place a lot of stress on your foot and ankle. Please complete this exercise only if specifically instructed by your caregiver.   Place the ball of your right / left foot on a step, keeping your  other foot firmly on the same step.  Hold on to the wall or a rail for balance.  Slowly lift your other foot, allowing your body weight to press your heel down over the edge of the step.  You should feel a stretch in your right / left calf.  Hold this position for 10 seconds.  Repeat this exercise with a slight bend in your right / left knee. Repeat 3 times. Complete this stretch 2 times per day.   STRENGTHENING EXERCISES - Plantar Fasciitis (Heel Spur Syndrome)  These exercises may help you when beginning to rehabilitate your injury. They may resolve your symptoms with or without further involvement from your physician, physical therapist or athletic trainer. While completing these exercises, remember:   Muscles can gain both  the endurance and the strength needed for everyday activities through controlled exercises.  Complete these exercises as instructed by your physician, physical therapist or athletic trainer. Progress the resistance and repetitions only as guided.  STRENGTH - Towel Curls  Sit in a chair positioned on a non-carpeted surface.  Place your foot on a towel, keeping your heel on the floor.  Pull the towel toward your heel by only curling your toes. Keep your heel on the floor. Repeat 3 times. Complete this exercise 2 times per day.  STRENGTH - Ankle Inversion  Secure one end of a rubber exercise band/tubing to a fixed object (table, pole). Loop the other end around your foot just before your toes.  Place your fists between your knees. This will focus your strengthening at your ankle.  Slowly, pull your big toe up and in, making sure the band/tubing is positioned to resist the entire motion.  Hold this position for 10 seconds.  Have your muscles resist the band/tubing as it slowly pulls your foot back to the starting position. Repeat 3 times. Complete this exercises 2 times per day.  Document Released: 08/01/2005 Document Revised: 10/24/2011 Document Reviewed: 11/13/2008 [facility] Patient Information 2014 [place], [place].

## 2019-07-31 NOTE — Progress Notes (Signed)
Subjective: Angela Miles is a 48 y.o. female patient presents to office with complaint of moderate heel pain on the left left and to the ball of the foot underneath the big toe joint for the last 6 months pain is 5-10 out of 10 sharp in nature with some swelling reports that her big toe joint was hurting so bad that she talk to her primary care doctor and was started on medicine for gout and had a uric acid test which was within normal range at 5.6 so has stopped the gout medication and her doctor referred her to see [place] for follow-up.  Patient reports that the pain is decreased when she wears her good supportive cushioned flip-flops however when she attempts to walk barefoot the pain worsens.  Patient does admit to a history previous of plantar fasciitis. Denies any other pedal complaints.   Review of Systems  All other systems reviewed and are negative.    There are no problems to display for this patient.   No current outpatient medications on file prior to visit.   No current facility-administered medications on file prior to visit.    Allergies  Allergen Reactions  . Penicillins Nausea Only  . Sulfa Antibiotics Palpitations    Objective: Physical Exam General: The patient is alert and oriented x3 in no acute distress.  Dermatology: Skin is warm, dry and supple bilateral lower extremities. Nails 1-10 are normal. There is no erythema, edema, no eccymosis, no open lesions present. Integument is otherwise unremarkable.  Vascular: Dorsalis Pedis pulse and Posterior Tibial pulse are 2/4 bilateral. Capillary fill time is immediate to all digits.  Neurological: Grossly intact to light touch with an achilles reflex of +2/5 and a  negative Tinel's sign bilateral.  Musculoskeletal: Tenderness to palpation at the medial calcaneal tubercale and through the insertion of the plantar fascia on the left and with end range of motion at the plantar aspect at the tibial sesamoid on the left.  No  pain with compression of calcaneus bilateral. No pain with tuning fork to calcaneus bilateral. No pain with calf compression bilateral. There is decreased Ankle joint range of motion bilateral. All other joints range of motion within normal limits bilateral. Strength 5/5 in all groups bilateral.   Gait: Unassisted, Antalgic avoid weight on left  Xray, left: Normal osseous mineralization. Joint spaces preserved. No fracture/dislocation/boney destruction. Calcaneal spur present with mild thickening of plantar fascia. No other soft tissue abnormalities or radiopaque foreign bodies.   Assessment and Plan: Problem List Items Addressed This Visit    None    Visit Diagnoses    Plantar fasciitis, left    -  Primary   Capsulitis of left foot       Left foot pain          -Complete examination performed.  -Xrays reviewed -Discussed with patient in detail the condition of plantar fasciitis and capsulitis of left first metatarsophalangeal joint, how this occurs and general treatment options. Explained both conservative and surgical treatments.  -After oral consent and aseptic prep, injected a mixture containing 1 ml of 2%  plain lidocaine, 1 ml 0.5% plain marcaine, 0.5 ml of kenalog 10 and 0.5 ml of dexamethasone phosphate into left heel post-injection care discussed with patient.  -Rx Meloxicam -Recommended good supportive shoes and advised patient if plantar fascial taping works well may benefit from custom orthotics to help offload heel and arch area as well as first metatarsophalangeal joint -Explained and dispensed to patient daily stretching  exercises. -Recommend patient to ice affected area 1-2x daily. -Patient to return to office in 4 weeks for follow up or sooner if problems or questions arise.  Advised patient if she is doing better may follow-up with brief orthotics however if she is still in pain may follow-up with me for follow-up care.  Landis Martins, DPM

## 2019-08-07 ENCOUNTER — Other Ambulatory Visit: Payer: Self-pay | Admitting: Sports Medicine

## 2019-08-07 DIAGNOSIS — M722 Plantar fascial fibromatosis: Secondary | ICD-10-CM

## 2019-08-28 ENCOUNTER — Ambulatory Visit (INDEPENDENT_AMBULATORY_CARE_PROVIDER_SITE_OTHER): Payer: 59 | Admitting: Orthotics

## 2019-08-28 ENCOUNTER — Other Ambulatory Visit: Payer: Self-pay | Admitting: Sports Medicine

## 2019-08-28 ENCOUNTER — Other Ambulatory Visit: Payer: Self-pay

## 2019-08-28 DIAGNOSIS — M722 Plantar fascial fibromatosis: Secondary | ICD-10-CM

## 2019-08-28 DIAGNOSIS — M778 Other enthesopathies, not elsewhere classified: Secondary | ICD-10-CM

## 2019-08-28 MED ORDER — MELOXICAM 15 MG PO TABS: 15.0000 mg | ORAL_TABLET | Freq: Every day | ORAL | 0 refills | Status: DC

## 2019-08-28 NOTE — Progress Notes (Signed)
Refilled Mobic as given last month. No muscle relaxer on file that I have ever given the patient.  -Dr. Cannon Kettle

## 2019-08-28 NOTE — Progress Notes (Signed)
Patient came into today for casting bilateral f/o to address plantar fasciitis.  Patient reports history of foot pain involving plantar aponeurosis.  Goal is to provide longitudinal arch support and correct any RF instability due to heel eversion/inversion.  Ultimate goal is to relieve tension at pf insertion calcaneal tuberosity.  Plan on semi-rigid device addressing heel stability and relieving PF tension.   Also MET pad to address metatarsalgia as well as heel punch and skive

## 2019-09-25 ENCOUNTER — Other Ambulatory Visit: Payer: 59 | Admitting: Orthotics

## 2019-10-15 ENCOUNTER — Telehealth: Payer: Self-pay | Admitting: Sports Medicine

## 2019-10-15 ENCOUNTER — Other Ambulatory Visit: Payer: 59 | Admitting: Orthotics

## 2019-10-15 ENCOUNTER — Other Ambulatory Visit: Payer: Self-pay

## 2019-10-15 ENCOUNTER — Other Ambulatory Visit: Payer: Self-pay | Admitting: Sports Medicine

## 2019-10-15 MED ORDER — MELOXICAM 15 MG PO TABS: 15.0000 mg | ORAL_TABLET | Freq: Every day | ORAL | 0 refills | Status: DC

## 2019-10-15 NOTE — Telephone Encounter (Signed)
Refill sent.

## 2019-10-15 NOTE — Progress Notes (Signed)
Refilled Mobic °

## 2019-10-15 NOTE — Telephone Encounter (Signed)
Pt came in to see Betha to pick up orthotics and asked me to send you a message to fill her meloxicam please.

## 2019-10-31 ENCOUNTER — Other Ambulatory Visit: Payer: Self-pay | Admitting: Obstetrics & Gynecology

## 2019-10-31 DIAGNOSIS — R928 Other abnormal and inconclusive findings on diagnostic imaging of breast: Secondary | ICD-10-CM

## 2019-11-20 ENCOUNTER — Ambulatory Visit
Admission: RE | Admit: 2019-11-20 | Discharge: 2019-11-20 | Disposition: A | Discharge status: Home / Self Care | Payer: 59 | Source: Ambulatory Visit | Attending: Obstetrics & Gynecology | Admitting: Obstetrics & Gynecology

## 2019-11-20 ENCOUNTER — Other Ambulatory Visit: Payer: Self-pay

## 2019-11-20 ENCOUNTER — Other Ambulatory Visit: Payer: Self-pay | Admitting: Obstetrics & Gynecology

## 2019-11-20 DIAGNOSIS — N63 Unspecified lump in unspecified breast: Secondary | ICD-10-CM

## 2019-11-20 DIAGNOSIS — R928 Other abnormal and inconclusive findings on diagnostic imaging of breast: Secondary | ICD-10-CM

## 2019-11-20 DIAGNOSIS — N6489 Other specified disorders of breast: Secondary | ICD-10-CM

## 2019-11-20 IMAGING — US US BREAST*R* LIMITED INC AXILLA
1 series · 1 of 1 positions shown · non-contrast
Comparison: [DATE], [DATE] and earlier exams

CLINICAL DATA: Patient returns after screening study for evaluation
of possible RIGHT breast asymmetry. Patient had ultrasound guided
biopsy of mass in the 9 o'clock location RIGHT breast [DATE].
Pathology showed fibroadenomatoid change. Excision was performed for
discordant results and was benign.

EXAM:
DIGITAL DIAGNOSTIC RIGHT MAMMOGRAM WITH CAD AND TOMO
ULTRASOUND RIGHT BREAST

[Series 1: us breast*right* limited inc axilla · 0.07mm/px · 1 of 1 slices shown]
[im 1/1]
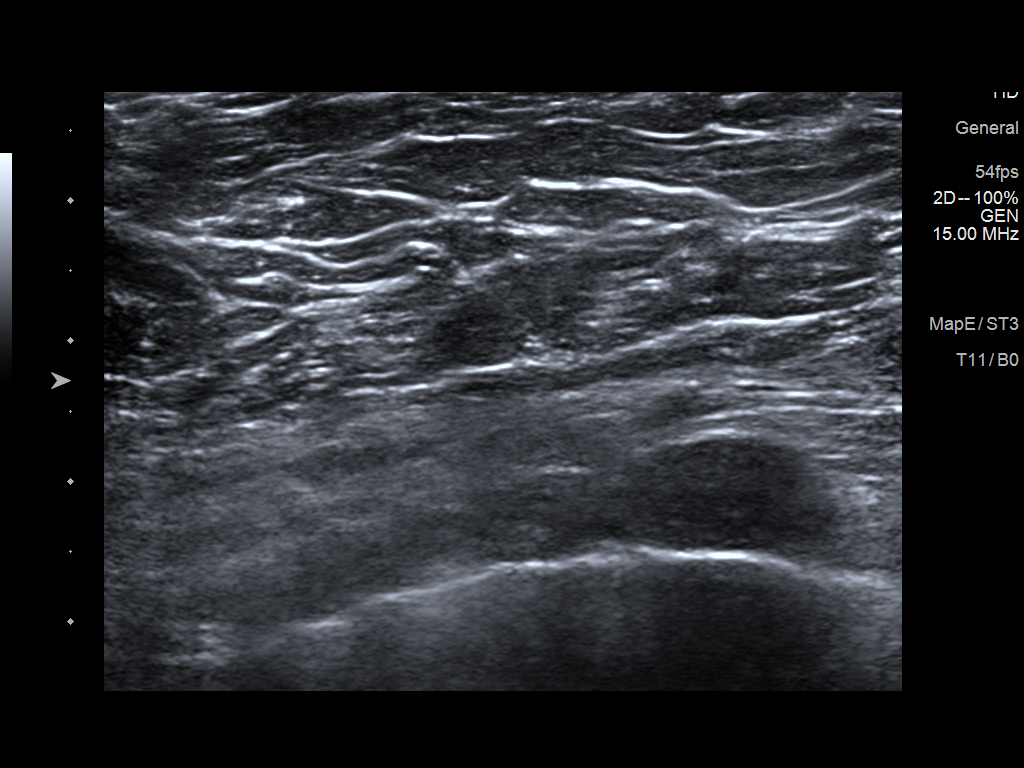

[1 of 1 positions shown; findings below may reference images not displayed]

ACR Breast Density Category b: There are scattered areas of
fibroglandular density.
FINDINGS: Additional 2-D and 3-D images are performed. These views show
persistent focal asymmetry without mass in the LOWER OUTER QUADRANT
of the RIGHT breast.

Mammographic images were processed with CAD.

On physical exam, there is a faint well-healed scar in the 8 o'clock
location of the RIGHT breast, oriented anti radially (scar marker
was placed radially). I palpate no abnormality in the LOWER OUTER
QUADRANT of the RIGHT breast.

Targeted ultrasound is performed, showing normal appearing
fibroglandular tissue in the LOWER OUTER QUADRANT of the RIGHT
breast. No mass or acoustic shadowing identified.
IMPRESSION: Asymmetry in the LOWER OUTER QUADRANT of the RIGHT breast likely
postoperative change from previous excisional biopsy.

RECOMMENDATION:
Recommend RIGHT diagnostic mammogram and possible RIGHT breast
ultrasound in 6 months.

I have discussed the findings and recommendations with the patient.
If applicable, a reminder letter will be sent to the patient
regarding the next appointment.

BI-RADS CATEGORY  3: Probably benign.

## 2019-11-20 IMAGING — MG MM DIGITAL DIAGNOSTIC UNILAT*R* W/ TOMO W/ CAD
4 series · 4 of 12 positions shown · non-contrast
Comparison: [DATE], [DATE] and earlier exams

CLINICAL DATA: Patient returns after screening study for evaluation
of possible RIGHT breast asymmetry. Patient had ultrasound guided
biopsy of mass in the 9 o'clock location RIGHT breast [DATE].
Pathology showed fibroadenomatoid change. Excision was performed for
discordant results and was benign.

EXAM:
DIGITAL DIAGNOSTIC RIGHT MAMMOGRAM WITH CAD AND TOMO
ULTRASOUND RIGHT BREAST

[R CC synth-2D]
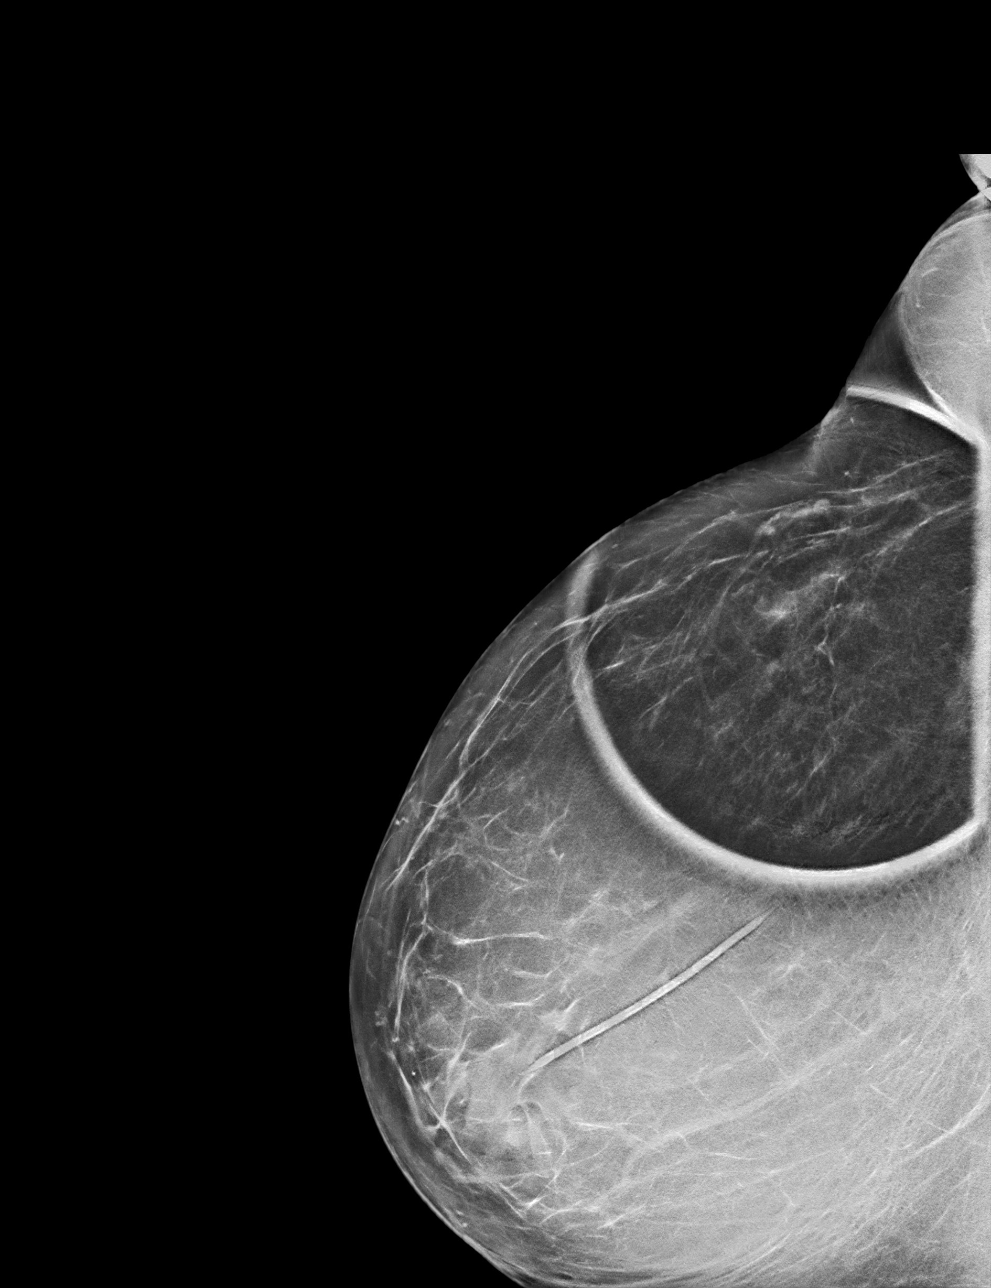

[R MLO synth-2D]
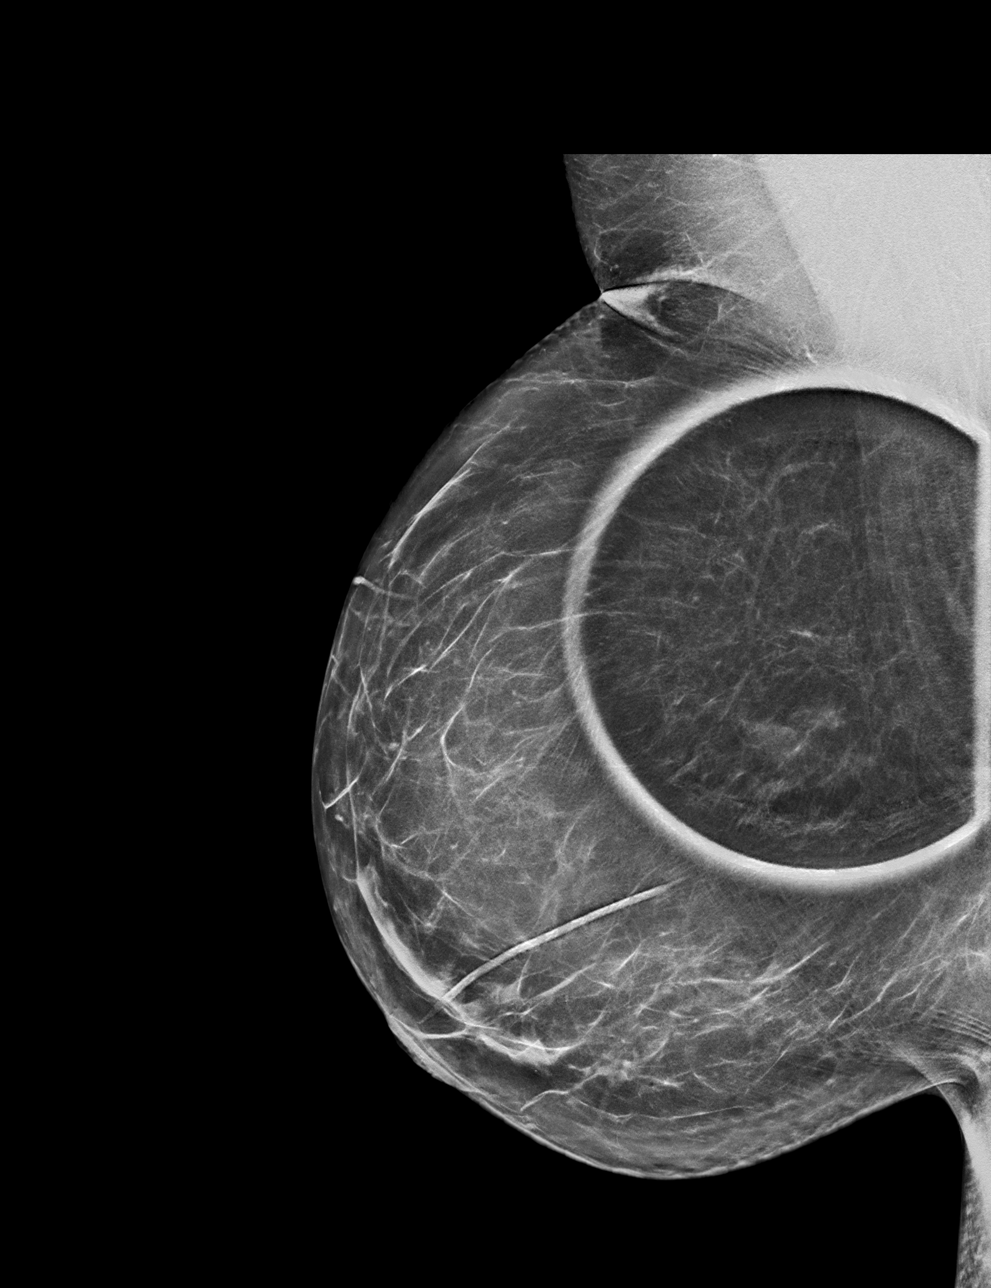

[R MLO tomo · tomo slice 33/66.0]
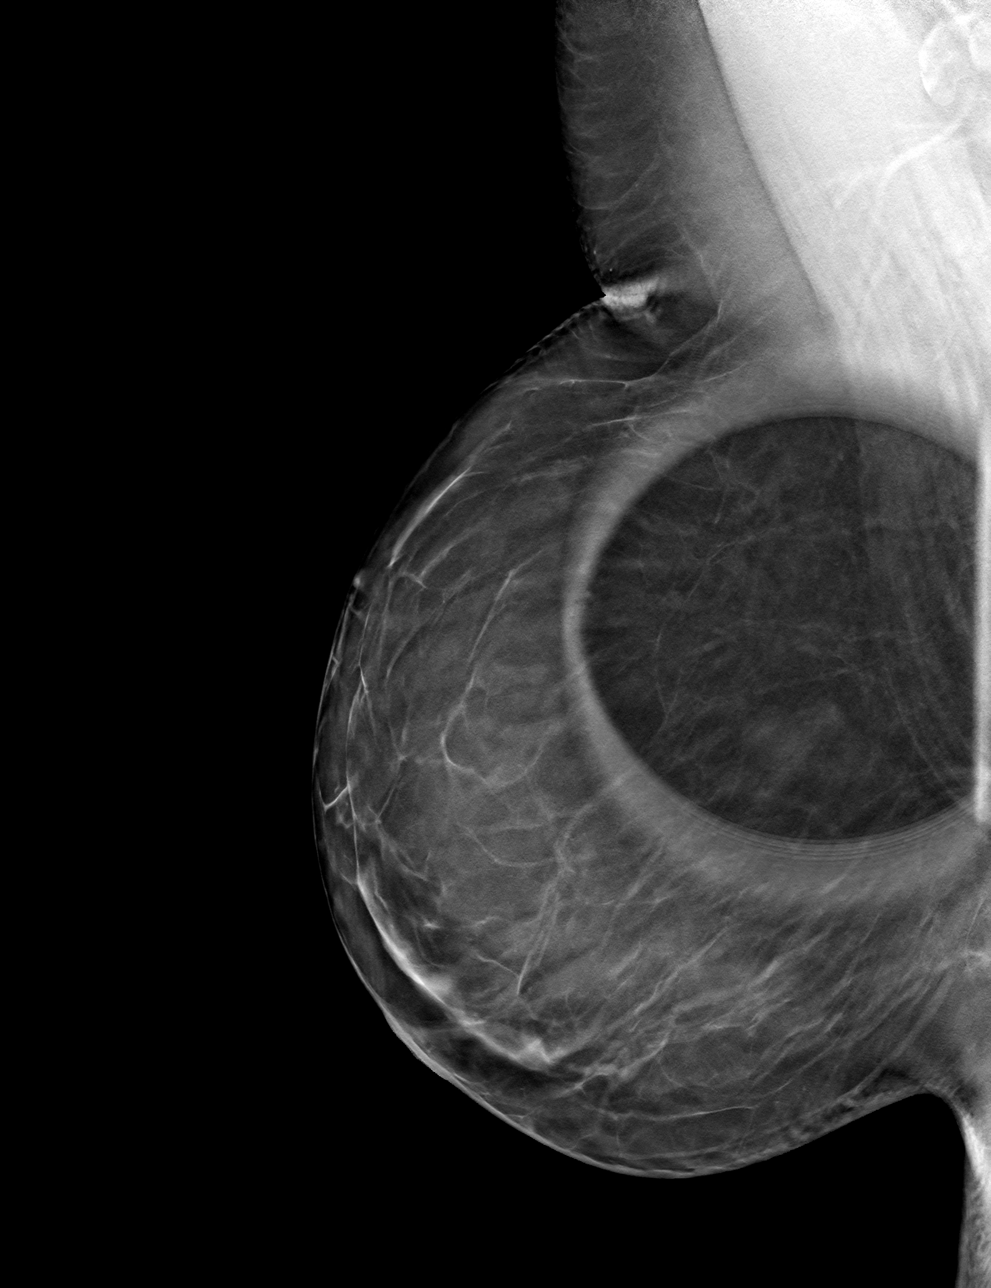

[R CC tomo · tomo slice 30/59.0]
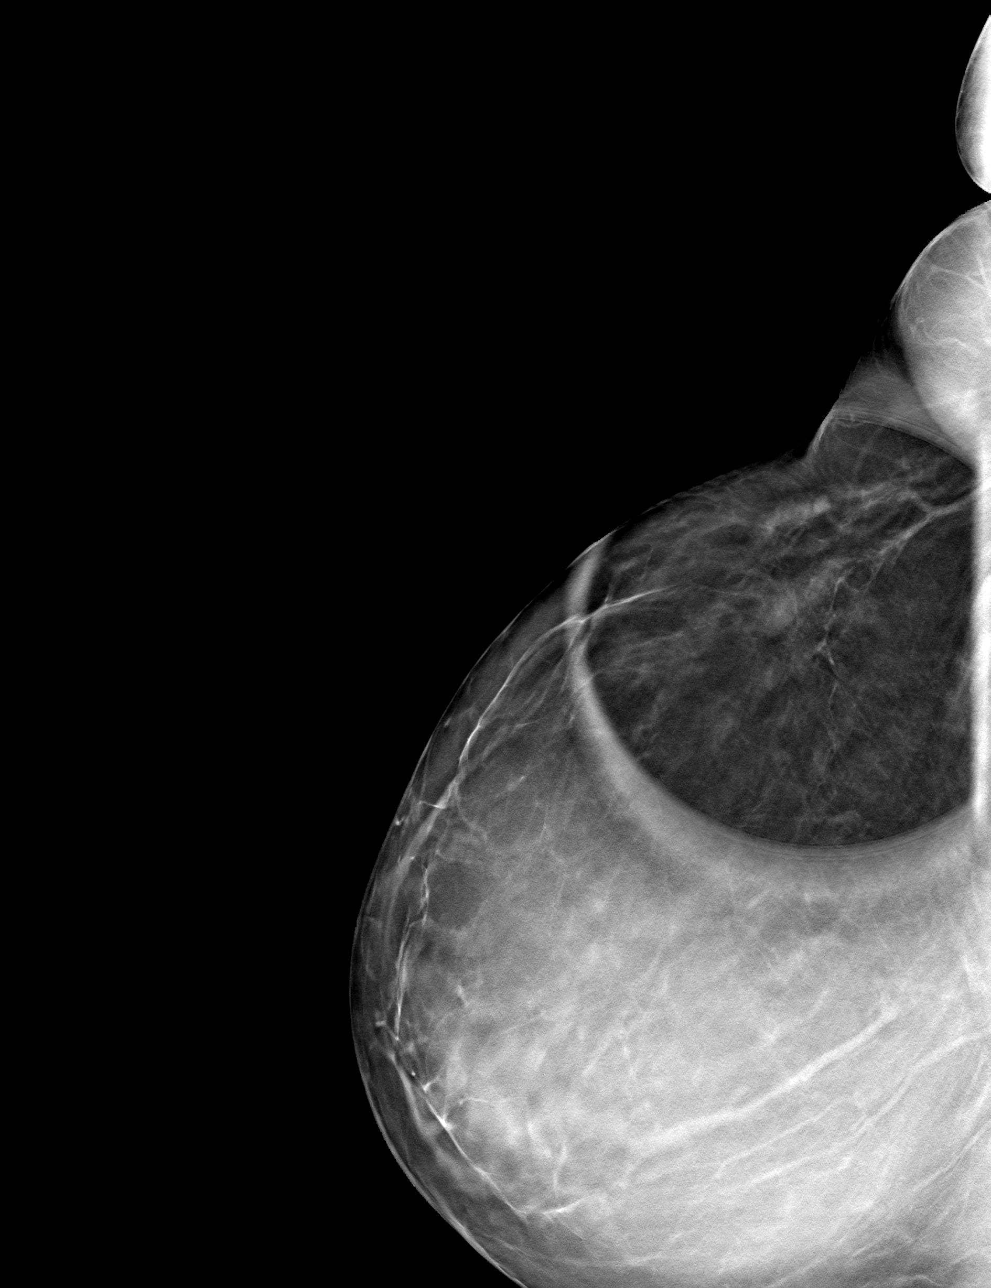

[4 of 12 positions shown; findings below may reference images not displayed]

ACR Breast Density Category b: There are scattered areas of
fibroglandular density.
FINDINGS: Additional 2-D and 3-D images are performed. These views show
persistent focal asymmetry without mass in the LOWER OUTER QUADRANT
of the RIGHT breast.

Mammographic images were processed with CAD.

On physical exam, there is a faint well-healed scar in the 8 o'clock
location of the RIGHT breast, oriented anti radially (scar marker
was placed radially). I palpate no abnormality in the LOWER OUTER
QUADRANT of the RIGHT breast.

Targeted ultrasound is performed, showing normal appearing
fibroglandular tissue in the LOWER OUTER QUADRANT of the RIGHT
breast. No mass or acoustic shadowing identified.
IMPRESSION: Asymmetry in the LOWER OUTER QUADRANT of the RIGHT breast likely
postoperative change from previous excisional biopsy.

RECOMMENDATION:
Recommend RIGHT diagnostic mammogram and possible RIGHT breast
ultrasound in 6 months.

I have discussed the findings and recommendations with the patient.
If applicable, a reminder letter will be sent to the patient
regarding the next appointment.

BI-RADS CATEGORY  3: Probably benign.

## 2019-12-11 ENCOUNTER — Other Ambulatory Visit: Payer: Self-pay | Admitting: Sports Medicine

## 2019-12-11 ENCOUNTER — Telehealth: Payer: Self-pay | Admitting: Sports Medicine

## 2019-12-11 MED ORDER — MELOXICAM 15 MG PO TABS: 15.0000 mg | ORAL_TABLET | Freq: Every day | ORAL | 0 refills | Status: DC

## 2019-12-11 NOTE — Telephone Encounter (Signed)
Refill sent to pt's pharmacy with notice to make an appt prior to future refills.

## 2019-12-11 NOTE — Addendum Note (Signed)
Addended by: Harriett Sine D on: 12/11/2019 03:58 PM   Modules accepted: Orders

## 2019-12-11 NOTE — Telephone Encounter (Signed)
Patient is calling for a refill on Meloxicam.

## 2020-01-28 ENCOUNTER — Encounter: Payer: Self-pay | Admitting: Gastroenterology

## 2020-03-25 ENCOUNTER — Other Ambulatory Visit (INDEPENDENT_AMBULATORY_CARE_PROVIDER_SITE_OTHER): Payer: 59

## 2020-03-25 ENCOUNTER — Ambulatory Visit (INDEPENDENT_AMBULATORY_CARE_PROVIDER_SITE_OTHER): Payer: 59 | Admitting: Gastroenterology

## 2020-03-25 ENCOUNTER — Other Ambulatory Visit: Payer: Self-pay

## 2020-03-25 ENCOUNTER — Encounter: Payer: Self-pay | Admitting: Gastroenterology

## 2020-03-25 VITALS — BP 132/84 | HR 76 | Ht 66.0 in | Wt 187.0 lb

## 2020-03-25 DIAGNOSIS — R7989 Other specified abnormal findings of blood chemistry: Secondary | ICD-10-CM

## 2020-03-25 DIAGNOSIS — R945 Abnormal results of liver function studies: Secondary | ICD-10-CM

## 2020-03-25 DIAGNOSIS — R932 Abnormal findings on diagnostic imaging of liver and biliary tract: Secondary | ICD-10-CM

## 2020-03-25 LAB — COMPREHENSIVE METABOLIC PANEL
ALT: 41 U/L — ABNORMAL HIGH (ref 0–35)
AST: 28 U/L (ref 0–37)
Albumin: 4.3 g/dL (ref 3.5–5.2)
Alkaline Phosphatase: 106 U/L (ref 39–117)
BUN: 18 mg/dL (ref 6–23)
CO2: 26 mEq/L (ref 19–32)
Calcium: 9.4 mg/dL (ref 8.4–10.5)
Chloride: 100 mEq/L (ref 96–112)
Creatinine, Ser: 0.59 mg/dL (ref 0.40–1.20)
GFR: 108.23 mL/min (ref >60.00)
Glucose, Bld: 81 mg/dL (ref 70–99)
Potassium: 4.2 mEq/L (ref 3.5–5.1)
Sodium: 136 mEq/L (ref 135–145)
Total Bilirubin: 0.6 mg/dL (ref 0.2–1.2)
Total Protein: 8.1 g/dL (ref 6.0–8.3)

## 2020-03-25 LAB — CBC WITH DIFFERENTIAL/PLATELET
Basophils Absolute: 0 10*3/uL (ref 0.0–0.1)
Basophils Relative: 0.5 % (ref 0.0–3.0)
Eosinophils Absolute: 0.1 10*3/uL (ref 0.0–0.7)
Eosinophils Relative: 1.1 % (ref 0.0–5.0)
HCT: 36.7 % (ref 36.0–46.0)
Hemoglobin: 12.4 g/dL (ref 12.0–15.0)
Lymphocytes Relative: 18.1 % (ref 12.0–46.0)
Lymphs Abs: 1.6 10*3/uL (ref 0.7–4.0)
MCHC: 33.9 g/dL (ref 30.0–36.0)
MCV: 86 fl (ref 78.0–100.0)
Monocytes Absolute: 0.7 10*3/uL (ref 0.1–1.0)
Monocytes Relative: 7.7 % (ref 3.0–12.0)
Neutro Abs: 6.3 10*3/uL (ref 1.4–7.7)
Neutrophils Relative %: 72.6 % (ref 43.0–77.0)
Platelets: 263 10*3/uL (ref 150.0–400.0)
RBC: 4.26 Mil/uL (ref 3.87–5.11)
RDW: 14.3 % (ref 11.5–15.5)
WBC: 8.7 10*3/uL (ref 4.0–10.5)

## 2020-03-25 LAB — PROTIME-INR
INR: 0.9 ratio (ref 0.8–1.0)
Prothrombin Time: 10.4 s (ref 9.6–13.1)

## 2020-03-25 NOTE — Progress Notes (Signed)
Chief Complaint: Abnormal LFTs  Referring Provider:  Nicoletta Dress, MD      ASSESSMENT AND PLAN;    1. Abn LFTs with abn CT. 2. H/O morbid obesity s/p RYGB 2008.  Patient at risk for liver cirrhosis.  Plan: -Start exercising and reduce weight.  Exercise like walking 30 min/day, at least 3 x week.  -Change diet to more grilled foods rather than fried foods. Can try Mediterranean diet. -Avoid foods with high fructose corn syrup. -MRI liver with contrast -In future,  [place] with Hepatic elastography vs liver biopsy.  At the present time she would like to hold off on both.  I do agree. -Can have up to 2 cups of coffee per day. -Vitamin E 400 IU po QD. -Check CBC, CMP, PT INR, AFP, acute hepatitis profile, autoimmune hepatitis profile (including ASMA, AMA), GGT, ceruloplasmin, A1AT.  Also check anti-HAV total Ab and HBsAb titer.  If neg, would need vaccination for Hep A and B. -FU in 12 weeks.  Aim is to reduce 6 lbs over next 12 weeks. -Discussed extensively with the patient and patient's husband.    HPI:    Angela Miles is a 49 y.o. female  With H/O RYGB 2008 (with resultant 129lb wt loss), anxiety/depression, HTN, mild obesity  Referred to GI clinic for abn LFTs: AST 56, ALT 106, alk phos 132 with normal CBC hemoglobin 13.0, platelets 312.  Note that iron studies were normal. CT AP June 2021 showing significant heterogenous enhancement pattern throughout the liver which could be related to intrinsic liver disease.  No definite liver cirrhosis.  No H/O itching, skin lesions, intake of OTC meds including diet pills, herbal medications, anabolic steroids or Tylenol. There is no H/O blood transfusions, IVDA or FH of liver disease. No jaundice, dark urine or pale stools. No alcohol abuse.  Does have history of easy bruisability.  Does have history of constipation. Neg colonoscopy by Dr. Melina Copa in 2017.  Repeat in 10 years.  No nausea, vomiting, heartburn, regurgitation,  odynophagia or dysphagia.  No significant diarrhea or constipation. No melena or hematochezia. No unintentional weight loss. No abdominal pain.   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 Anemia   . Asthma    bronchitis  . Cancer [facility])     Past Surgical History:  Procedure Laterality Date  . BREAST LUMPECTOMY WITH NEEDLE LOCALIZATION Right 02/26/2013   Procedure: BREAST LUMPECTOMY WITH NEEDLE LOCALIZATION;  Surgeon: Marcello Moores A. Cornett, MD;  Location: [facility];  Service: General;  Laterality: Right;  NEEDLE LOCALIZATION AT [place] 7;30   . CERVICAL BIOPSY    . CESAREAN SECTION  02,05  . CHOLECYSTECTOMY    . COLONOSCOPY     Dr Orlena Sheldon 2-14 around 2017  . DILATION AND CURETTAGE OF UTERUS  2009   uterine ablation  . ESOPHAGOGASTRODUODENOSCOPY     around 08  . GASTRIC BYPASS  08    Family History  Problem Relation Age of Onset  . Colon polyps Father   . Colon cancer Paternal Barbaraann Rondo        has a colestomy bag now. Around 60's  . Esophageal cancer Neg Hx     Social History   Tobacco Use  . Smoking status: Former Smoker    Quit date: 02/11/2009    Years since quitting: 11.1  . Smokeless tobacco: Never Used  Vaping Use  .  Vaping Use: Never used  Substance Use Topics  . Alcohol use: Yes    Comment: rare  . Drug use: No    Current Outpatient Medications  Medication Sig Dispense Refill  . MAGNESIUM PO Take 483 mg by mouth at bedtime.    Marland Kitchen MELATONIN GUMMIES PO Take by mouth at bedtime.    . meloxicam (MOBIC) 15 MG tablet Take 1 tablet (15 mg total) by mouth daily. (Patient taking differently: Take 15 mg by mouth as needed. ) 30 tablet 0   No current facility-administered medications for this visit.    Allergies  Allergen Reactions  . Penicillins Nausea Only  . Sulfa Antibiotics Palpitations    Review of Systems:  Constitutional: Denies fever, chills, diaphoresis,  appetite change and fatigue.  HEENT: Denies photophobia, eye pain, redness, hearing loss, ear pain, congestion, sore throat, rhinorrhea, sneezing, mouth sores, neck pain, neck stiffness and tinnitus.   Respiratory: Denies SOB, DOE, cough, chest tightness,  and wheezing.   Cardiovascular: Denies chest pain, palpitations and leg swelling.  Genitourinary: Denies dysuria, urgency, frequency, hematuria, flank pain and difficulty urinating.  Musculoskeletal: Denies myalgias, back pain, joint swelling, arthralgias and gait problem.  Skin: No rash.  Neurological: Denies dizziness, seizures, syncope, weakness, light-headedness, numbness and headaches.  Hematological: Denies adenopathy. Easy bruising, personal or family bleeding history  Psychiatric/Behavioral: Has anxiety or depression     Physical Exam:    BP 132/84   Pulse 76   Ht '5\' 6"'  (1.676 m)   Wt 187 lb (84.8 kg)   BMI 30.18 kg/m  Wt Readings from Last 3 Encounters:  03/25/20 187 lb (84.8 kg)  03/15/13 191 lb (86.6 kg)  02/26/13 198 lb 2 oz (89.9 kg)   Constitutional:  Well-developed, in no acute distress. Psychiatric: Normal mood and affect. Behavior is normal. HEENT: Pupils normal.  Conjunctivae are normal. No scleral icterus. Neck supple.  Cardiovascular: Normal rate, regular rhythm. No edema Pulmonary/chest: Effort normal and breath sounds normal. No wheezing, rales or rhonchi. Abdominal: Soft, nondistended. Nontender. Bowel sounds active throughout. There are no masses palpable. Has hepatomegaly -liver palpated 5 cm below the costal margin. Rectal:  defered Neurological: Alert and oriented to person place and time. Skin: Skin is warm and dry. No rashes noted.  Data Reviewed: I have personally reviewed following labs and imaging studies  CBC: CBC Latest Ref Rng & Units 02/26/2013 02/22/2013  WBC 4.0 - 10.5 K/uL - 8.0  Hemoglobin 12.0 - 15.0 g/dL 11.4(L) 10.2(L)  Hematocrit 36 - 46 % - 31.8(L)  Platelets 150 - 400 K/uL -  238    CMP: CMP Latest Ref Rng & Units 02/22/2013  Glucose 70 - 99 mg/dL 105(H)  BUN 6 - 23 mg/dL 9  Creatinine 0.50 - 1.10 mg/dL 0.48(L)  Sodium 135 - 145 mEq/L 137  Potassium 3.5 - 5.1 mEq/L 4.3  Chloride 96 - 112 mEq/L 104  CO2 19 - 32 mEq/L 26  Calcium 8.4 - 10.5 mg/dL 8.8  Total Protein 6.0 - 8.3 g/dL 6.7  Total Bilirubin 0.3 - 1.2 mg/dL 0.4  Alkaline Phos 39 - 117 U/L 75  AST 0 - 37 U/L 15  ALT 0 - 35 U/L 16  CT scan Abdo/pelvis was reviewed with the patient and patient's husband.   Carmell Austria, MD 03/25/2020, 9:08 AM  Cc: Nicoletta Dress, MD

## 2020-03-25 NOTE — Patient Instructions (Signed)
If you are age 49 or older, your body mass index should be between 23-30. Your Body mass index is 30.18 kg/m. If this is out of the aforementioned range listed, please consider follow up with your Primary Care Provider.  If you are age 43 or younger, your body mass index should be between 19-25. Your Body mass index is 30.18 kg/m. If this is out of the aformentioned range listed, please consider follow up with your Primary Care Provider.   Start exercising and reduce weight. Walk 30 minutes daily at least three times a week. Record weight everyday and bring back with you at your next visit. Change diet to more grilled foods rather than fried foods. Try mediterranean diet. Aim to lose 6 pounds over the next 3 months. Avoid foods with high fructose.  Can have up to 2 cups of coffee per day.  Start Vitamin E 400 iu daily. ( this is over-the-counter)  Follow up in three months. Please call the office for an appointment as the schedule is not available at this time.  Your provider has requested that you go to [address], [place], [place] for lab work today. Press "B" on the elevator. The lab is located at the first door on the left as you exit the elevator.  You have been scheduled for an MRI at          on        . Your appointment time is         . Please arrive 15 minutes prior to your appointment time for registration purposes. Please make certain not to have anything to eat or drink 6 hours prior to your test. In addition, if you have any metal in your body, have a pacemaker or defibrillator, please be sure to let your ordering physician know. This test typically takes 45 minutes to 1 hour to complete. Should you need to reschedule, please call [phone] to do so.  Thank you,  Dr. Jackquline Denmark

## 2020-03-26 ENCOUNTER — Telehealth: Payer: Self-pay | Admitting: Gastroenterology

## 2020-03-26 NOTE — Telephone Encounter (Signed)
I doubt if leg cramps are related to vitamin E Certainly, can hold off on vitamin E for few days to see if cramps get better Also may need Dr. Denton Lank opinion to determine the reason for leg cramps  RG

## 2020-03-26 NOTE — Telephone Encounter (Signed)
Patient was seen in office 03/25/20 and had complaints of leg cramps at night. She was put on Vitamin E 400 IU daily. Patient would like to know if Dr Lyndel Safe would order a muscle relaxer to take at night. She states she is not sleeping due to the pain. Please advise

## 2020-03-26 NOTE — Telephone Encounter (Signed)
Spoke to patient who stated she will contact her PCP for inquiring about taking a muscle relaxer. She will continue the Vitamine E for now to see if  her leg cramps improve.

## 2020-03-28 LAB — ANTI-SMOOTH MUSCLE ANTIBODY, IGG: Actin (Smooth Muscle) Antibody (IGG): <20 U (ref <20)

## 2020-03-28 LAB — MITOCHONDRIAL ANTIBODIES: Mitochondrial M2 Ab, IgG: 73 U — ABNORMAL HIGH

## 2020-03-28 LAB — TISSUE TRANSGLUTAMINASE, IGA: (tTG) Ab, IgA: 1 U/mL

## 2020-03-28 LAB — AFP TUMOR MARKER: AFP-Tumor Marker: 2.7 ng/mL

## 2020-03-28 LAB — HEPATITIS B SURFACE ANTIBODY,QUALITATIVE: Hep B S Ab: NONREACTIVE

## 2020-03-28 LAB — CERULOPLASMIN: Ceruloplasmin: 43 mg/dL (ref 18–53)

## 2020-03-28 LAB — HEPATITIS A ANTIBODY, TOTAL: Hepatitis A AB,Total: NONREACTIVE

## 2020-03-28 LAB — ALPHA-1-ANTITRYPSIN: A-1 Antitrypsin, Ser: 187 mg/dL (ref 83–199)

## 2020-04-01 ENCOUNTER — Ambulatory Visit (HOSPITAL_COMMUNITY)
Admission: RE | Admit: 2020-04-01 | Discharge: 2020-04-01 | Disposition: A | Discharge status: Home / Self Care | Payer: 59 | Source: Ambulatory Visit | Attending: Gastroenterology | Admitting: Gastroenterology

## 2020-04-01 ENCOUNTER — Other Ambulatory Visit: Payer: Self-pay

## 2020-04-01 ENCOUNTER — Ambulatory Visit (INDEPENDENT_AMBULATORY_CARE_PROVIDER_SITE_OTHER): Payer: 59 | Admitting: Gastroenterology

## 2020-04-01 ENCOUNTER — Other Ambulatory Visit: Payer: Self-pay | Admitting: Gastroenterology

## 2020-04-01 DIAGNOSIS — R945 Abnormal results of liver function studies: Secondary | ICD-10-CM

## 2020-04-01 DIAGNOSIS — R932 Abnormal findings on diagnostic imaging of liver and biliary tract: Secondary | ICD-10-CM

## 2020-04-01 DIAGNOSIS — Z23 Encounter for immunization: Secondary | ICD-10-CM | POA: Diagnosis not present

## 2020-04-01 DIAGNOSIS — R7989 Other specified abnormal findings of blood chemistry: Secondary | ICD-10-CM

## 2020-04-01 DIAGNOSIS — K760 Fatty (change of) liver, not elsewhere classified: Secondary | ICD-10-CM

## 2020-04-01 IMAGING — MR MR ABDOMEN WO/W CM
18 series · 48 of 48 positions shown · IV contrast (8ML GADAVIST)
Comparison: CT abdomen [DATE]

CLINICAL DATA: Abnormal liver function tests.

EXAM:
MRI ABDOMEN WITHOUT AND WITH CONTRAST
TECHNIQUE: Multiplanar multisequence MR imaging of the abdomen was performed
both before and after the administration of intravenous contrast.
CONTRAST:.:
CONTRAST:.
8mL GADAVIST GADOBUTROL 1 MMOL/ML IV SOLN

[Series 3: T2 · coronal · 6.0mm · 1.27mm/px · 2 of 32 slices shown (1 of 2)]
[im 1/32]
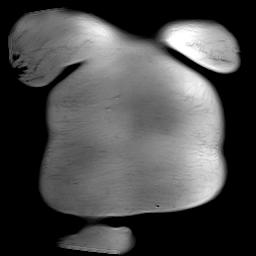
[im 32/32]
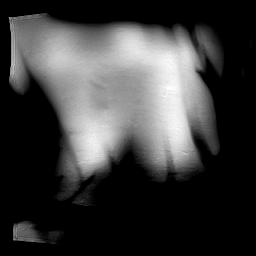

[Series 6: T1 · axial · 3.0mm · 1.02mm/px · z∈[-210,+51]mm · 4 of 88 slices shown (1 of 2)]
[im 1/88]
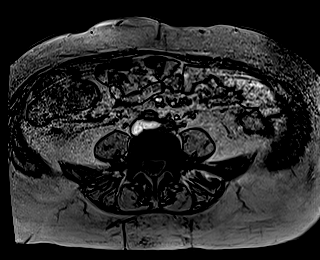
[im 30/88]
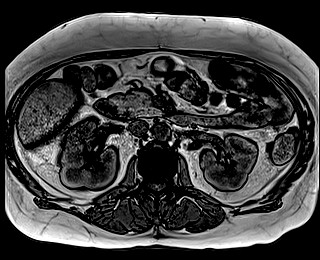
[im 59/88]
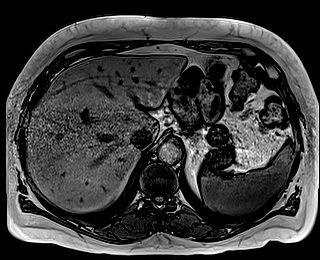
[im 88/88]
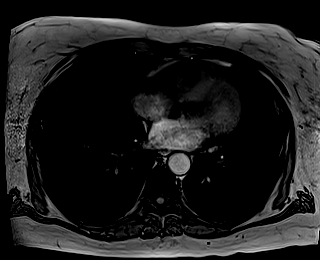

[Series 7: T1 · axial · 3.0mm · 1.02mm/px · z∈[-210,+51]mm · 4 of 88 slices shown (2 of 2)]
[im 1/88]
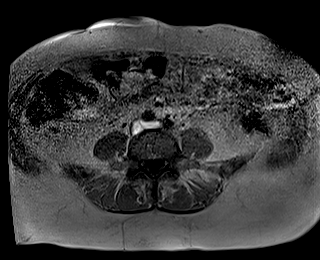
[im 30/88]
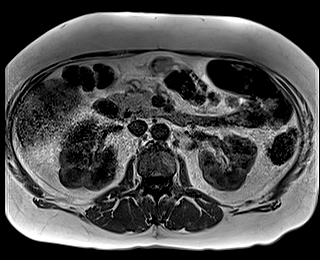
[im 59/88]
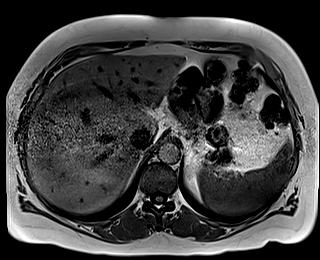
[im 88/88]
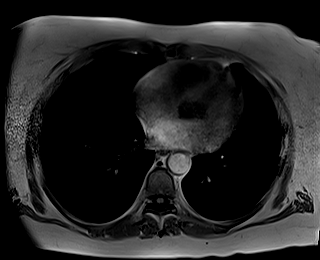

[Series 9: T2 fat-sat · axial · 6.0mm · 1.02mm/px · 1 of 40 slices shown]
[im 1/40]
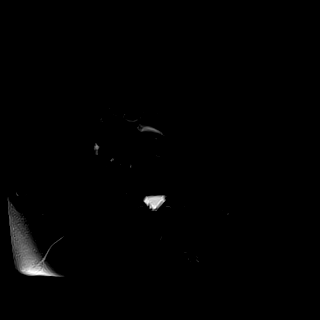

[Series 10: DWI · axial · 6.0mm · 1.36mm/px · z∈[-190,+76]mm · 3 of 76 slices shown (1 of 2)]
[im 1/76]
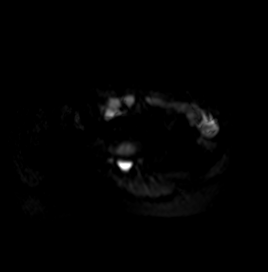
[im 38/76]
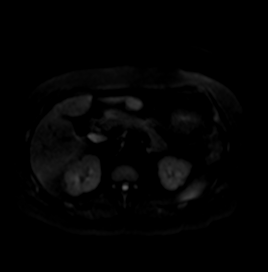
[im 76/76]
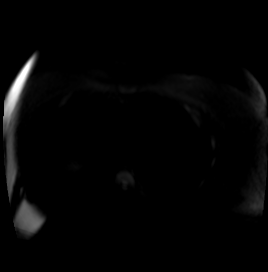

[Series 11: DWI · axial · 6.0mm · 1.36mm/px · 1 of 38 slices shown (2 of 2)]
[im 1/38]
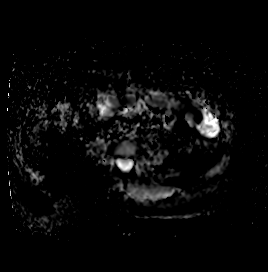

[Series 13: T1 dynamic · axial · 3.0mm · 1.02mm/px · z∈[-203,+58]mm · 3 of 88 slices shown (1 of 10)]
[im 1/88]
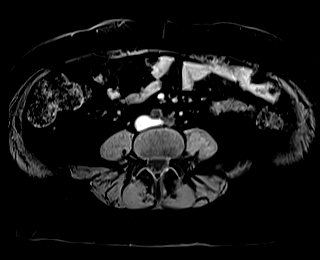
[im 44/88]
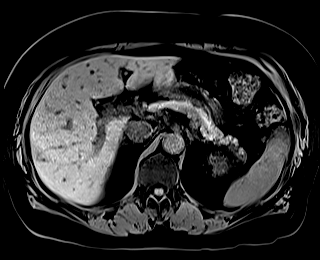
[im 88/88]
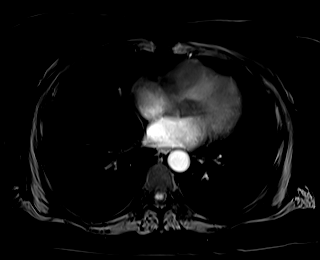

[Series 14: bSSFP · axial · 4.0mm · 0.63mm/px · z∈[-201,+54]mm · 2 of 65 slices shown]
[im 1/65]
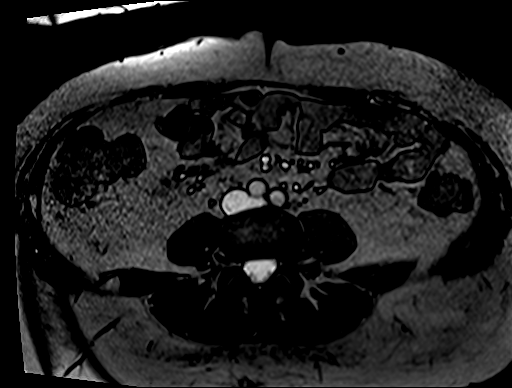
[im 65/65]
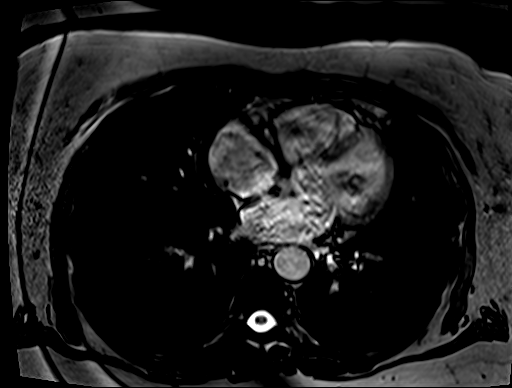

[Series 18: T1 dynamic · axial · 3.0mm · 1.02mm/px · z∈[-203,+58]mm · 3 of 88 slices shown (2 of 10)]
[im 1/88]
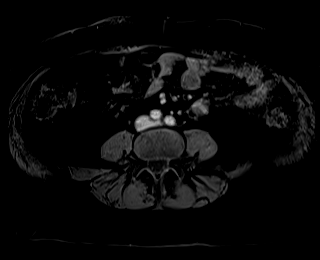
[im 44/88]
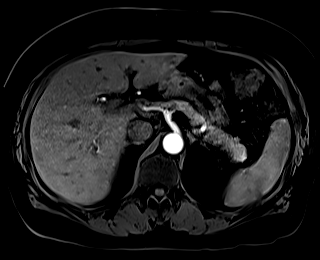
[im 88/88]
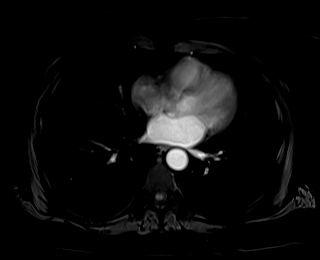

[Series 19: T1 dynamic · axial · 3.0mm · 1.02mm/px · z∈[-203,+58]mm · 3 of 88 slices shown (3 of 10)]
[im 1/88]
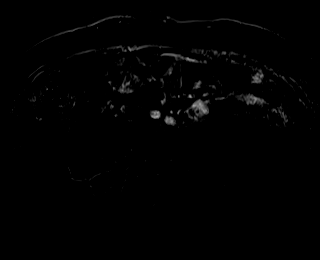
[im 44/88]
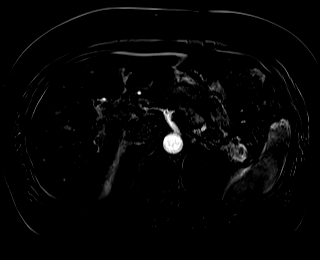
[im 88/88]
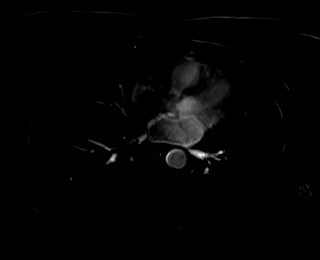

[Series 22: T1 dynamic · axial · 3.0mm · 1.02mm/px · z∈[-203,+58]mm · 3 of 88 slices shown (4 of 10)]
[im 1/88]
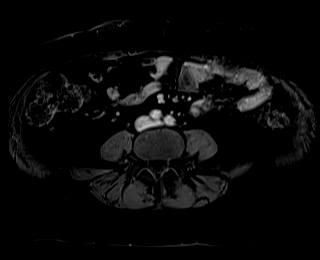
[im 44/88]
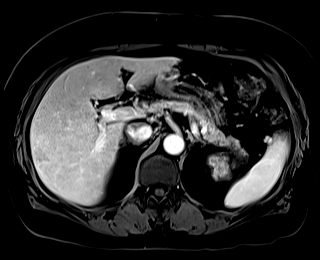
[im 88/88]
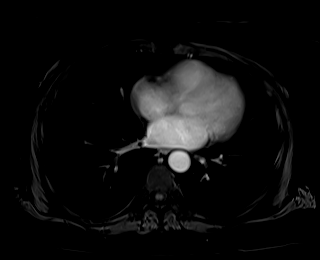

[Series 23: T1 dynamic · axial · 3.0mm · 1.02mm/px · z∈[-203,+58]mm · 3 of 88 slices shown (5 of 10)]
[im 1/88]
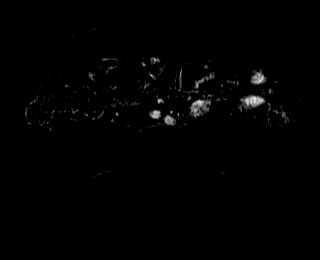
[im 44/88]
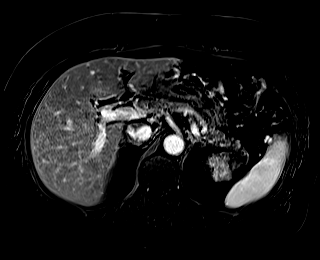
[im 88/88]
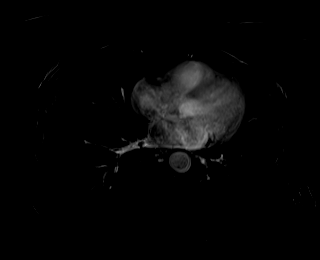

[Series 26: T1 dynamic · axial · 3.0mm · 1.02mm/px · z∈[-203,+58]mm · 3 of 88 slices shown (6 of 10)]
[im 1/88]
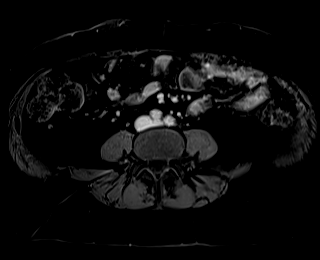
[im 44/88]
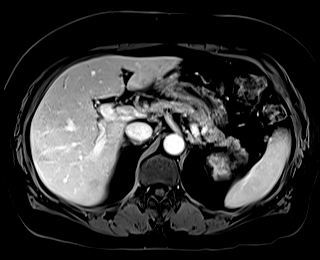
[im 88/88]
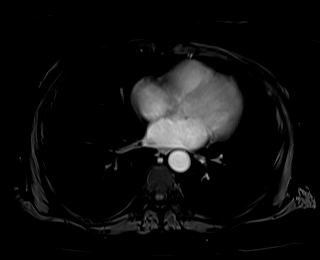

[Series 27: T1 dynamic · axial · 3.0mm · 1.02mm/px · z∈[-203,+58]mm · 3 of 88 slices shown (7 of 10)]
[im 1/88]
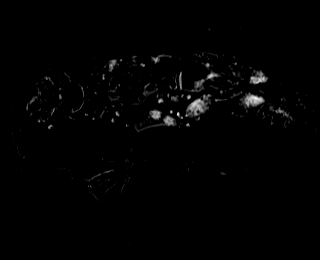
[im 44/88]
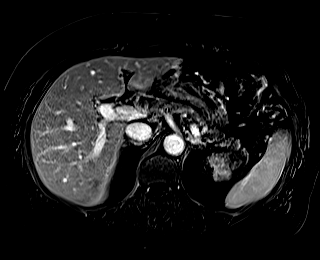
[im 88/88]
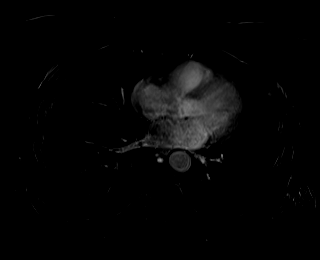

[Series 29: T1 dynamic · coronal · 3.0mm · 1.02mm/px · 3 of 80 slices shown (8 of 10)]
[im 1/80]
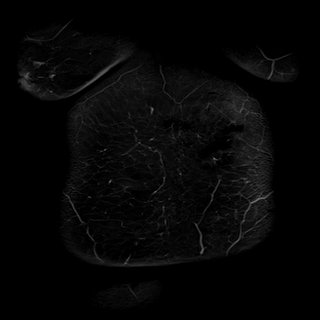
[im 40/80]
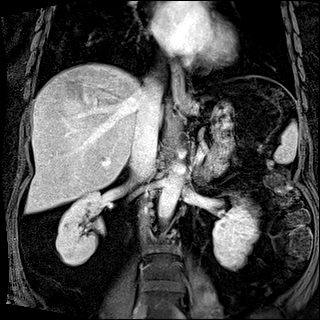
[im 80/80]
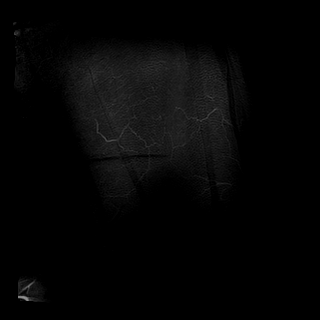

[Series 30: T2 · axial · 6.0mm · 1.27mm/px · 1 of 36 slices shown (2 of 2)]
[im 1/36]
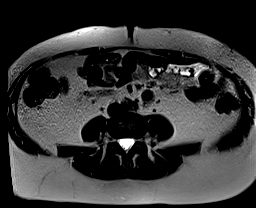

[Series 33: T1 dynamic · axial · 3.0mm · 1.02mm/px · z∈[-203,+58]mm · 3 of 88 slices shown (9 of 10)]
[im 1/88]
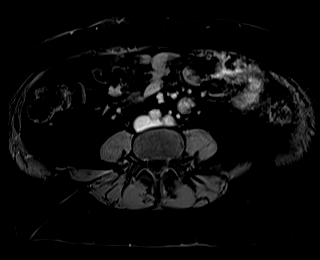
[im 44/88]
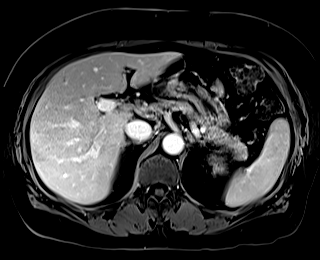
[im 88/88]
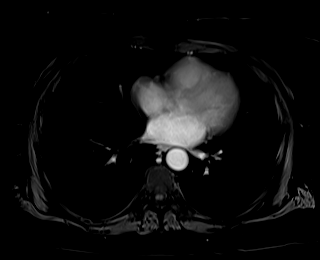

[Series 34: T1 dynamic · axial · 3.0mm · 1.02mm/px · z∈[-203,+58]mm · 3 of 88 slices shown (10 of 10)]
[im 1/88]
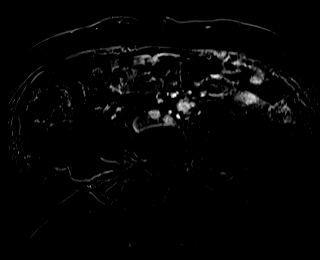
[im 44/88]
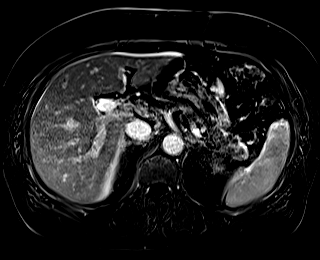
[im 88/88]
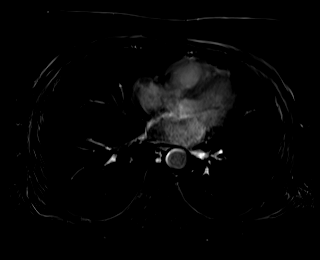

[48 of 48 positions shown; findings below may reference images not displayed]

FINDINGS: Lower chest: No acute findings.

Hepatobiliary: No suspicious focal liver abnormalities identified.
Previous cholecystectomy. Common bile duct is upper limits of normal
in caliber status post cholecystectomy measuring 6 mm. No
choledocholithiasis identified.

Pancreas: 6 mm T2 hyperintense cystic structure associated with the
neck of pancreas is identified, image [DATE]. No abnormal enhancement
associated with this structure. The remainder of the pancreas is
unremarkable. No main duct dilatation, inflammation or enhancing
mass.

Spleen:  Within normal limits in size and appearance.

Adrenals/Urinary Tract: Normal appearance of the adrenal glands. No
hydronephrosis identified bilaterally. Multiple bilateral
subcentimeter T2 hyperintense kidney lesions are noted. These are
technically too small to reliably characterize. The largest arises
from the anterior cortex of the inferior pole of the right kidney
measuring 8 mm, image 74/6. This may be mildly complicated by a thin
internal area of septation. No solid enhancing kidney lesion
identified bilaterally

Stomach/Bowel: Visualized portions within the abdomen are
unremarkable.

Vascular/Lymphatic: No pathologically enlarged lymph nodes
identified. No abdominal aortic aneurysm demonstrated.

Other:  No free fluid or fluid collections.

Musculoskeletal: No suspicious bone lesions identified.
IMPRESSION: 1. No acute findings within the abdomen.
2. Previous cholecystectomy.
3. Small 6 mm cystic structure associated with the neck of pancreas
is identified. This is technically too small to further
characterize. Follow-up imaging with repeat MRI in 12 months without
and with contrast material recommended. This recommendation follows
ACR consensus guidelines: Management of Incidental Pancreatic Cysts:
A White Paper of the ACR Incidental Findings Committee. [HOSPITAL] [ZS];[DATE].
4. Bilateral subcentimeter T2 hyperintense kidney lesions are
technically too small to reliably characterize. The largest is in
the anterior cortex of the inferior pole of the right kidney
measuring 8 mm. This may be mildly complex containing a thin
internal area of septation. Attention on follow-up MRI is
recommended.

## 2020-04-01 MED ORDER — GADOBUTROL 1 MMOL/ML IV SOLN
8.0000 mL | Freq: Once | INTRAVENOUS | Status: AC | PRN
  Administered 2020-04-01: 8 mL via INTRAVENOUS

## 2020-04-08 ENCOUNTER — Other Ambulatory Visit: Payer: Self-pay | Admitting: Physician Assistant

## 2020-04-09 ENCOUNTER — Other Ambulatory Visit: Payer: Self-pay | Admitting: Gastroenterology

## 2020-04-09 ENCOUNTER — Other Ambulatory Visit: Payer: Self-pay

## 2020-04-09 ENCOUNTER — Ambulatory Visit (HOSPITAL_COMMUNITY)
Admission: RE | Admit: 2020-04-09 | Discharge: 2020-04-09 | Disposition: A | Discharge status: Home / Self Care | Payer: 59 | Source: Ambulatory Visit | Attending: Gastroenterology | Admitting: Gastroenterology

## 2020-04-09 ENCOUNTER — Encounter (HOSPITAL_COMMUNITY): Payer: Self-pay

## 2020-04-09 DIAGNOSIS — K7401 Hepatic fibrosis, early fibrosis: Secondary | ICD-10-CM | POA: Insufficient documentation

## 2020-04-09 DIAGNOSIS — R932 Abnormal findings on diagnostic imaging of liver and biliary tract: Secondary | ICD-10-CM

## 2020-04-09 DIAGNOSIS — K760 Fatty (change of) liver, not elsewhere classified: Secondary | ICD-10-CM | POA: Diagnosis not present

## 2020-04-09 DIAGNOSIS — R945 Abnormal results of liver function studies: Secondary | ICD-10-CM | POA: Diagnosis not present

## 2020-04-09 DIAGNOSIS — R7989 Other specified abnormal findings of blood chemistry: Secondary | ICD-10-CM | POA: Diagnosis not present

## 2020-04-09 DIAGNOSIS — K743 Primary biliary cirrhosis: Secondary | ICD-10-CM | POA: Insufficient documentation

## 2020-04-09 LAB — PROTIME-INR
INR: 0.9 (ref 0.8–1.2)
Prothrombin Time: 11.4 seconds (ref 11.4–15.2)

## 2020-04-09 LAB — CBC
HCT: 39 % (ref 36.0–46.0)
Hemoglobin: 12.1 g/dL (ref 12.0–15.0)
MCH: 28.7 pg (ref 26.0–34.0)
MCHC: 31 g/dL (ref 30.0–36.0)
MCV: 92.6 fL (ref 80.0–100.0)
Platelets: 268 10*3/uL (ref 150–400)
RBC: 4.21 MIL/uL (ref 3.87–5.11)
RDW: 14.1 % (ref 11.5–15.5)
WBC: 7.5 10*3/uL (ref 4.0–10.5)
nRBC: 0 % (ref 0.0–0.2)

## 2020-04-09 IMAGING — US US BIOPSY CORE LIVER
1 series · 9 of 9 positions shown · non-contrast
Comparison: none

INDICATION: Abnormal LFTs, hepatic steatosis

[Series 1: us biopsy core liver · 9 of 9 slices shown]
[im 1/9]
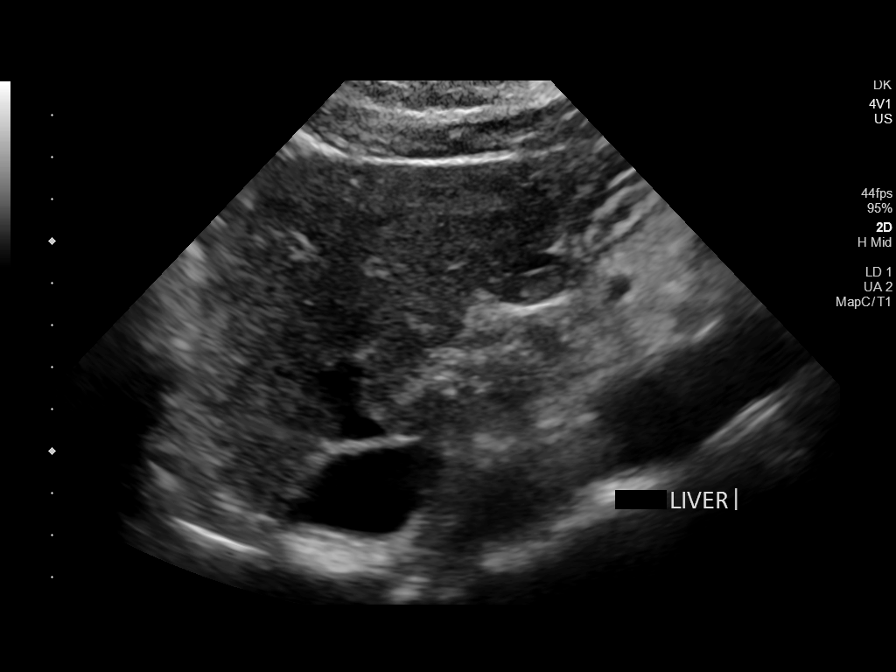
[im 2/9]
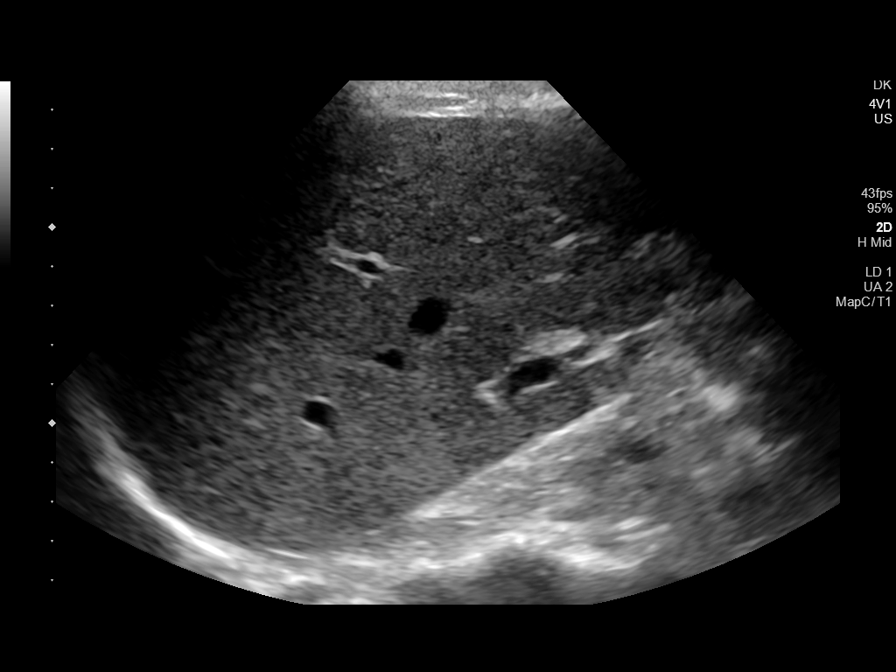
[im 3/9]
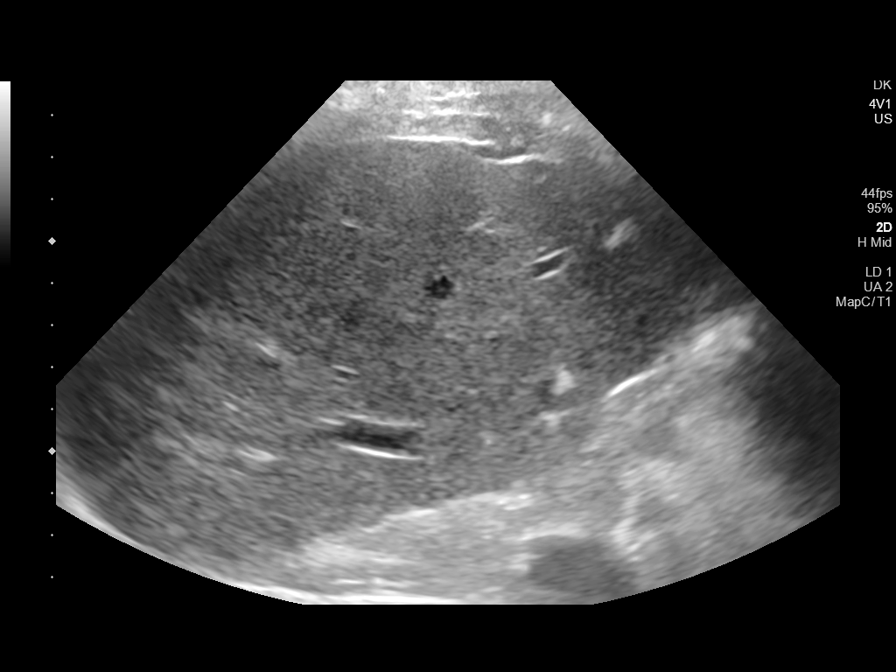
[im 4/9]
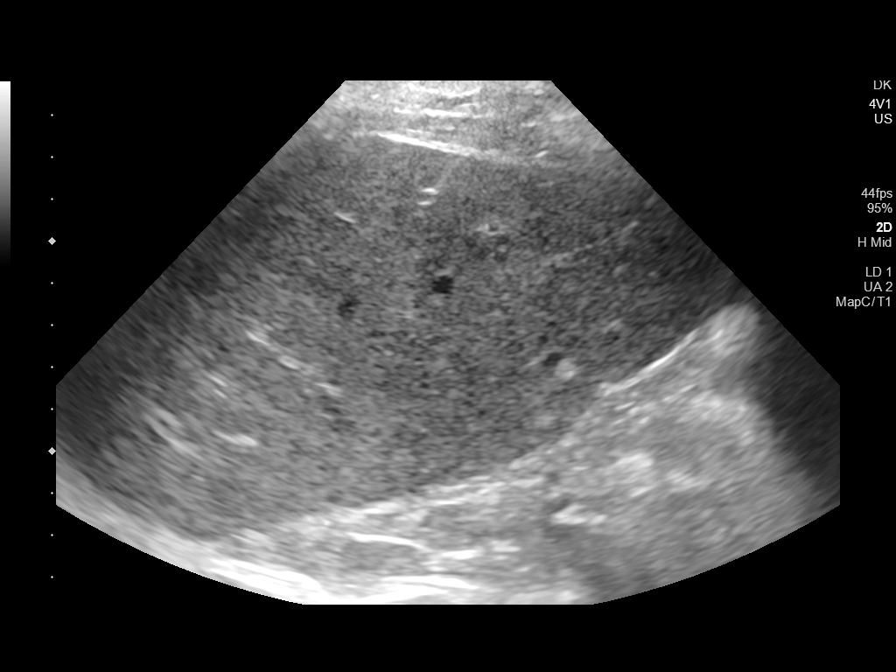
[im 5/9]
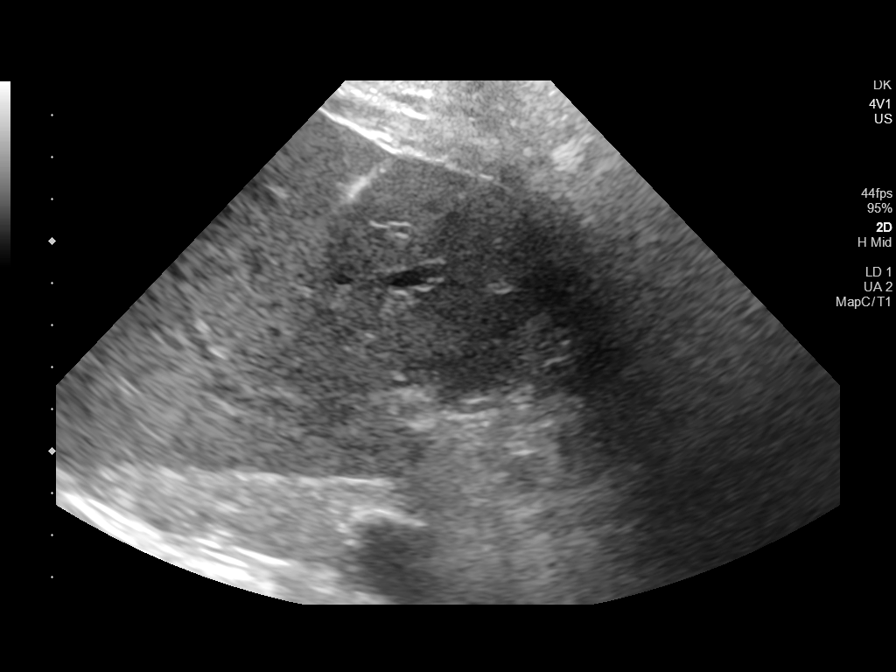
[im 6/9]
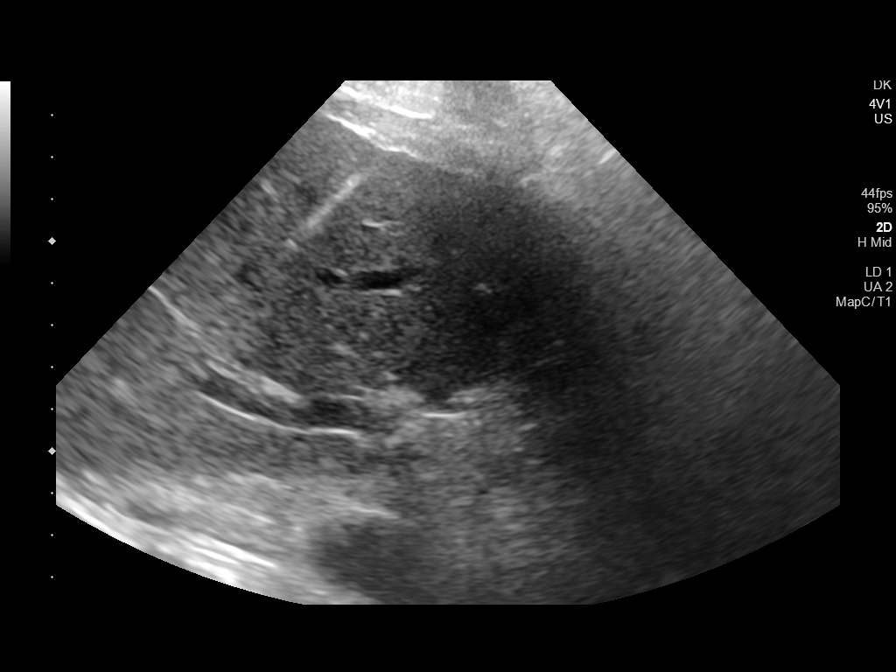
[im 7/9]
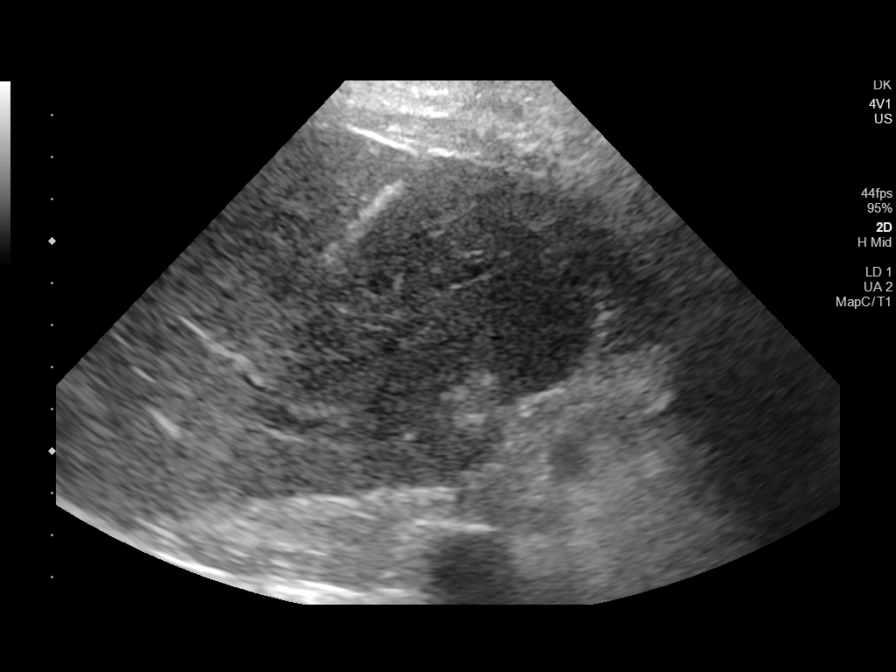
[im 8/9]
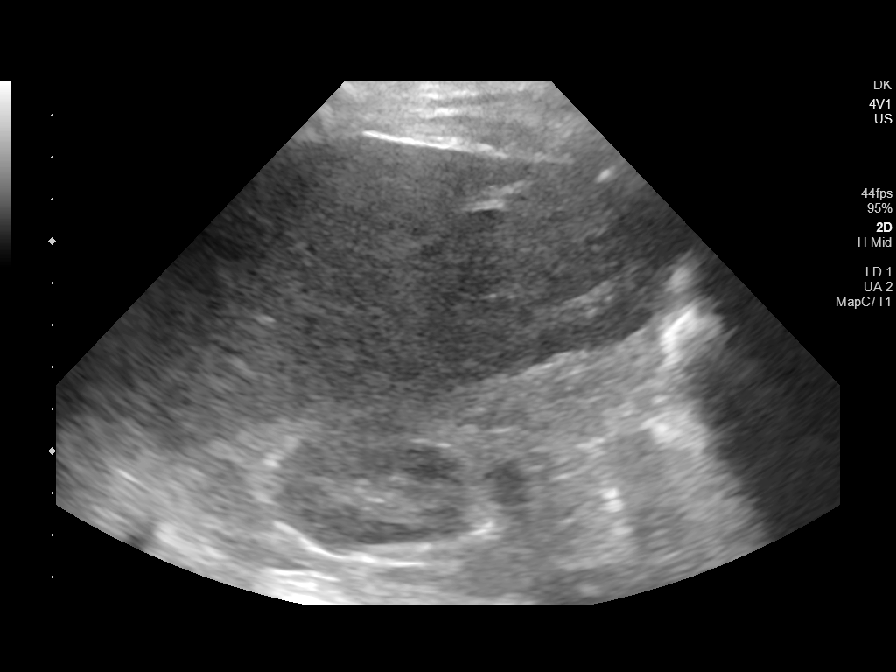
[im 9/9]
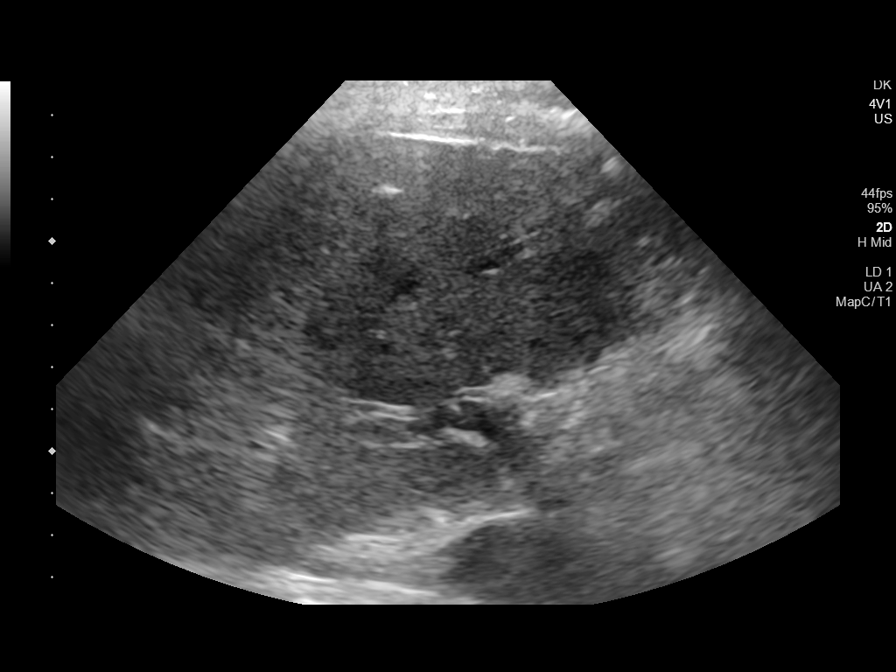

[9 of 9 positions shown; findings below may reference images not displayed]

EXAM:
ULTRASOUND RANDOM RIGHT LIVER CORE BIOPSY

MEDICATIONS:
1% lidocaine

ANESTHESIA/SEDATION:
Moderate (conscious) sedation was employed during this procedure. A
total of Versed 3.0 mg administered intravenously.

Moderate Sedation Time: 10 minutes. The patient's level of
consciousness and vital signs were monitored continuously by
radiology nursing throughout the procedure under my direct
supervision.

FLUOROSCOPY TIME:  Fluoroscopy Time: None.

COMPLICATIONS:
None immediate.

PROCEDURE:
Informed written consent was obtained from the patient after a
thorough discussion of the procedural risks, benefits and
alternatives. All questions were addressed. Maximal Sterile Barrier
Technique was utilized including caps, mask, sterile gowns, sterile
gloves, sterile drape, hand hygiene and skin antiseptic. A timeout
was performed prior to the initiation of the procedure.

Previous imaging reviewed. Preliminary ultrasound performed. The
right liver was localized in the mid axillary line through a lower
intercostal space. Overlying skin marked.

Under sterile conditions and local anesthesia, ultrasound guidance
was utilized to advance a 17 gauge 6.8 cm access needle into the
right liver. Needle position confirmed with ultrasound. Images
obtained for documentation. 2 18 gauge core biopsies obtained.
Samples were intact and non fragmented. These were placed in
formalin. Needle tract occluded with Gel-Foam. Postprocedure imaging
demonstrates no hemorrhage or hematoma. Patient tolerated biopsy
well.
IMPRESSION: Successful ultrasound right liver random core biopsy.

## 2020-04-09 MED ORDER — MIDAZOLAM HCL 2 MG/2ML IJ SOLN
INTRAMUSCULAR | Status: AC | PRN
  Administered 2020-04-09: 1 mg via INTRAVENOUS

## 2020-04-09 MED ORDER — FENTANYL CITRATE (PF) 100 MCG/2ML IJ SOLN
INTRAMUSCULAR | Status: AC | PRN
  Administered 2020-04-09: 50 ug via INTRAVENOUS

## 2020-04-09 MED ORDER — MIDAZOLAM HCL 2 MG/2ML IJ SOLN
INTRAMUSCULAR | Status: AC
  Filled 2020-04-09: qty 4

## 2020-04-09 MED ORDER — SODIUM CHLORIDE 0.9 % IV SOLN: INTRAVENOUS | Status: DC

## 2020-04-09 MED ORDER — FENTANYL CITRATE (PF) 100 MCG/2ML IJ SOLN
INTRAMUSCULAR | Status: AC
  Filled 2020-04-09: qty 2

## 2020-04-09 MED ORDER — LIDOCAINE HCL 1 % IJ SOLN
INTRAMUSCULAR | Status: AC
  Filled 2020-04-09: qty 20

## 2020-04-09 MED ORDER — GELATIN ABSORBABLE 12-7 MM EX MISC
CUTANEOUS | Status: AC
  Filled 2020-04-09: qty 1

## 2020-04-09 NOTE — Consult Note (Signed)
Chief Complaint: Patient was seen in consultation today for  image guided random core liver biopsy  Referring Physician(s): Gupta,Rajesh  Supervising Physician: Daryll Brod  Patient Status: [facility] - Out-pt  History of Present Illness: Angela Miles is a 49 y.o. female with hx anemia, HTN, elevated LFT's , prior RYGB 2008,  prior cholecystectomy, AMA positive, fatty liver. She presents today for image guided  random  liver biopsy for further evaluation.   Past Medical History:  Diagnosis Date  . Anemia   . Asthma    bronchitis  . Cancer [facility])     Past Surgical History:  Procedure Laterality Date  . BREAST LUMPECTOMY WITH NEEDLE LOCALIZATION Right 02/26/2013   Procedure: BREAST LUMPECTOMY WITH NEEDLE LOCALIZATION;  Surgeon: Marcello Moores A. Cornett, MD;  Location: [place];  Service: General;  Laterality: Right;  NEEDLE LOCALIZATION AT [place] 7;30   . CERVICAL BIOPSY    . CESAREAN SECTION  02,05  . CHOLECYSTECTOMY    . COLONOSCOPY     Dr Orlena Sheldon 2-14 around 2017  . DILATION AND CURETTAGE OF UTERUS  2009   uterine ablation  . ESOPHAGOGASTRODUODENOSCOPY     around 08  . GASTRIC BYPASS  08    Allergies: Penicillins and Sulfa antibiotics  Medications: Prior to Admission medications   Medication Sig Start Date End Date Taking? Authorizing Provider  MAGNESIUM PO Take 483 mg by mouth at bedtime.   Yes [provider]  MELATONIN GUMMIES PO Take by mouth at bedtime.   Yes [provider]  meloxicam (MOBIC) 15 MG tablet Take 1 tablet (15 mg total) by mouth daily. Patient taking differently: Take 15 mg by mouth as needed.  12/11/19  Yes Stover, Titorya, DPM  vitamin E 1000 UNIT capsule Take 1,000 Units by mouth daily.   Yes [provider]     Family History  Problem Relation Age of Onset  . Colon polyps Father   . Colon cancer Paternal Barbaraann Rondo        has a colestomy bag now. Around 60's  . Esophageal cancer Neg Hx      Social History   Socioeconomic History  . Marital status: Married    Spouse name: Not on file  . Number of children: Not on file  . Years of education: Not on file  . Highest education level: Not on file  Occupational History  . Not on file  Tobacco Use  . Smoking status: Former Smoker    Quit date: 02/11/2009    Years since quitting: 11.1  . Smokeless tobacco: Never Used  Vaping Use  . Vaping Use: Never used  Substance and Sexual Activity  . Alcohol use: Yes    Comment: rare  . Drug use: No  . Sexual activity: Not on file  Other Topics Concern  . Not on file  Social History Narrative  . Not on file   Social Determinants of Health   Financial Resource Strain:   . Difficulty of Paying Living Expenses: Not on file  Food Insecurity:   . Worried About Charity fundraiser in the Last Year: Not on file  . Ran Out of Food in the Last Year: Not on file  Transportation Needs:   . Lack of Transportation (Medical): Not on file  . Lack of Transportation (Non-Medical): Not on file  Physical Activity:   . Days of Exercise per Week: Not on file  . Minutes of Exercise per Session: Not on file  Stress:   . Feeling of Stress : Not on file  Social Connections:   . Frequency of Communication with Friends and Family: Not on file  . Frequency of Social Gatherings with Friends and Family: Not on file  . Attends Religious Services: Not on file  . Active Member of Clubs or Organizations: Not on file  . Attends Archivist Meetings: Not on file  . Marital Status: Not on file     Review of Systems denies fever,HA,CP, dyspnea, cough, abd/back pain,n/v or bleeding. Does have occ constipation  Vital Signs: BP (!) 151/85 (BP Location: Right Arm)   Pulse 72   Temp 98.3 F (36.8 C) (Oral)   Resp 18   SpO2 99%   Physical Exam awake/alert; chest- CTA bilat; heart- RRR; abd- soft,+BS,NT; ext - no sig edema  Imaging: MR LIVER W WO CONTRAST  Result Date: 04/02/2020 CLINICAL  DATA:  Abnormal liver function tests. EXAM: MRI ABDOMEN WITHOUT AND WITH CONTRAST TECHNIQUE: Multiplanar multisequence MR imaging of the abdomen was performed both before and after the administration of intravenous contrast. CONTRAST:.: CONTRAST:. 10mL GADAVIST GADOBUTROL 1 MMOL/ML IV SOLN COMPARISON:  CT abdomen 01/20/2020 FINDINGS: Lower chest: No acute findings. Hepatobiliary: No suspicious focal liver abnormalities identified. Previous cholecystectomy. Common bile duct is upper limits of normal in caliber status post cholecystectomy measuring 6 mm. No choledocholithiasis identified. Pancreas: 6 mm T2 hyperintense cystic structure associated with the neck of pancreas is identified, image 23/9. No abnormal enhancement associated with this structure. The remainder of the pancreas is unremarkable. No main duct dilatation, inflammation or enhancing mass. Spleen:  Within normal limits in size and appearance. Adrenals/Urinary Tract: Normal appearance of the adrenal glands. No hydronephrosis identified bilaterally. Multiple bilateral subcentimeter T2 hyperintense kidney lesions are noted. These are technically too small to reliably characterize. The largest arises from the anterior cortex of the inferior pole of the right kidney measuring 8 mm, image 74/6. This may be mildly complicated by a thin internal area of septation. No solid enhancing kidney lesion identified bilaterally Stomach/Bowel: Visualized portions within the abdomen are unremarkable. Vascular/Lymphatic: No pathologically enlarged lymph nodes identified. No abdominal aortic aneurysm demonstrated. Other:  No free fluid or fluid collections. Musculoskeletal: No suspicious bone lesions identified. IMPRESSION: 1. No acute findings within the abdomen. 2. Previous cholecystectomy. 3. Small 6 mm cystic structure associated with the neck of pancreas is identified. This is technically too small to further characterize. Follow-up imaging with repeat MRI in 12 months  without and with contrast material recommended. This recommendation follows ACR consensus guidelines: Management of Incidental Pancreatic Cysts: A White Paper of the ACR Incidental Findings Committee. J Am Coll Radiol 2094;70:962-836. 4. Bilateral subcentimeter T2 hyperintense kidney lesions are technically too small to reliably characterize. The largest is in the anterior cortex of the inferior pole of the right kidney measuring 8 mm. This may be mildly complex containing a thin internal area of septation. Attention on follow-up MRI is recommended. Electronically Signed   By: Kerby Moors M.D.   On: 04/02/2020 17:11    Labs:  CBC: Recent Labs    03/25/20 1030 04/09/20 1130  WBC 8.7 7.5  HGB 12.4 12.1  HCT 36.7 39.0  PLT 263.0 268    COAGS: Recent Labs    03/25/20 1030 04/09/20 1130  INR 0.9 0.9    BMP: Recent Labs    03/25/20 1030  NA 136  K 4.2  CL 100  CO2 26  GLUCOSE 81  BUN 18  CALCIUM 9.4  CREATININE 0.59    LIVER FUNCTION TESTS: Recent Labs    03/25/20 1030  BILITOT 0.6  AST 28  ALT 41*  ALKPHOS 106  PROT 8.1  ALBUMIN 4.3    TUMOR MARKERS: Recent Labs    03/25/20 1030  AFPTM 2.7    Assessment and Plan: 49 y.o. female with hx anemia, HTN, elevated LFT's , prior RYGB 2008,  prior cholecystectomy, AMA positive(? PBC), fatty liver. She presents today for image guided  random  liver biopsy for further evaluation.Risks and benefits of procedure was discussed with the patient  including, but not limited to bleeding, infection, damage to adjacent structures or low yield requiring additional tests.  All of the questions were answered and there is agreement to proceed.  Consent signed and in chart.     Thank you for this interesting consult.  I greatly enjoyed meeting LIANNI KANAAN and look forward to participating in their care.  A copy of this report was sent to the requesting provider on this date.  Electronically Signed: D. Rowe Robert,  PA-C 04/09/2020, 12:03 PM   I spent a total of 25 minutes    in face to face in clinical consultation, greater than 50% of which was counseling/coordinating care for image guided random core liver biopsy

## 2020-04-09 NOTE — Procedures (Signed)
Interventional Radiology Procedure Note  Procedure: [place] random liver core bx    Complications: None  Estimated Blood Loss:  min  Findings: 18 g core x 2    M. Daryll Brod, MD

## 2020-04-09 NOTE — Discharge Instructions (Signed)
Please call Interventional Radiology clinic [phone] with any questions or concerns.  You may remove your dressing and shower tomorrow.  Liver Biopsy, Care After These instructions give you information on caring for yourself after your procedure. Your doctor may also give you more specific instructions. Call your doctor if you have any problems or questions after your procedure. What can I expect after the procedure? After the procedure, it is common to have:  Pain and soreness where the biopsy was done.  Bruising around the area where the biopsy was done.  Sleepiness and be tired for a few days. Follow these instructions at home: Medicines  Take over-the-counter and prescription medicines only as told by your doctor.  If you were prescribed an antibiotic medicine, take it as told by your doctor. Do not stop taking the antibiotic even if you start to feel better.  Do not take medicines such as aspirin and ibuprofen. These medicines can thin your blood. Do not take these medicines unless your doctor tells you to take them.  If you are taking prescription pain medicine, take actions to prevent or treat constipation. Your doctor may recommend that you: ? Drink enough fluid to keep your pee (urine) clear or pale yellow. ? Take over-the-counter or prescription medicines. ? Eat foods that are high in fiber, such as fresh fruits and vegetables, whole grains, and beans. ? Limit foods that are high in fat and processed sugars, such as fried and sweet foods. Caring for your cut  Follow instructions from your doctor about how to take care of your cuts from surgery (incisions). Make sure you: ? Wash your hands with soap and water before you change your bandage (dressing). If you cannot use soap and water, use hand sanitizer. ? Change your bandage as told by your doctor. ? Leave stitches (sutures), skin glue, or skin tape (adhesive) strips in place. They may need to stay in place for 2 weeks  or longer. If tape strips get loose and curl up, you may trim the loose edges. Do not remove tape strips completely unless your doctor says it is okay.  Check your cuts every day for signs of infection. Check for: ? Redness, swelling, or more pain. ? Fluid or blood. ? Pus or a bad smell. ? Warmth.  Do not take baths, swim, or use a hot tub until your doctor says it is okay to do so. Activity   Rest at home for 1-2 days or as told by your doctor. ? Avoid sitting for a long time without moving. Get up to take short walks every 1-2 hours.  Return to your normal activities as told by your doctor. Ask what activities are safe for you.  Do not do these things in the first 24 hours: ? Drive. ? Use machinery. ? Take a bath or shower.  Do not lift more than 10 pounds (4.5 kg) or play contact sports for the first 2 weeks. General instructions   Do not drink alcohol in the first week after the procedure.  Have someone stay with you for at least 24 hours after the procedure.  Get your test results. Ask your doctor or the department that is doing the test: ? When will my results be ready? ? How will I get my results? ? What are my treatment options? ? What other tests do I need? ? What are my next steps?  Keep all follow-up visits as told by your doctor. This is important. Contact a doctor if:    A cut bleeds and leaves more than just a small spot of blood.  A cut is red, puffs up (swells), or hurts more than before.  Fluid or something else comes from a cut.  A cut smells bad.  You have a fever or chills. Get help right away if:  You have swelling, bloating, or pain in your belly (abdomen).  You get dizzy or faint.  You have a rash.  You feel sick to your stomach (nauseous) or throw up (vomit).  You have trouble breathing, feel short of breath, or feel faint.  Your chest hurts.  You have problems talking or seeing.  You have trouble with your balance or moving your  arms or legs. Summary  After the procedure, it is common to have pain, soreness, bruising, and tiredness.  Your doctor will tell you how to take care of yourself at home. Change your bandage, take your medicines, and limit your activities as told by your doctor.  Call your doctor if you have symptoms of infection. Get help right away if your belly swells, your cut bleeds a lot, or you have trouble talking or breathing. This information is not intended to replace advice given to you by your health care provider. Make sure you discuss any questions you have with your health care provider. Document Revised: 08/11/2017 Document Reviewed: 08/11/2017 Elsevier Patient Education  2020 Elsevier Inc.   Moderate Conscious Sedation, Adult, Care After These instructions provide you with information about caring for yourself after your procedure. Your health care provider may also give you more specific instructions. Your treatment has been planned according to current medical practices, but problems sometimes occur. Call your health care provider if you have any problems or questions after your procedure. What can I expect after the procedure? After your procedure, it is common:  To feel sleepy for several hours.  To feel clumsy and have poor balance for several hours.  To have poor judgment for several hours.  To vomit if you eat too soon. Follow these instructions at home: For at least 24 hours after the procedure:   Do not: ? Participate in activities where you could fall or become injured. ? Drive. ? Use heavy machinery. ? Drink alcohol. ? Take sleeping pills or medicines that cause drowsiness. ? Make important decisions or sign legal documents. ? Take care of children on your own.  Rest. Eating and drinking  Follow the diet recommended by your health care provider.  If you vomit: ? Drink water, juice, or soup when you can drink without vomiting. ? Make sure you have little or no  nausea before eating solid foods. General instructions  Have a responsible adult stay with you until you are awake and alert.  Take over-the-counter and prescription medicines only as told by your health care provider.  If you smoke, do not smoke without supervision.  Keep all follow-up visits as told by your health care provider. This is important. Contact a health care provider if:  You keep feeling nauseous or you keep vomiting.  You feel light-headed.  You develop a rash.  You have a fever. Get help right away if:  You have trouble breathing. This information is not intended to replace advice given to you by your health care provider. Make sure you discuss any questions you have with your health care provider. Document Revised: 07/14/2017 Document Reviewed: 11/21/2015 Elsevier Patient Education  2020 Elsevier Inc.  

## 2020-04-10 ENCOUNTER — Other Ambulatory Visit: Payer: Self-pay

## 2020-04-11 ENCOUNTER — Other Ambulatory Visit: Payer: Self-pay | Admitting: Sports Medicine

## 2020-04-12 NOTE — Telephone Encounter (Signed)
Please advise. Last refill was 12/11/19

## 2020-04-13 ENCOUNTER — Telehealth: Payer: Self-pay | Admitting: Gastroenterology

## 2020-04-13 NOTE — Telephone Encounter (Signed)
Spoke to patient to inform her that the biopsy results have not come back yet. She will be notified as soon as Dr Lyndel Safe reviews them. Patient voiced understanding.

## 2020-04-14 LAB — SURGICAL PATHOLOGY

## 2020-04-18 NOTE — Progress Notes (Signed)
Bx- c/w with PBC with fibrosis grade 1-2/4. Alk phos is normal. I have discussed regarding biopsy results with the patient in detail.  Plan: -Start UDCA (ursodecholic acid/URSO) 715AQ po bid (dosed at appox 88m/kg) -Repeat CBC, CMP in 3 months -She already had first dose of hepatitis A/B vaccine. -FU in 3 months.  Continue to reduce weight.  Please get blood work before follow-up appointment. -Also please send her liver biopsy results  RG

## 2020-04-21 ENCOUNTER — Other Ambulatory Visit: Payer: Self-pay | Admitting: Gastroenterology

## 2020-04-21 ENCOUNTER — Other Ambulatory Visit: Payer: Self-pay

## 2020-04-21 DIAGNOSIS — K7460 Unspecified cirrhosis of liver: Secondary | ICD-10-CM

## 2020-04-21 MED ORDER — URSODIOL 500 MG PO TABS: 500.0000 mg | ORAL_TABLET | Freq: Two times a day (BID) | ORAL | 6 refills | Status: DC

## 2020-05-08 ENCOUNTER — Ambulatory Visit (INDEPENDENT_AMBULATORY_CARE_PROVIDER_SITE_OTHER): Payer: 59 | Admitting: Gastroenterology

## 2020-05-08 DIAGNOSIS — Z23 Encounter for immunization: Secondary | ICD-10-CM

## 2020-05-22 ENCOUNTER — Other Ambulatory Visit: Payer: 59

## 2020-06-04 ENCOUNTER — Ambulatory Visit: Payer: 59

## 2020-06-04 ENCOUNTER — Ambulatory Visit
Admission: RE | Admit: 2020-06-04 | Discharge: 2020-06-04 | Disposition: A | Discharge status: Home / Self Care | Payer: 59 | Source: Ambulatory Visit | Attending: Obstetrics & Gynecology | Admitting: Obstetrics & Gynecology

## 2020-06-04 ENCOUNTER — Other Ambulatory Visit: Payer: Self-pay | Admitting: Obstetrics & Gynecology

## 2020-06-04 ENCOUNTER — Other Ambulatory Visit: Payer: Self-pay

## 2020-06-04 DIAGNOSIS — N6489 Other specified disorders of breast: Secondary | ICD-10-CM

## 2020-06-04 IMAGING — MG MM DIGITAL DIAGNOSTIC UNILAT*R* W/ TOMO W/ CAD
8 series · 8 of 24 positions shown · non-contrast
Comparison: Previous exam(s).

CLINICAL DATA: 49-year-old female presenting for short-term
follow-up of a probably benign asymmetry in the right breast.

EXAM:
DIGITAL DIAGNOSTIC UNILATERAL RIGHT MAMMOGRAM WITH TOMO AND CAD

[R MLO synth-2D (1 of 2)]
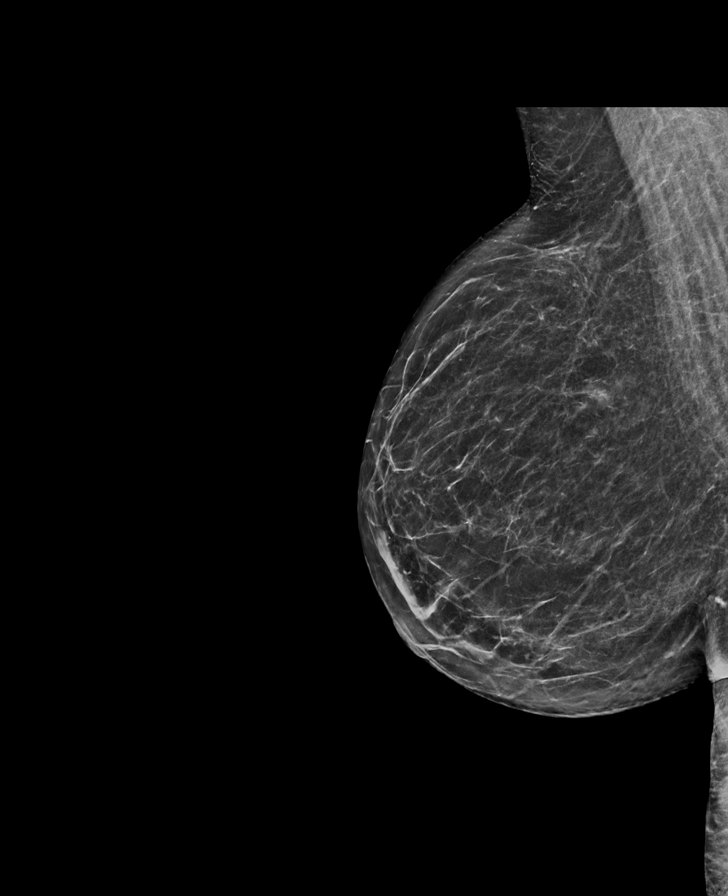

[R MLO synth-2D (2 of 2)]
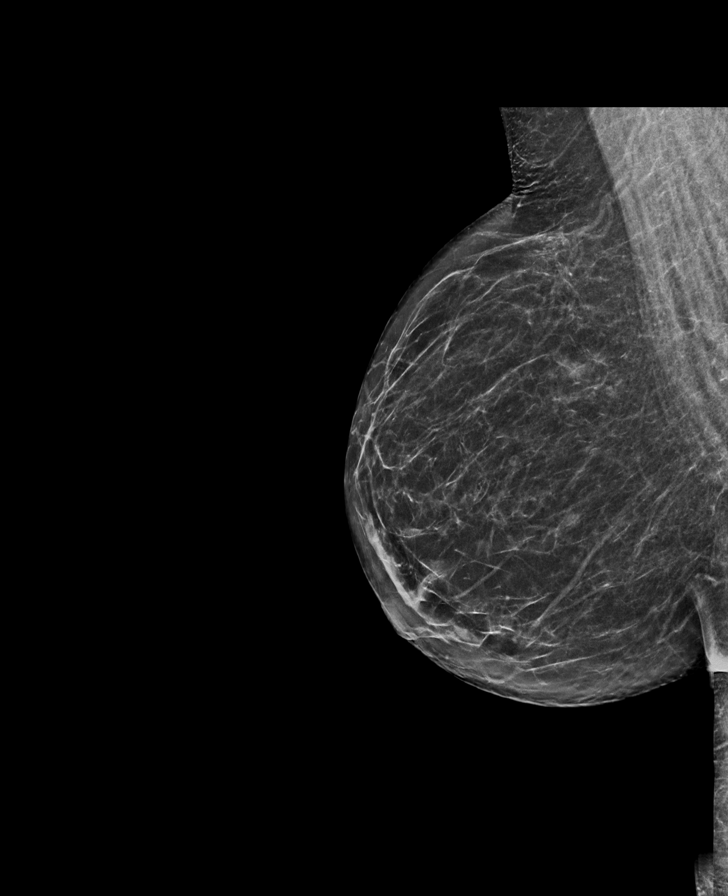

[R CC synth-2D (1 of 2)]
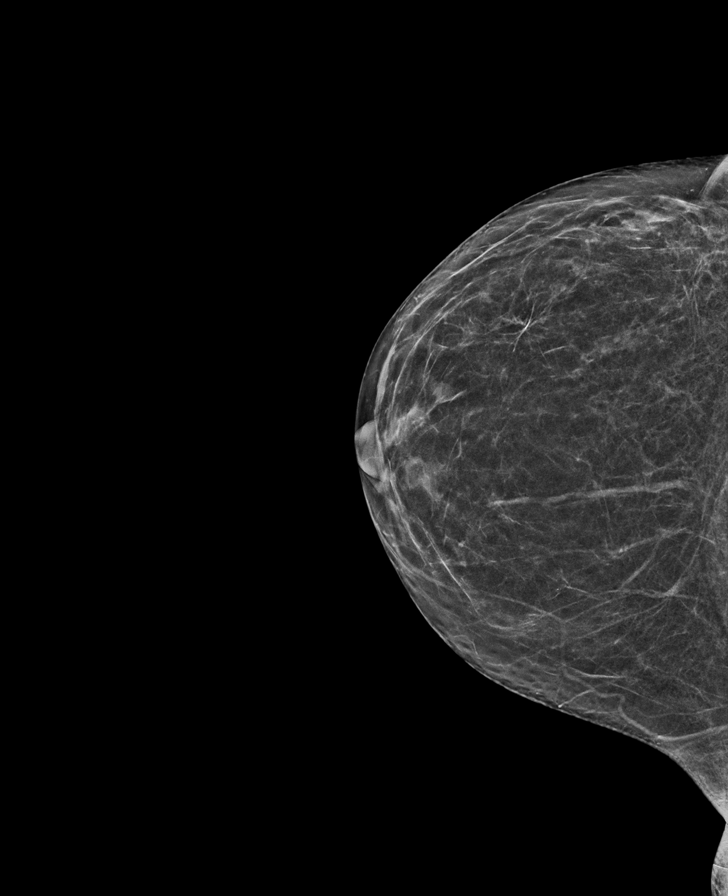

[R CC synth-2D (2 of 2)]
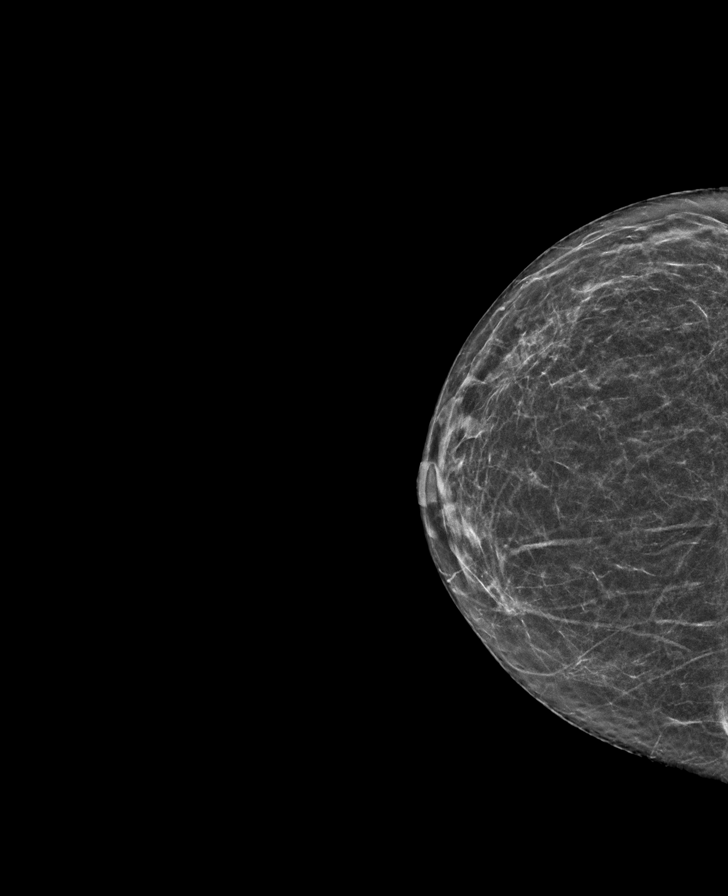

[R MLO tomo (1 of 2) · tomo slice 33/65.0]
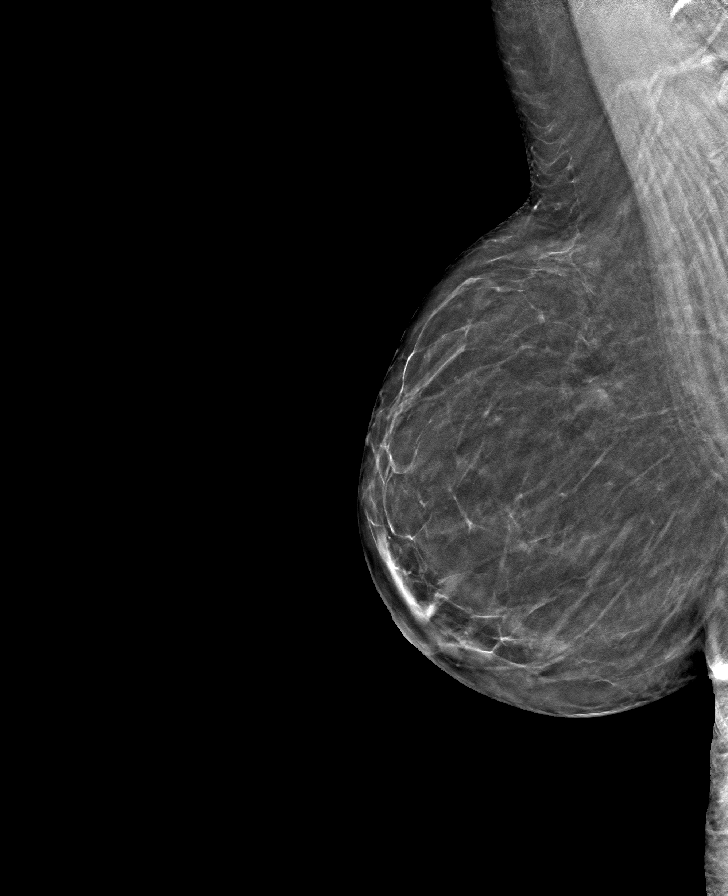

[R MLO tomo (2 of 2) · tomo slice 33/65.0]
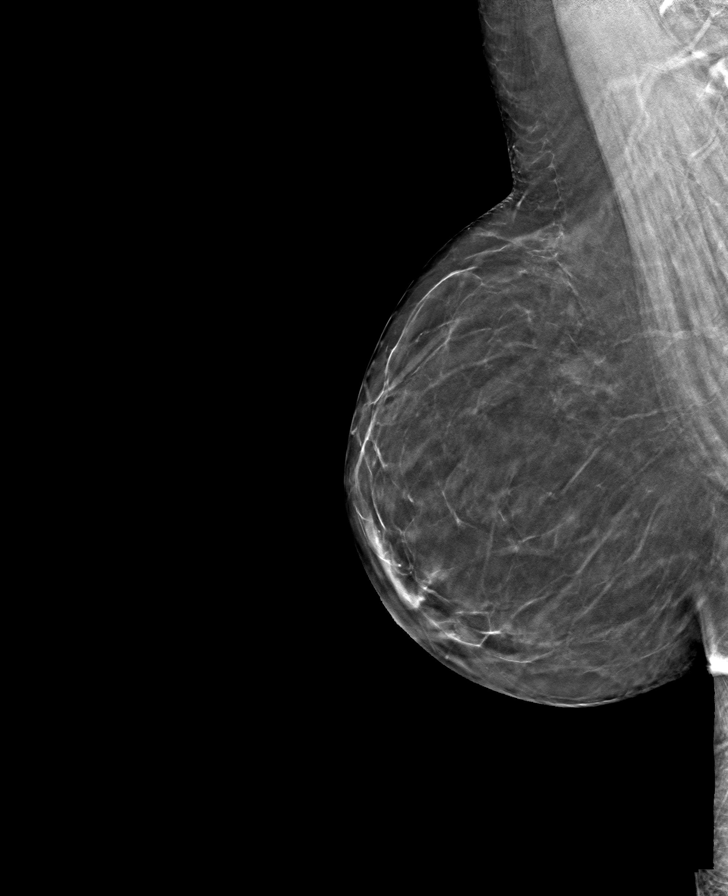

[R CC tomo (1 of 2) · tomo slice 27/53.0]
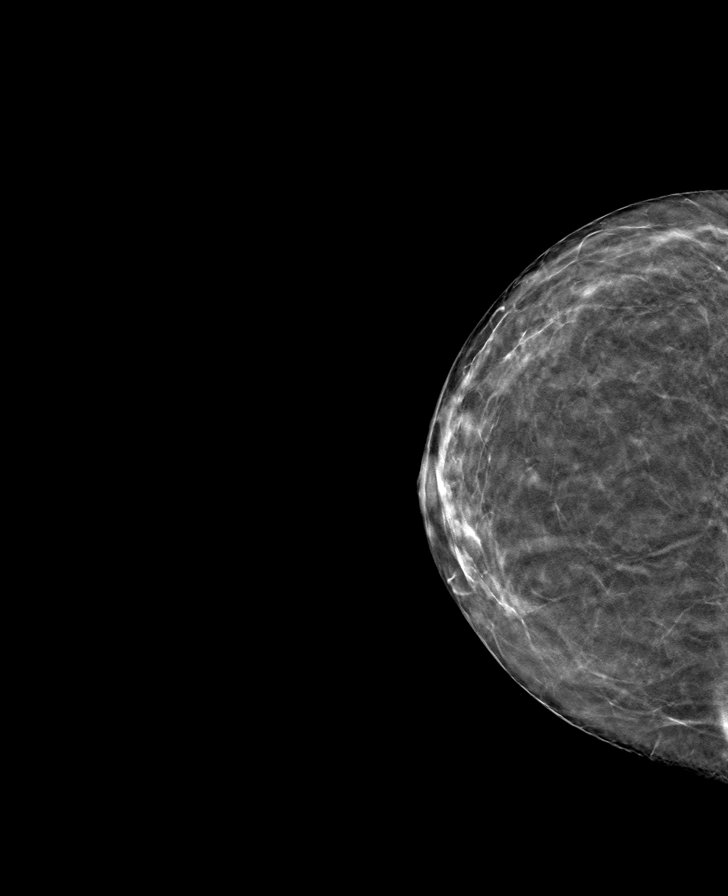

[R CC tomo (2 of 2) · tomo slice 28/55.0]
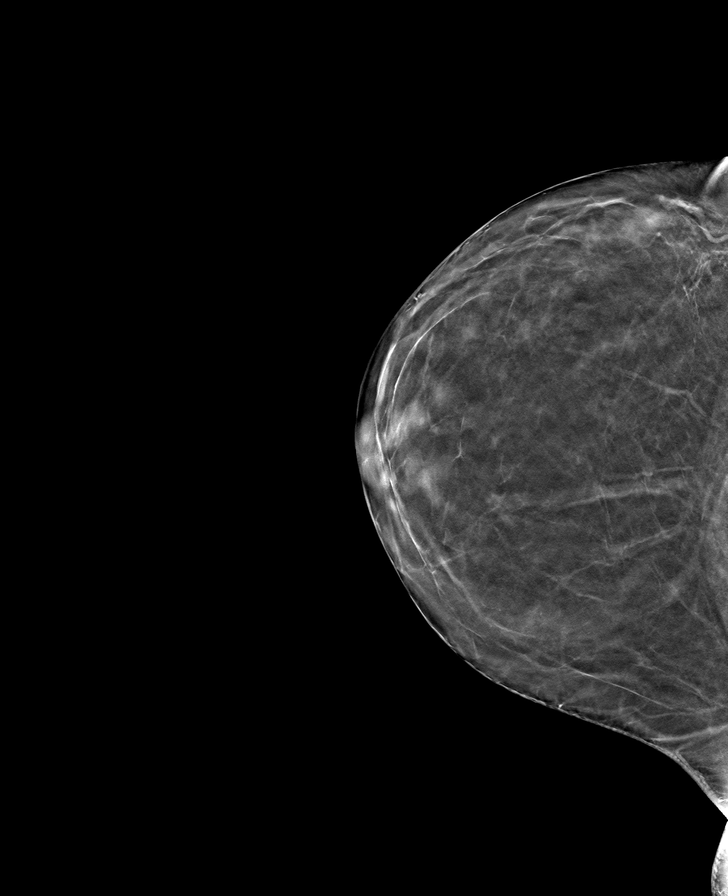

[8 of 24 positions shown; findings below may reference images not displayed]

ACR Breast Density Category b: There are scattered areas of
fibroglandular density.
FINDINGS: Full field tomosynthesis views of the right breast were performed.
There is persistence of a focal asymmetry in the outer right breast
which is not significantly changed compared to the prior mammogram.

No new suspicious mass, distortion, or microcalcifications are
identified to suggest presence of malignancy.

Mammographic images were processed with CAD.
IMPRESSION: Stable probably benign asymmetry in the outer right breast. No
sonographic correlate was identified on the prior exam.

RECOMMENDATION:
Diagnostic bilateral mammogram in 6 months.

I have discussed the findings and recommendations with the patient.
If applicable, a reminder letter will be sent to the patient
regarding the next appointment.

BI-RADS CATEGORY  3: Probably benign.

## 2020-06-22 ENCOUNTER — Other Ambulatory Visit (INDEPENDENT_AMBULATORY_CARE_PROVIDER_SITE_OTHER): Payer: 59

## 2020-06-22 DIAGNOSIS — K7460 Unspecified cirrhosis of liver: Secondary | ICD-10-CM

## 2020-06-22 LAB — COMPREHENSIVE METABOLIC PANEL
ALT: 24 U/L (ref 0–35)
AST: 22 U/L (ref 0–37)
Albumin: 3.8 g/dL (ref 3.5–5.2)
Alkaline Phosphatase: 96 U/L (ref 39–117)
BUN: 15 mg/dL (ref 6–23)
CO2: 27 mEq/L (ref 19–32)
Calcium: 8.5 mg/dL (ref 8.4–10.5)
Chloride: 103 mEq/L (ref 96–112)
Creatinine, Ser: 0.69 mg/dL (ref 0.40–1.20)
GFR: 101.86 mL/min (ref >60.00)
Glucose, Bld: 90 mg/dL (ref 70–99)
Potassium: 4 mEq/L (ref 3.5–5.1)
Sodium: 137 mEq/L (ref 135–145)
Total Bilirubin: 0.5 mg/dL (ref 0.2–1.2)
Total Protein: 6.8 g/dL (ref 6.0–8.3)

## 2020-06-22 LAB — CBC WITH DIFFERENTIAL/PLATELET
Basophils Absolute: 0 10*3/uL (ref 0.0–0.1)
Basophils Relative: 0.6 % (ref 0.0–3.0)
Eosinophils Absolute: 0.1 10*3/uL (ref 0.0–0.7)
Eosinophils Relative: 0.9 % (ref 0.0–5.0)
HCT: 33.7 % — ABNORMAL LOW (ref 36.0–46.0)
Hemoglobin: 11.3 g/dL — ABNORMAL LOW (ref 12.0–15.0)
Lymphocytes Relative: 17.7 % (ref 12.0–46.0)
Lymphs Abs: 1.3 10*3/uL (ref 0.7–4.0)
MCHC: 33.5 g/dL (ref 30.0–36.0)
MCV: 85.9 fl (ref 78.0–100.0)
Monocytes Absolute: 0.7 10*3/uL (ref 0.1–1.0)
Monocytes Relative: 9.5 % (ref 3.0–12.0)
Neutro Abs: 5.3 10*3/uL (ref 1.4–7.7)
Neutrophils Relative %: 71.3 % (ref 43.0–77.0)
Platelets: 254 10*3/uL (ref 150.0–400.0)
RBC: 3.92 Mil/uL (ref 3.87–5.11)
RDW: 14.4 % (ref 11.5–15.5)
WBC: 7.5 10*3/uL (ref 4.0–10.5)

## 2020-06-23 ENCOUNTER — Ambulatory Visit: Payer: 59 | Admitting: Gastroenterology

## 2020-06-23 ENCOUNTER — Encounter: Payer: Self-pay | Admitting: Gastroenterology

## 2020-06-23 VITALS — BP 130/80 | HR 72 | Ht 66.0 in | Wt 185.1 lb

## 2020-06-23 DIAGNOSIS — R945 Abnormal results of liver function studies: Secondary | ICD-10-CM

## 2020-06-23 DIAGNOSIS — R7989 Other specified abnormal findings of blood chemistry: Secondary | ICD-10-CM

## 2020-06-23 DIAGNOSIS — Z0279 Encounter for issue of other medical certificate: Secondary | ICD-10-CM

## 2020-06-23 NOTE — Progress Notes (Signed)
Chief Complaint: Abnormal LFTs  Referring Provider:  Nicoletta Dress, MD      ASSESSMENT AND PLAN;    1. PBC Dx on Liver Bx 03/2020, elevated AMA. Nl Alk Phos. has associated fatty liver. 2. H/O morbid obesity s/p RYGB 2008.  Patient at risk for liver cirrhosis.  Plan: -Continue Urso 500 mg p.o. twice daily.  -Continue exercising and reduce weight.  Exercise like walking 30 min/day, at least 3 x week.  -Change diet to more grilled foods rather than fried foods. Can try Mediterranean diet. -Avoid foods with high fructose corn syrup. -Iron studies - if we can add on to yesterday's blood work. If iron deficient, may need parenteral iron. -CBC, CMP, iron studies in 3 months. -RTC in 1 year. -D/W pt and pt's husband.    HPI:    Angela Miles is a 49 y.o. female  With H/O RYGB 2008 (with resultant 129lb wt loss), anxiety/depression, HTN, mild obesity  For follow-up visit. Had LFTs performed yesterday which showed normal AST/ALT as below. She is pleased with the progress. Her alk phos was normal as well. Hb was 11.3, slightly less than before with normal MCV. Platelet count was normal.  Patient was concerned about IDA. She did have iron infusion several years ago. We will try to add on iron studies.  Overall doing great. No GI complaints.  Liver biopsy revealed PBC with grade 1-2/4 fibrosis.  Has few nonspecific complaints like losing hair/eye twitching previously, being followed by Dr. Delena Bali. He is thinking about neurology work-up.  From previous note: referred to GI clinic for abn LFTs: AST 56, ALT 106, alk phos 132 with normal CBC hemoglobin 13.0, platelets 312.  Note that iron studies were normal. CT AP June 2021 showing significant heterogenous enhancement pattern throughout the liver which could be related to intrinsic liver disease.  No definite liver cirrhosis.  No H/O itching, skin lesions, intake of OTC meds including diet pills, herbal medications, anabolic  steroids or Tylenol. There is no H/O blood transfusions, IVDA or FH of liver disease. No jaundice, dark urine or pale stools. No alcohol abuse.  Does have history of easy bruisability.  Does have history of constipation. Neg colonoscopy by Dr. Melina Copa in 2017.  Repeat in 10 years.  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 Anemia   . Asthma    bronchitis  . Cancer [facility])     Past Surgical History:  Procedure Laterality Date  . BREAST EXCISIONAL BIOPSY Right    2014  . BREAST LUMPECTOMY WITH NEEDLE LOCALIZATION Right 02/26/2013   Procedure: BREAST LUMPECTOMY WITH NEEDLE LOCALIZATION;  Surgeon: Marcello Moores A. Cornett, MD;  Location: [place];  Service: General;  Laterality: Right;  NEEDLE LOCALIZATION AT [place] 7;30   . CERVICAL BIOPSY    . CESAREAN SECTION  02,05  . CHOLECYSTECTOMY    . COLONOSCOPY     Dr Orlena Sheldon 2-14 around 2017  . DILATION AND CURETTAGE OF UTERUS  2009   uterine ablation  . ESOPHAGOGASTRODUODENOSCOPY     around 08  . GASTRIC BYPASS  08    Family History  Problem Relation Age of Onset  . Colon polyps Father   . Colon cancer Paternal Uncle  has a colestomy bag now. Around 60's  . Esophageal cancer Neg Hx     Social History   Tobacco Use  . Smoking status: Former Smoker    Quit date: 02/11/2009    Years since quitting: 11.3  . Smokeless tobacco: Never Used  Vaping Use  . Vaping Use: Never used  Substance Use Topics  . Alcohol use: Yes    Comment: rare  . Drug use: No    Current Outpatient Medications  Medication Sig Dispense Refill  . MAGNESIUM PO Take 483 mg by mouth at bedtime.    Marland Kitchen MELATONIN GUMMIES PO Take by mouth at bedtime.    . meloxicam (MOBIC) 15 MG tablet TAKE 1 TABLET BY MOUTH ONCE DAILY. **NEEDS APPOINTMENT  FOR REFILLS** 30 tablet 0  . ursodiol (ACTIGALL) 250 MG tablet Take 500 mg by mouth 2 (two) times daily.     . vitamin E 1000 UNIT capsule Take 1,000 Units by mouth daily.     No current facility-administered medications for this visit.    Allergies  Allergen Reactions  . Penicillins Nausea Only  . Sulfa Antibiotics Palpitations    Review of Systems:  neg     Physical Exam:    BP 130/80   Pulse 72   Ht '5\' 6"'  (1.676 m)   Wt 185 lb 2 oz (84 kg)   BMI 29.88 kg/m  Wt Readings from Last 3 Encounters:  06/23/20 185 lb 2 oz (84 kg)  03/25/20 187 lb (84.8 kg)  03/15/13 191 lb (86.6 kg)   Constitutional:  Well-developed, in no acute distress. Psychiatric: Normal mood and affect. Behavior is normal. HEENT: Pupils normal.  Conjunctivae are normal. No scleral icterus. Neck supple.  Cardiovascular: Normal rate, regular rhythm. No edema Pulmonary/chest: Effort normal and breath sounds normal. No wheezing, rales or rhonchi. Abdominal: Soft, nondistended. Nontender. Bowel sounds active throughout. There are no masses palpable. Has hepatomegaly -liver palpated 5 cm below the costal margin. Rectal:  defered Neurological: Alert and oriented to person place and time. Skin: Skin is warm and dry. No rashes noted.  Data Reviewed: I have personally reviewed following labs and imaging studies  CBC: CBC Latest Ref Rng & Units 06/22/2020 04/09/2020 03/25/2020  WBC 4.0 - 10.5 K/uL 7.5 7.5 8.7  Hemoglobin 12.0 - 15.0 g/dL 11.3(L) 12.1 12.4  Hematocrit 36 - 46 % 33.7(L) 39.0 36.7  Platelets 150 - 400 K/uL 254.0 268 263.0    CMP: CMP Latest Ref Rng & Units 06/22/2020 03/25/2020 02/22/2013  Glucose 70 - 99 mg/dL 90 81 105(H)  BUN 6 - 23 mg/dL '15 18 9  ' Creatinine 0.40 - 1.20 mg/dL 0.69 0.59 0.48(L)  Sodium 135 - 145 mEq/L 137 136 137  Potassium 3.5 - 5.1 mEq/L 4.0 4.2 4.3  Chloride 96 - 112 mEq/L 103 100 104  CO2 19 - 32 mEq/L '27 26 26  ' Calcium 8.4 - 10.5 mg/dL 8.5 9.4 8.8  Total Protein 6.0 -  8.3 g/dL 6.8 8.1 6.7  Total Bilirubin 0.2 - 1.2 mg/dL 0.5 0.6 0.4  Alkaline Phos 39 - 117 U/L 96 106 75  AST 0 - 37 U/L '22 28 15  ' ALT 0 - 35 U/L 24 41(H) 16     Carmell Austria, MD 06/23/2020, 9:43 AM  Cc: Nicoletta Dress, MD

## 2020-06-23 NOTE — Patient Instructions (Signed)
If you are age 49 or older, your body mass index should be between 23-30. Your Body mass index is 29.88 kg/m. If this is out of the aforementioned range listed, please consider follow up with your Primary Care Provider.  If you are age 99 or younger, your body mass index should be between 19-25. Your Body mass index is 29.88 kg/m. If this is out of the aformentioned range listed, please consider follow up with your Primary Care Provider.    Please go to the lab at [facility] ([place] You will need to go to level "B", you do not need an appointment for this. Hours available are 7:30 am - 4:30 pm.  This will need to be done in 3 months  Thank you,  Dr. Jackquline Denmark

## 2020-06-25 ENCOUNTER — Telehealth: Payer: Self-pay | Admitting: Gastroenterology

## 2020-06-25 NOTE — Telephone Encounter (Signed)
Patient called states the fMLA forms were signed by Dr. Lyndel Safe but not dated therefore they have it as incomplete. Needs to be dated and initialed

## 2020-06-25 NOTE — Telephone Encounter (Signed)
Is this something you are working on? 

## 2020-06-30 NOTE — Telephone Encounter (Signed)
I have resent this

## 2020-07-13 ENCOUNTER — Ambulatory Visit: Payer: 59 | Admitting: Gastroenterology

## 2020-09-25 ENCOUNTER — Other Ambulatory Visit (INDEPENDENT_AMBULATORY_CARE_PROVIDER_SITE_OTHER): Payer: 59

## 2020-09-25 DIAGNOSIS — R7989 Other specified abnormal findings of blood chemistry: Secondary | ICD-10-CM

## 2020-09-25 DIAGNOSIS — R945 Abnormal results of liver function studies: Secondary | ICD-10-CM | POA: Diagnosis not present

## 2020-09-25 LAB — COMPREHENSIVE METABOLIC PANEL
ALT: 63 U/L — ABNORMAL HIGH (ref 0–35)
AST: 28 U/L (ref 0–37)
Albumin: 3.8 g/dL (ref 3.5–5.2)
Alkaline Phosphatase: 129 U/L — ABNORMAL HIGH (ref 39–117)
BUN: 16 mg/dL (ref 6–23)
CO2: 25 mEq/L (ref 19–32)
Calcium: 8.7 mg/dL (ref 8.4–10.5)
Chloride: 102 mEq/L (ref 96–112)
Creatinine, Ser: 0.66 mg/dL (ref 0.40–1.20)
GFR: 102.77 mL/min (ref >60.00)
Glucose, Bld: 202 mg/dL — ABNORMAL HIGH (ref 70–99)
Potassium: 3.6 mEq/L (ref 3.5–5.1)
Sodium: 137 mEq/L (ref 135–145)
Total Bilirubin: 0.5 mg/dL (ref 0.2–1.2)
Total Protein: 7.4 g/dL (ref 6.0–8.3)

## 2020-09-25 LAB — FERRITIN: Ferritin: 10.5 ng/mL (ref 10.0–291.0)

## 2020-09-26 LAB — IRON, TOTAL/TOTAL IRON BINDING CAP
%SAT: 13 % (calc) — ABNORMAL LOW (ref 16–45)
Iron: 63 ug/dL (ref 40–190)
TIBC: 489 mcg/dL (calc) — ABNORMAL HIGH (ref 250–450)

## 2020-10-01 NOTE — Progress Notes (Signed)
Please inform the patient. Iron studies including ferritin c/w iron deficiency anemia which could be due to gastric bypass. R/O other causes. CBC was ordered as per last note but I do not see the results.  Last hemoglobin 06/2020 was 11.3. Plan: -Proceed with EGD -She had neg colonoscopy by Dr. Melina Copa in 2017.  Lets get the results. -Hemoccult cards x3 -No nonsteroidals. -Lets check CBC with differential. -Once CBC is back, she may benefit from IV iron.  Can you please ask if she ever saw anybody in hematology? Send report to family physician

## 2020-10-02 ENCOUNTER — Ambulatory Visit (INDEPENDENT_AMBULATORY_CARE_PROVIDER_SITE_OTHER): Payer: 59 | Admitting: Gastroenterology

## 2020-10-02 ENCOUNTER — Other Ambulatory Visit (INDEPENDENT_AMBULATORY_CARE_PROVIDER_SITE_OTHER): Payer: 59

## 2020-10-02 ENCOUNTER — Ambulatory Visit: Payer: 59

## 2020-10-02 DIAGNOSIS — R7989 Other specified abnormal findings of blood chemistry: Secondary | ICD-10-CM

## 2020-10-02 DIAGNOSIS — R945 Abnormal results of liver function studies: Secondary | ICD-10-CM

## 2020-10-02 DIAGNOSIS — Z23 Encounter for immunization: Secondary | ICD-10-CM | POA: Diagnosis not present

## 2020-10-02 DIAGNOSIS — K7460 Unspecified cirrhosis of liver: Secondary | ICD-10-CM

## 2020-10-02 LAB — CBC WITH DIFFERENTIAL/PLATELET
Basophils Absolute: 0.1 10*3/uL (ref 0.0–0.1)
Basophils Relative: 0.8 % (ref 0.0–3.0)
Eosinophils Absolute: 0.1 10*3/uL (ref 0.0–0.7)
Eosinophils Relative: 0.6 % (ref 0.0–5.0)
HCT: 34.3 % — ABNORMAL LOW (ref 36.0–46.0)
Hemoglobin: 11.6 g/dL — ABNORMAL LOW (ref 12.0–15.0)
Lymphocytes Relative: 22.6 % (ref 12.0–46.0)
Lymphs Abs: 2.1 10*3/uL (ref 0.7–4.0)
MCHC: 33.9 g/dL (ref 30.0–36.0)
MCV: 85.5 fl (ref 78.0–100.0)
Monocytes Absolute: 0.8 10*3/uL (ref 0.1–1.0)
Monocytes Relative: 8.6 % (ref 3.0–12.0)
Neutro Abs: 6.2 10*3/uL (ref 1.4–7.7)
Neutrophils Relative %: 67.4 % (ref 43.0–77.0)
Platelets: 298 10*3/uL (ref 150.0–400.0)
RBC: 4.01 Mil/uL (ref 3.87–5.11)
RDW: 13.9 % (ref 11.5–15.5)
WBC: 9.2 10*3/uL (ref 4.0–10.5)

## 2020-10-12 ENCOUNTER — Other Ambulatory Visit: Payer: Self-pay | Admitting: Gastroenterology

## 2020-10-12 DIAGNOSIS — D649 Anemia, unspecified: Secondary | ICD-10-CM

## 2020-10-16 ENCOUNTER — Other Ambulatory Visit (INDEPENDENT_AMBULATORY_CARE_PROVIDER_SITE_OTHER): Payer: 59

## 2020-10-16 DIAGNOSIS — R7989 Other specified abnormal findings of blood chemistry: Secondary | ICD-10-CM

## 2020-10-16 DIAGNOSIS — K7460 Unspecified cirrhosis of liver: Secondary | ICD-10-CM

## 2020-10-16 DIAGNOSIS — R945 Abnormal results of liver function studies: Secondary | ICD-10-CM | POA: Diagnosis not present

## 2020-10-16 LAB — HEMOCCULT SLIDES (X 3 CARDS)
Fecal Occult Blood: NEGATIVE
OCCULT 1: NEGATIVE
OCCULT 2: NEGATIVE
OCCULT 3: NEGATIVE
OCCULT 4: NEGATIVE
OCCULT 5: NEGATIVE

## 2020-10-29 ENCOUNTER — Other Ambulatory Visit: Payer: Self-pay | Admitting: Oncology

## 2020-10-29 DIAGNOSIS — D508 Other iron deficiency anemias: Secondary | ICD-10-CM

## 2020-10-30 ENCOUNTER — Other Ambulatory Visit: Payer: Self-pay | Admitting: Hematology and Oncology

## 2020-10-30 ENCOUNTER — Other Ambulatory Visit: Payer: Self-pay

## 2020-10-30 ENCOUNTER — Inpatient Hospital Stay: Payer: 59 | Admitting: Oncology

## 2020-10-30 ENCOUNTER — Telehealth: Payer: Self-pay | Admitting: Oncology

## 2020-10-30 ENCOUNTER — Inpatient Hospital Stay: Payer: 59 | Attending: Oncology

## 2020-10-30 ENCOUNTER — Other Ambulatory Visit: Payer: Self-pay | Admitting: Oncology

## 2020-10-30 ENCOUNTER — Encounter: Payer: Self-pay | Admitting: Oncology

## 2020-10-30 DIAGNOSIS — D508 Other iron deficiency anemias: Secondary | ICD-10-CM | POA: Diagnosis not present

## 2020-10-30 DIAGNOSIS — Z800 Family history of malignant neoplasm of digestive organs: Secondary | ICD-10-CM | POA: Diagnosis not present

## 2020-10-30 DIAGNOSIS — R87619 Unspecified abnormal cytological findings in specimens from cervix uteri: Secondary | ICD-10-CM | POA: Insufficient documentation

## 2020-10-30 DIAGNOSIS — Z9884 Bariatric surgery status: Secondary | ICD-10-CM | POA: Insufficient documentation

## 2020-10-30 DIAGNOSIS — R233 Spontaneous ecchymoses: Secondary | ICD-10-CM | POA: Insufficient documentation

## 2020-10-30 DIAGNOSIS — K909 Intestinal malabsorption, unspecified: Secondary | ICD-10-CM | POA: Diagnosis present

## 2020-10-30 DIAGNOSIS — Z79899 Other long term (current) drug therapy: Secondary | ICD-10-CM | POA: Insufficient documentation

## 2020-10-30 DIAGNOSIS — Z8249 Family history of ischemic heart disease and other diseases of the circulatory system: Secondary | ICD-10-CM | POA: Insufficient documentation

## 2020-10-30 DIAGNOSIS — R238 Other skin changes: Secondary | ICD-10-CM | POA: Insufficient documentation

## 2020-10-30 DIAGNOSIS — D649 Anemia, unspecified: Secondary | ICD-10-CM | POA: Insufficient documentation

## 2020-10-30 LAB — IRON AND TIBC
Iron: 46 ug/dL (ref 28–170)
Saturation Ratios: 9 % — ABNORMAL LOW (ref 10.4–31.8)
TIBC: 531 ug/dL — ABNORMAL HIGH (ref 250–450)
UIBC: 485 ug/dL

## 2020-10-30 LAB — VITAMIN B12: Vitamin B-12: 665 pg/mL (ref 180–914)

## 2020-10-30 LAB — CBC AND DIFFERENTIAL
HCT: 34 — AB (ref 36–46)
Hemoglobin: 11.5 — AB (ref 12.0–16.0)
Neutrophils Absolute: 5.59
Platelets: 282 (ref 150–399)
WBC: 8.1

## 2020-10-30 LAB — CBC
MCV: 86 (ref 81–99)
RBC: 4 (ref 3.87–5.11)

## 2020-10-30 LAB — FOLATE: Folate: 6.3 ng/mL (ref >5.9)

## 2020-10-30 LAB — FERRITIN: Ferritin: 15 ng/mL (ref 11–307)

## 2020-10-30 NOTE — Telephone Encounter (Signed)
Per 3/18 LOS, patient scheduled for June Appt's.  Gave patient Appt Summary

## 2020-11-11 ENCOUNTER — Other Ambulatory Visit: Payer: Self-pay

## 2020-11-11 DIAGNOSIS — D509 Iron deficiency anemia, unspecified: Secondary | ICD-10-CM | POA: Insufficient documentation

## 2020-11-11 DIAGNOSIS — D508 Other iron deficiency anemias: Secondary | ICD-10-CM

## 2020-11-11 NOTE — Progress Notes (Signed)
[facility]  [address] [place],  [place]  [address] [phone]  Clinic Day:  10/30/2020  Referring physician: Jackquline Denmark, MD  HISTORY OF PRESENT ILLNESS:  The patient is a 50 y.o. female  who I was asked to consult upon for anemia.  Labs in February 2022 showed a mildly low hemoglobin of 11.6.  Although her MCV was normal at 85.5, her iron parameters were low, including a low ferritin of 10.5, a serum iron of 63, an elevated TIBC of 489 and a low iron saturation of 13%.  She denies having any overt forms of blood loss to explain her anemia.  However she did undergo previous gastric bypass surgery in 2008.  She also underwent a uterine ablation for heavy menses in 2009.  Recent stool studies were negative for blood loss.  Her last colonoscopy over 5 years ago was normal.  Of note, she has tried to take oral iron in the past, but it causes GI upset.  She takes a monthly B12 injection.  PAST MEDICAL HISTORY:   Past Medical History:  Diagnosis Date  . Anemia   . Asthma    bronchitis  . Cancer [facility])     PAST SURGICAL HISTORY:   Past Surgical History:  Procedure Laterality Date  . BREAST EXCISIONAL BIOPSY Right    2014  . BREAST LUMPECTOMY WITH NEEDLE LOCALIZATION Right 02/26/2013   Procedure: BREAST LUMPECTOMY WITH NEEDLE LOCALIZATION;  Surgeon: Marcello Moores A. Cornett, MD;  Location: [facility];  Service: General;  Laterality: Right;  NEEDLE LOCALIZATION AT [place] 7;30   . CERVICAL BIOPSY    . CESAREAN SECTION  02,05  . CHOLECYSTECTOMY    . COLONOSCOPY     Dr Orlena Sheldon 2-14 around 2017  . DILATION AND CURETTAGE OF UTERUS  2009   uterine ablation  . ESOPHAGOGASTRODUODENOSCOPY     around 08  . GASTRIC BYPASS  08    CURRENT MEDICATIONS:   Current Outpatient Medications  Medication Sig Dispense Refill  . estradiol (ESTRACE) 0.1 MG/GM vaginal cream estradiol 0.01% (0.1 mg/gram) vaginal cream  Insert 1 g twice a week  by vaginal route.    . fluconazole (DIFLUCAN) 150 MG tablet fluconazole 150 mg tablet    . HYDROcodone-acetaminophen (NORCO/VICODIN) 5-325 MG tablet hydrocodone 5 mg-acetaminophen 325 mg tablet    . MAGNESIUM PO Take 483 mg by mouth at bedtime.    Marland Kitchen MELATONIN GUMMIES PO Take by mouth at bedtime.    . meloxicam (MOBIC) 15 MG tablet TAKE 1 TABLET BY MOUTH ONCE DAILY. **NEEDS APPOINTMENT FOR REFILLS** 30 tablet 0  . tiZANidine (ZANAFLEX) 4 MG tablet tizanidine 4 mg tablet    . ursodiol (ACTIGALL) 250 MG tablet Take 500 mg by mouth 2 (two) times daily.     . vitamin E 1000 UNIT capsule Take 1,000 Units by mouth daily.     No current facility-administered medications for this visit.    ALLERGIES:   Allergies  Allergen Reactions  . Penicillins Nausea Only  . Sulfa Antibiotics Palpitations    FAMILY HISTORY:   Family History  Problem Relation Age of Onset  . Hypertension Mother   . Colon polyps Father   . Dementia Father   . Hypertension Father   . Colon cancer Paternal Barbaraann Rondo        has a colestomy bag now. Around 60's  . Esophageal cancer Neg Hx   Her maternal grandfather died from an unspecified cancer.  SOCIAL HISTORY:  The patient was born and raised in [place].  She lives in town with her husband of 8 years.  They have 2 children.  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  There is no history of alcoholism or tobacco abuse.    REVIEW OF SYSTEMS:  Review of Systems  Constitutional: Positive for fatigue. Negative for fever.  HENT:   Negative for hearing loss and sore throat.   Eyes: Negative for eye problems.       Suboptimal vision  Respiratory: Negative for chest tightness, cough and hemoptysis.   Cardiovascular: Negative for chest pain and palpitations.  Gastrointestinal: Negative for abdominal distention, abdominal pain, blood in stool, constipation, diarrhea, nausea and vomiting.  Endocrine: Negative for hot flashes.  Genitourinary:  Negative for difficulty urinating, dysuria, frequency, hematuria and nocturia.   Musculoskeletal: Positive for back pain. Negative for arthralgias, gait problem and myalgias.  Skin: Negative.  Negative for itching and rash.  Neurological: Negative.  Negative for dizziness, extremity weakness, gait problem, headaches, light-headedness and numbness.  Hematological: Bruises/bleeds easily.  Psychiatric/Behavioral: Negative for depression and suicidal ideas. The patient is nervous/anxious.      PHYSICAL EXAM:  Blood pressure (!) 184/94, pulse 75, temperature 98.5 F (36.9 C), resp. rate 14, height 5\' 6"  (1.676 m), weight 189 lb 3.2 oz (85.8 kg), SpO2 98 %. Wt Readings from Last 3 Encounters:  10/30/20 189 lb 3.2 oz (85.8 kg)  06/23/20 185 lb 2 oz (84 kg)  03/25/20 187 lb (84.8 kg)   Body mass index is 30.54 kg/m. Performance status (ECOG): 0 - Asymptomatic Physical Exam Constitutional:      Appearance: Normal appearance. She is not ill-appearing.  HENT:     Mouth/Throat:     Mouth: Mucous membranes are moist.     Pharynx: Oropharynx is clear. No oropharyngeal exudate or posterior oropharyngeal erythema.  Cardiovascular:     Rate and Rhythm: Normal rate and regular rhythm.     Heart sounds: No murmur heard. No friction rub. No gallop.   Pulmonary:     Effort: Pulmonary effort is normal. No respiratory distress.     Breath sounds: Normal breath sounds. No wheezing, rhonchi or rales.  Chest:  Breasts:     Right: No axillary adenopathy or supraclavicular adenopathy.     Left: No axillary adenopathy or supraclavicular adenopathy.    Abdominal:     General: Bowel sounds are normal. There is no distension.     Palpations: Abdomen is soft. There is no mass.     Tenderness: There is no abdominal tenderness.  Musculoskeletal:        General: No swelling.     Right lower leg: No edema.     Left lower leg: No edema.  Lymphadenopathy:     Cervical: No cervical adenopathy.     Upper  Body:     Right upper body: No supraclavicular or axillary adenopathy.     Left upper body: No supraclavicular or axillary adenopathy.     Lower Body: No right inguinal adenopathy. No left inguinal adenopathy.  Skin:    General: Skin is warm.     Coloration: Skin is not jaundiced.     Findings: No lesion or rash.  Neurological:     General: No focal deficit present.     Mental Status: She is alert and oriented to person, place, and time. Mental status is at baseline.     Cranial Nerves: Cranial nerves  are intact.  Psychiatric:        Mood and Affect: Mood normal.        Behavior: Behavior normal.        Thought Content: Thought content normal.   .phy  LABS:   CBC Latest Ref Rng & Units 10/30/2020 10/02/2020 06/22/2020  WBC - 8.1 9.2 7.5  Hemoglobin 12.0 - 16.0 11.5(A) 11.6(L) 11.3(L)  Hematocrit 36 - 46 34(A) 34.3(L) 33.7(L)  Platelets 150 - 399 282 298.0 254.0    Ref. Range 10/30/2020 14:43  Iron Latest Ref Range: 28 - 170 ug/dL 46  UIBC Latest Units: ug/dL 485  TIBC Latest Ref Range: 250 - 450 ug/dL 531 (H)  Saturation Ratios Latest Ref Range: 10.4 - 31.8 % 9 (L)  Ferritin Latest Ref Range: 11 - 307 ng/mL 15  Folate Latest Ref Range: >5.9 ng/mL 6.3  Vitamin B12 Latest Ref Range: 180 - 914 pg/mL 665    ASSESSMENT & PLAN:  A 49 y.o. female who I was asked to consult upon for what appears to be iron deficiency anemia.  More than likely, this is related to her previous gastric bypass surgery, which irreversibly affects normal iron absorption from the stomach.  I will arrange for her to receive IV Feraheme 1020 mg over these next few weeks to rapidly replenish her iron stores and normalize her hemoglobin.  I will see her back in 3 months to reassess her iron and hemoglobin levels to see how well she responded to her upcoming IV Feraheme.  The patient understands all the plans discussed today and is in agreement with them.  I do appreciate Jackquline Denmark, MD for his new consult.    Chala Gul Macarthur Critchley, MD

## 2020-11-11 NOTE — Progress Notes (Deleted)
[facility]  [address] [place],  [place]  [address] [phone]  Clinic Day:  11/11/2020  Referring physician: Jackquline Denmark, MD   HISTORY OF PRESENT ILLNESS:  The patient is a 50 y.o. female  *** who I was asked to consult upon for ***   PAST MEDICAL HISTORY:   Past Medical History:  Diagnosis Date  . Anemia   . Asthma    bronchitis  . Cancer [facility])     PAST SURGICAL HISTORY:   Past Surgical History:  Procedure Laterality Date  . BREAST EXCISIONAL BIOPSY Right    2014  . BREAST LUMPECTOMY WITH NEEDLE LOCALIZATION Right 02/26/2013   Procedure: BREAST LUMPECTOMY WITH NEEDLE LOCALIZATION;  Surgeon: Marcello Moores A. Cornett, MD;  Location: [facility];  Service: General;  Laterality: Right;  NEEDLE LOCALIZATION AT [place] 7;30   . CERVICAL BIOPSY    . CESAREAN SECTION  02,05  . CHOLECYSTECTOMY    . COLONOSCOPY     Dr Orlena Sheldon 2-14 around 2017  . DILATION AND CURETTAGE OF UTERUS  2009   uterine ablation  . ESOPHAGOGASTRODUODENOSCOPY     around 08  . GASTRIC BYPASS  08    CURRENT MEDICATIONS:   Current Outpatient Medications  Medication Sig Dispense Refill  . estradiol (ESTRACE) 0.1 MG/GM vaginal cream estradiol 0.01% (0.1 mg/gram) vaginal cream  Insert 1 g twice a week by vaginal route.    . fluconazole (DIFLUCAN) 150 MG tablet fluconazole 150 mg tablet    . HYDROcodone-acetaminophen (NORCO/VICODIN) 5-325 MG tablet hydrocodone 5 mg-acetaminophen 325 mg tablet    . MAGNESIUM PO Take 483 mg by mouth at bedtime.    Marland Kitchen MELATONIN GUMMIES PO Take by mouth at bedtime.    . meloxicam (MOBIC) 15 MG tablet TAKE 1 TABLET BY MOUTH ONCE DAILY. **NEEDS APPOINTMENT FOR REFILLS** 30 tablet 0  . tiZANidine (ZANAFLEX) 4 MG tablet tizanidine 4 mg tablet    . ursodiol (ACTIGALL) 250 MG tablet Take 500 mg by mouth 2 (two) times daily.     . vitamin E 1000 UNIT capsule Take 1,000 Units by mouth daily.     No current  facility-administered medications for this visit.    ALLERGIES:   Allergies  Allergen Reactions  . Penicillins Nausea Only  . Sulfa Antibiotics Palpitations    FAMILY HISTORY:   Family History  Problem Relation Age of Onset  . Hypertension Mother   . Colon polyps Father   . Dementia Father   . Hypertension Father   . Colon cancer Paternal Barbaraann Rondo        has a colestomy bag now. Around 60's  . Esophageal cancer Neg Hx     SOCIAL HISTORY:   reports that she quit smoking about 11 years ago. She has never used smokeless tobacco. She reports previous alcohol use. She reports that she does not use drugs.  REVIEW OF SYSTEMS:  Review of Systems - Oncology   PHYSICAL EXAM:  Blood pressure (!) 184/94, pulse 75, temperature 98.5 F (36.9 C), resp. rate 14, height 5\' 6"  (1.676 m), weight 189 lb 3.2 oz (85.8 kg), SpO2 98 %. Wt Readings from Last 3 Encounters:  10/30/20 189 lb 3.2 oz (85.8 kg)  06/23/20 185 lb 2 oz (84 kg)  03/25/20 187 lb (84.8 kg)   Body mass index is 30.54 kg/m. Performance status (ECOG): {CHL ONC [id number] Physical Exam Constitutional:      Appearance: Normal appearance. She is not ill-appearing.  HENT:     Mouth/Throat:     Mouth: Mucous membranes are moist.     Pharynx: Oropharynx is clear. No oropharyngeal exudate or posterior oropharyngeal erythema.  Cardiovascular:     Rate and Rhythm: Normal rate and regular rhythm.     Heart sounds: No murmur heard. No friction rub. No gallop.   Pulmonary:     Effort: Pulmonary effort is normal. No respiratory distress.     Breath sounds: Normal breath sounds. No wheezing, rhonchi or rales.  Chest:  Breasts:     Right: No axillary adenopathy or supraclavicular adenopathy.     Left: No axillary adenopathy or supraclavicular adenopathy.    Abdominal:     General: Bowel sounds are normal. There is no distension.     Palpations: Abdomen is soft. There is no mass.     Tenderness: There is no abdominal  tenderness.  Musculoskeletal:        General: No swelling.     Right lower leg: No edema.     Left lower leg: No edema.  Lymphadenopathy:     Cervical: No cervical adenopathy.     Upper Body:     Right upper body: No supraclavicular or axillary adenopathy.     Left upper body: No supraclavicular or axillary adenopathy.     Lower Body: No right inguinal adenopathy. No left inguinal adenopathy.  Skin:    General: Skin is warm.     Coloration: Skin is not jaundiced.     Findings: No lesion or rash.  Neurological:     General: No focal deficit present.     Mental Status: She is alert and oriented to person, place, and time. Mental status is at baseline.     Cranial Nerves: Cranial nerves are intact.  Psychiatric:        Mood and Affect: Mood normal.        Behavior: Behavior normal.        Thought Content: Thought content normal.    .phy  LABS:   CBC Latest Ref Rng & Units 10/30/2020 10/02/2020 06/22/2020  WBC - 8.1 9.2 7.5  Hemoglobin 12.0 - 16.0 11.5(A) 11.6(L) 11.3(L)  Hematocrit 36 - 46 34(A) 34.3(L) 33.7(L)  Platelets 150 - 399 282 298.0 254.0   CMP Latest Ref Rng & Units 09/25/2020 06/22/2020 03/25/2020  Glucose 70 - 99 mg/dL 202(H) 90 81  BUN 6 - 23 mg/dL 16 15 18   Creatinine 0.40 - 1.20 mg/dL 0.66 0.69 0.59  Sodium 135 - 145 mEq/L 137 137 136  Potassium 3.5 - 5.1 mEq/L 3.6 4.0 4.2  Chloride 96 - 112 mEq/L 102 103 100  CO2 19 - 32 mEq/L 25 27 26   Calcium 8.4 - 10.5 mg/dL 8.7 8.5 9.4  Total Protein 6.0 - 8.3 g/dL 7.4 6.8 8.1  Total Bilirubin 0.2 - 1.2 mg/dL 0.5 0.5 0.6  Alkaline Phos 39 - 117 U/L 129(H) 96 106  AST 0 - 37 U/L 28 22 28   ALT 0 - 35 U/L 63(H) 24 41(H)     No results found for: CEA1 / No results found for: CEA1 No results found for: PSA1 No results found for: CNO709 No results found for: CAN125  No results found for: TOTALPROTELP, ALBUMINELP, A1GS, A2GS, BETS, BETA2SER, GAMS, MSPIKE, SPEI Lab Results  Component Value Date   TIBC 531 (H) 10/30/2020    TIBC 489 (H) 09/25/2020   FERRITIN 15 10/30/2020   FERRITIN 10.5 09/25/2020   IRONPCTSAT 9 (L) 10/30/2020  IRONPCTSAT 13 (L) 09/25/2020   No results found for: LDH  No results found for: AFPTUMOR, TOTALPROTELP, ALBUMINELP, A1GS, A2GS, BETS, BETA2SER, GAMS, MSPIKE, SPEI, LDH, CEA1, PSA1, IGASERUM, IGGSERUM, IGMSERUM, THGAB, THYROGLB  Recent Review Flowsheet Data    Oncology Labs Latest Ref Rng & Units 09/25/2020 10/30/2020   FERRITIN 11 - 307 ng/mL 10.5 15   IRONPCTSAT 10.4 - 31.8 % 13(L) 9(L)      STUDIES:  No results found.   ASSESSMENT & PLAN:  A 50 y.o. female who I was asked to consult upon for *** .The patient understands all the plans discussed today and is in agreement with them.  I do appreciate Jackquline Denmark, MD for his new consult.   Hanish Laraia Macarthur Critchley, MD

## 2020-11-13 ENCOUNTER — Other Ambulatory Visit: Payer: Self-pay | Admitting: Oncology

## 2020-11-20 ENCOUNTER — Other Ambulatory Visit: Payer: Self-pay

## 2020-11-20 ENCOUNTER — Inpatient Hospital Stay: Payer: 59 | Attending: Oncology

## 2020-11-20 VITALS — BP 178/86 | HR 94 | Temp 98.2°F | Resp 16 | Ht 66.0 in | Wt 187.2 lb

## 2020-11-20 DIAGNOSIS — K909 Intestinal malabsorption, unspecified: Secondary | ICD-10-CM | POA: Insufficient documentation

## 2020-11-20 DIAGNOSIS — Z9884 Bariatric surgery status: Secondary | ICD-10-CM | POA: Insufficient documentation

## 2020-11-20 DIAGNOSIS — D508 Other iron deficiency anemias: Secondary | ICD-10-CM | POA: Diagnosis not present

## 2020-11-20 MED ORDER — IRON SUCROSE 20 MG/ML IV SOLN
200.0000 mg | Freq: Once | INTRAVENOUS | Status: AC
  Administered 2020-11-20: 200 mg via INTRAVENOUS
  Filled 2020-11-20: qty 200

## 2020-11-20 MED ORDER — SODIUM CHLORIDE 0.9 % IV SOLN
Freq: Once | INTRAVENOUS | Status: AC
  Filled 2020-11-20: qty 250

## 2020-11-20 NOTE — Patient Instructions (Signed)

## 2020-11-23 ENCOUNTER — Inpatient Hospital Stay: Payer: 59

## 2020-11-23 ENCOUNTER — Other Ambulatory Visit: Payer: Self-pay

## 2020-11-23 VITALS — BP 173/88 | HR 96 | Temp 97.9°F | Resp 18 | Ht 66.0 in | Wt 189.0 lb

## 2020-11-23 DIAGNOSIS — D508 Other iron deficiency anemias: Secondary | ICD-10-CM

## 2020-11-23 MED ORDER — SODIUM CHLORIDE 0.9 % IV SOLN
Freq: Once | INTRAVENOUS | Status: AC
  Filled 2020-11-23: qty 250

## 2020-11-23 MED ORDER — SODIUM CHLORIDE 0.9 % IV SOLN
200.0000 mg | Freq: Once | INTRAVENOUS | Status: AC
  Administered 2020-11-23: 200 mg via INTRAVENOUS
  Filled 2020-11-23: qty 200

## 2020-11-23 NOTE — Progress Notes (Signed)
Pt d/c stable at 1421

## 2020-11-23 NOTE — Patient Instructions (Signed)

## 2020-11-24 ENCOUNTER — Inpatient Hospital Stay: Payer: 59

## 2020-11-24 VITALS — BP 132/73 | HR 91 | Temp 98.2°F | Resp 18 | Ht 66.0 in | Wt 190.0 lb

## 2020-11-24 DIAGNOSIS — D508 Other iron deficiency anemias: Secondary | ICD-10-CM

## 2020-11-24 MED ORDER — SODIUM CHLORIDE 0.9 % IV SOLN
200.0000 mg | Freq: Once | INTRAVENOUS | Status: AC
  Administered 2020-11-24: 200 mg via INTRAVENOUS
  Filled 2020-11-24: qty 200

## 2020-11-24 MED ORDER — SODIUM CHLORIDE 0.9 % IV SOLN
Freq: Once | INTRAVENOUS | Status: AC
  Filled 2020-11-24: qty 250

## 2020-11-24 NOTE — Patient Instructions (Signed)

## 2020-11-25 ENCOUNTER — Inpatient Hospital Stay: Payer: 59

## 2020-11-25 ENCOUNTER — Other Ambulatory Visit: Payer: Self-pay

## 2020-11-25 VITALS — BP 186/83 | HR 76 | Temp 98.7°F | Resp 18 | Ht 66.0 in | Wt 190.2 lb

## 2020-11-25 DIAGNOSIS — D508 Other iron deficiency anemias: Secondary | ICD-10-CM

## 2020-11-25 MED ORDER — SODIUM CHLORIDE 0.9 % IV SOLN
200.0000 mg | Freq: Once | INTRAVENOUS | Status: AC
  Administered 2020-11-25: 200 mg via INTRAVENOUS
  Filled 2020-11-25: qty 200

## 2020-11-25 MED ORDER — SODIUM CHLORIDE 0.9 % IV SOLN
Freq: Once | INTRAVENOUS | Status: AC
  Filled 2020-11-25: qty 250

## 2020-11-25 NOTE — Patient Instructions (Signed)

## 2020-11-30 ENCOUNTER — Inpatient Hospital Stay: Payer: 59

## 2020-11-30 ENCOUNTER — Other Ambulatory Visit: Payer: Self-pay

## 2020-11-30 DIAGNOSIS — D508 Other iron deficiency anemias: Secondary | ICD-10-CM | POA: Diagnosis not present

## 2020-11-30 MED ORDER — SODIUM CHLORIDE 0.9 % IV SOLN
Freq: Once | INTRAVENOUS | Status: AC
  Filled 2020-11-30: qty 250

## 2020-11-30 MED ORDER — SODIUM CHLORIDE 0.9 % IV SOLN
200.0000 mg | Freq: Once | INTRAVENOUS | Status: AC
  Administered 2020-11-30: 200 mg via INTRAVENOUS
  Filled 2020-11-30: qty 200

## 2020-12-03 ENCOUNTER — Ambulatory Visit
Admission: RE | Admit: 2020-12-03 | Discharge: 2020-12-03 | Disposition: A | Discharge status: Home / Self Care | Payer: 59 | Source: Ambulatory Visit | Attending: Obstetrics & Gynecology | Admitting: Obstetrics & Gynecology

## 2020-12-03 ENCOUNTER — Other Ambulatory Visit: Payer: Self-pay

## 2020-12-03 DIAGNOSIS — N6489 Other specified disorders of breast: Secondary | ICD-10-CM

## 2020-12-03 IMAGING — MG DIGITAL DIAGNOSTIC BILAT W/ TOMO W/ CAD
8 series · 8 of 24 positions shown · non-contrast
Comparison: Previous exam(s).

CLINICAL DATA: One year follow-up of a right breast asymmetry.

EXAM:
DIGITAL DIAGNOSTIC BILATERAL MAMMOGRAM WITH TOMOSYNTHESIS AND CAD
TECHNIQUE: Bilateral digital diagnostic mammography and breast tomosynthesis
was performed. The images were evaluated with computer-aided
detection.

[R CC synth-2D]
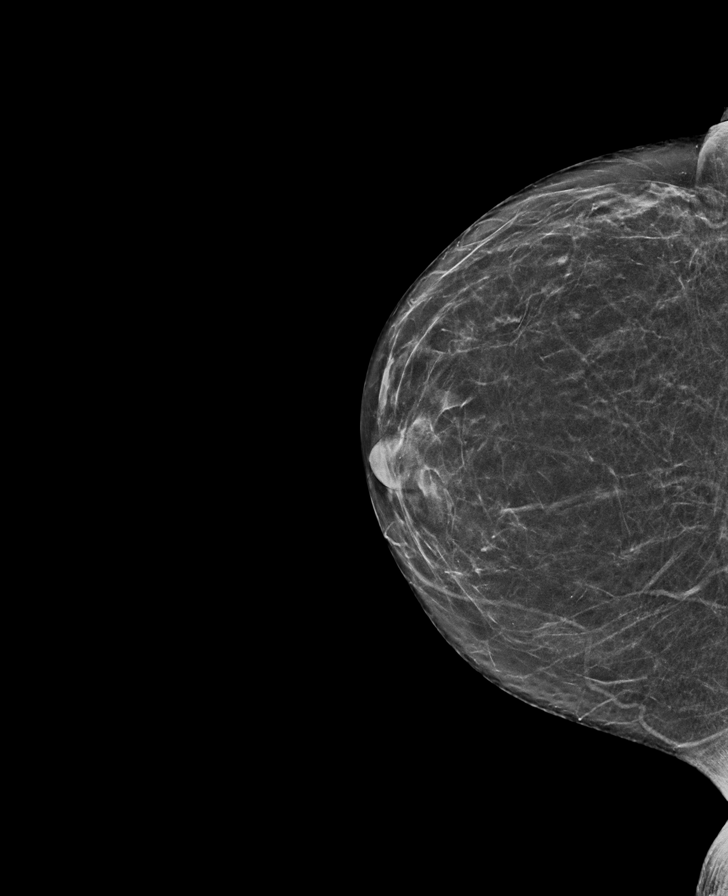

[L MLO synth-2D]
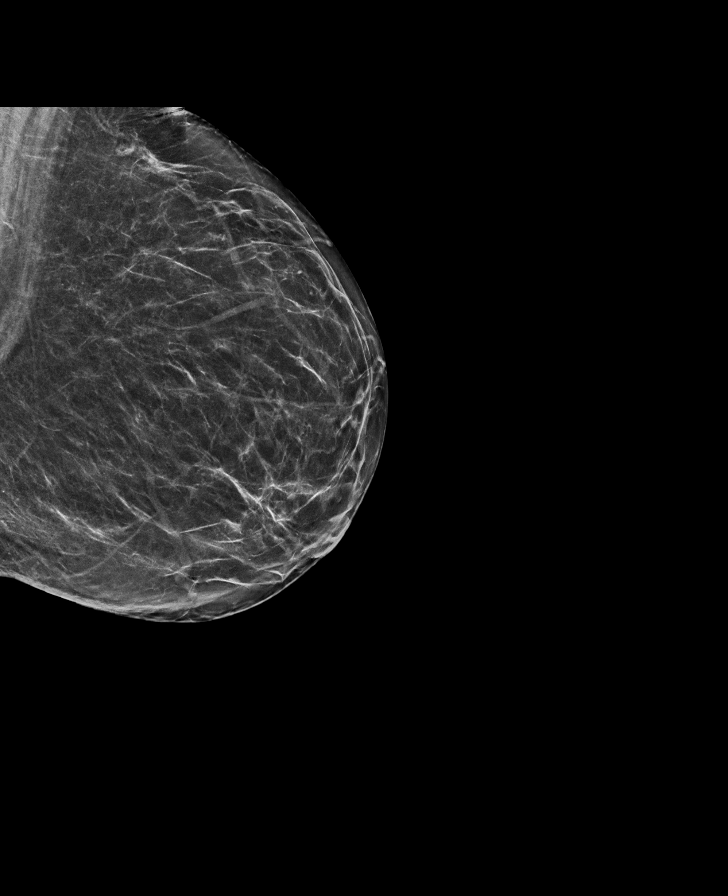

[R MLO synth-2D]
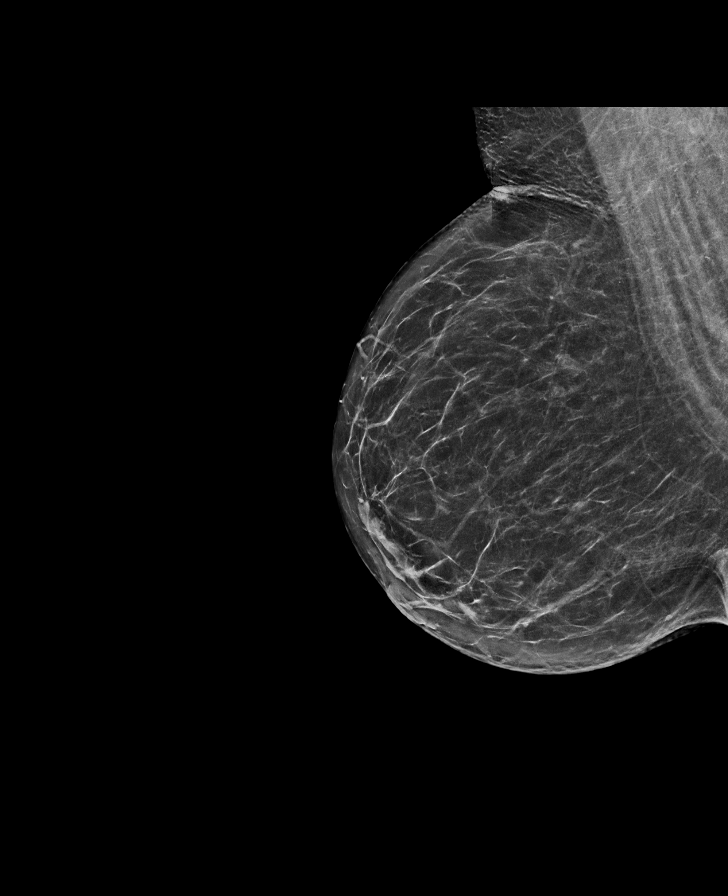

[L CC synth-2D]
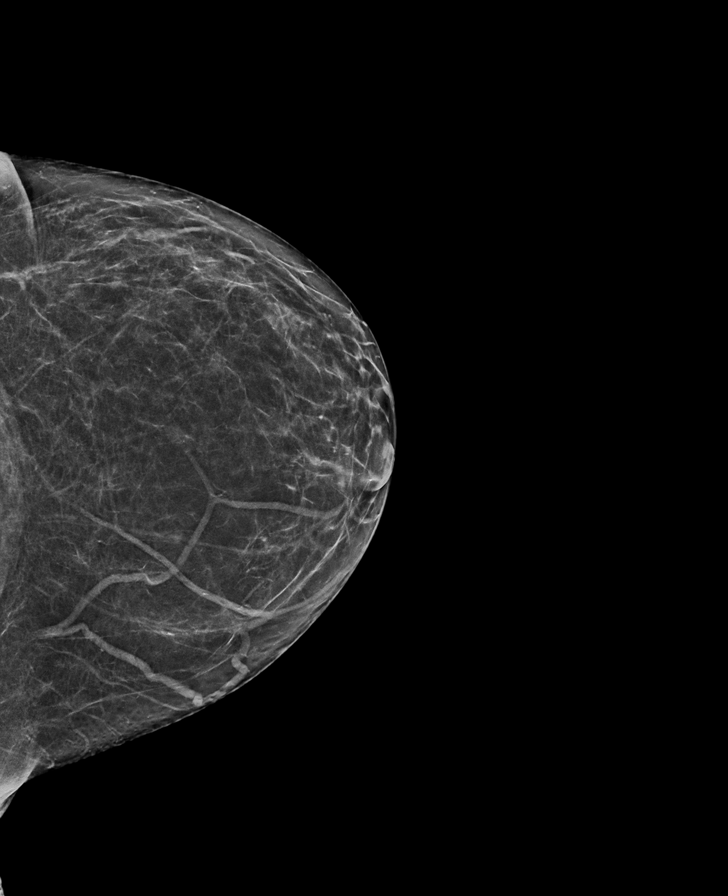

[R CC tomo · tomo slice 31/60.0]
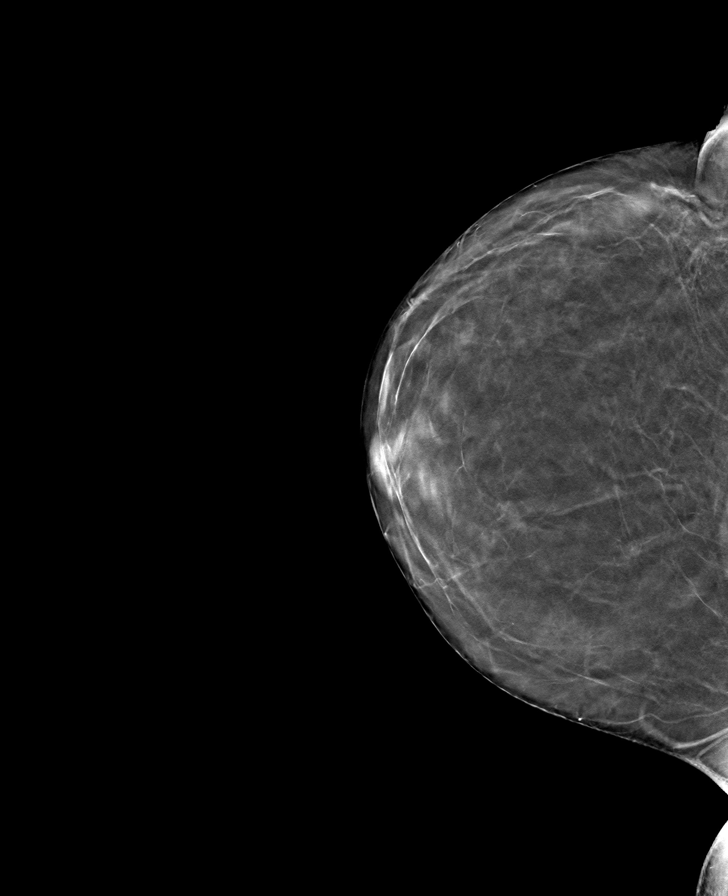

[L CC tomo · tomo slice 31/60.0]
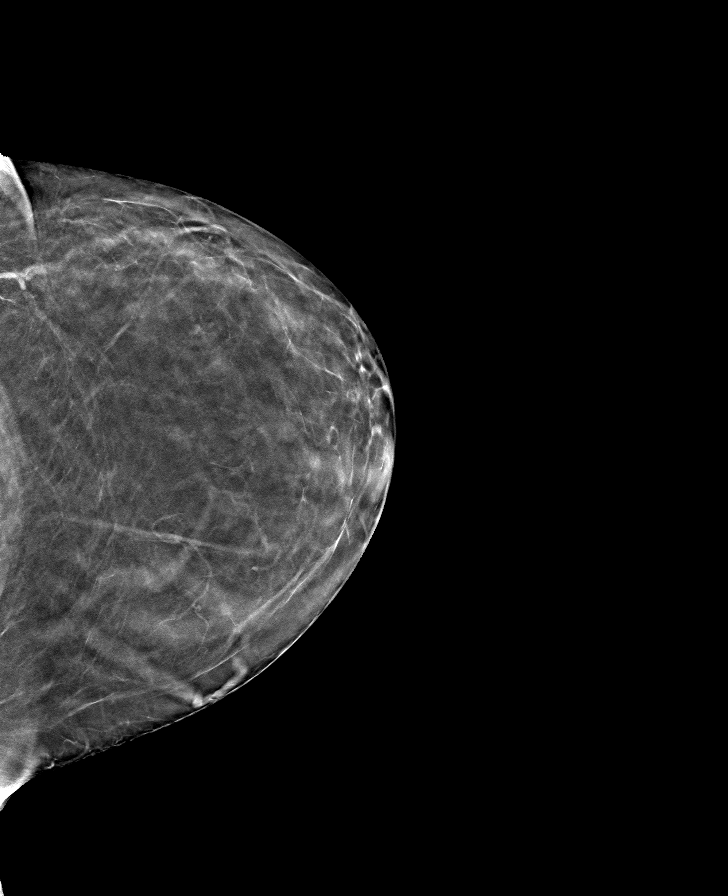

[R MLO tomo · tomo slice 37/74.0]
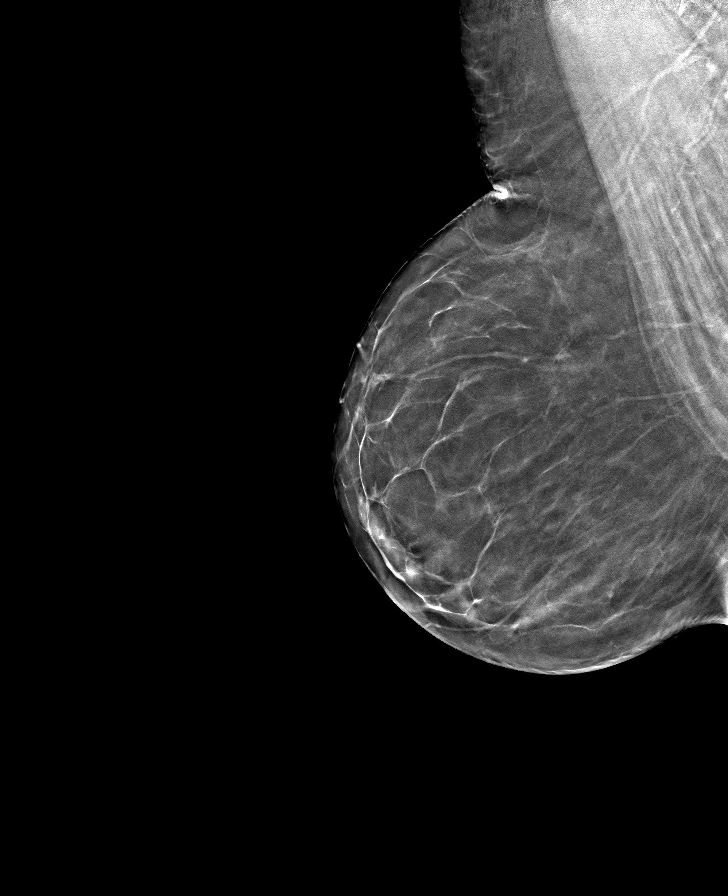

[L MLO tomo · tomo slice 35/68.0]
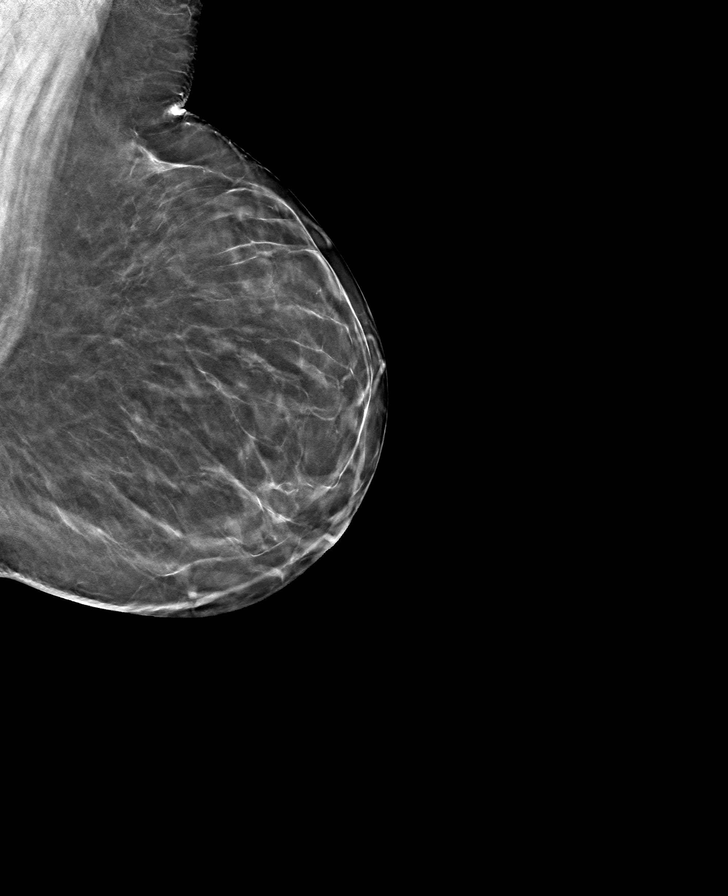

[8 of 24 positions shown; findings below may reference images not displayed]

ACR Breast Density Category b: There are scattered areas of
fibroglandular density.
FINDINGS: The asymmetry in the lateral central right breast at posterior depth
is unchanged mammographically. No other suspicious findings are seen
in either breast.
IMPRESSION: Stable probably benign right breast asymmetry located laterally and
centrally. No other abnormalities.

RECOMMENDATION:
Recommend 12 month follow-up mammography of the probably benign
right breast asymmetry. The patient will be due for bilateral
mammography at that time.

I have discussed the findings and recommendations with the patient.
If applicable, a reminder letter will be sent to the patient
regarding the next appointment.

BI-RADS CATEGORY  3: Probably benign.

## 2021-01-26 NOTE — Progress Notes (Signed)
[facility]  [address] [place],  [place]  [address] [phone]  Clinic Day:  01/29/2021  Referring physician: Nicoletta Dress, MD  This document serves as a record of services personally performed by Dequincy Macarthur Critchley, MD. It was created on their behalf by [facility], a trained medical scribe. The creation of this record is based on the scribe's personal observations and the provider's statements to them.  HISTORY OF PRESENT ILLNESS:  The patient is a 50 y.o. female with iron deficiency anemia secondary to her previous gastric bypass surgery.  She comes in today to reassess her iron and hemoglobin levels after receiving IV Feraheme.  Since getting her IV iron, the patient has felt better.  She continues to deny having any overt forms of blood loss since her last visit.      PHYSICAL EXAM:  Blood pressure (!) 182/84, pulse 68, temperature 98 F (36.7 C), resp. rate 16, height 5\' 6"  (1.676 m), weight 185 lb 8 oz (84.1 kg), SpO2 98 %. Wt Readings from Last 3 Encounters:  01/29/21 185 lb 8 oz (84.1 kg)  11/25/20 190 lb 4 oz (86.3 kg)  11/24/20 190 lb 0.6 oz (86.2 kg)   Body mass index is 29.94 kg/m. Performance status (ECOG): 0 - Asymptomatic Physical Exam Constitutional:      Appearance: Normal appearance. She is not ill-appearing.  HENT:     Mouth/Throat:     Mouth: Mucous membranes are moist.     Pharynx: Oropharynx is clear. No oropharyngeal exudate or posterior oropharyngeal erythema.  Cardiovascular:     Rate and Rhythm: Normal rate and regular rhythm.     Heart sounds: No murmur heard. No friction rub. No gallop.   Pulmonary:     Effort: Pulmonary effort is normal. No respiratory distress.     Breath sounds: Normal breath sounds. No wheezing, rhonchi or rales.  Chest:  Breasts:     Right: No axillary adenopathy or supraclavicular adenopathy.     Left: No axillary adenopathy or supraclavicular adenopathy.      Abdominal:      General: Bowel sounds are normal. There is no distension.     Palpations: Abdomen is soft. There is no mass.     Tenderness: There is no abdominal tenderness.  Musculoskeletal:        General: No swelling.     Right lower leg: No edema.     Left lower leg: No edema.  Lymphadenopathy:     Cervical: No cervical adenopathy.     Upper Body:     Right upper body: No supraclavicular or axillary adenopathy.     Left upper body: No supraclavicular or axillary adenopathy.     Lower Body: No right inguinal adenopathy. No left inguinal adenopathy.  Skin:    General: Skin is warm.     Coloration: Skin is not jaundiced.     Findings: No lesion or rash.  Neurological:     General: No focal deficit present.     Mental Status: She is alert and oriented to person, place, and time. Mental status is at baseline.     Cranial Nerves: Cranial nerves are intact.  Psychiatric:        Mood and Affect: Mood normal.        Behavior: Behavior normal.        Thought Content: Thought content normal.     LABS:   CBC Latest Ref Rng & Units 01/29/2021 10/30/2020 10/02/2020  WBC - 7.2 8.1 9.2  Hemoglobin 12.0 - 16.0 12.2 11.5(A) 11.6(L)  Hematocrit 36 - 46 34(A) 34(A) 34.3(L)  Platelets 150 - 399 242 282 298.0    Ref. Range 10/30/2020 14:43 12/03/2020 15:16 01/29/2021 00:00 01/29/2021 16:09  Iron Latest Ref Range: 28 - 170 ug/dL 46   52  UIBC Latest Units: ug/dL 485   387  TIBC Latest Ref Range: 250 - 450 ug/dL 531 (H)   439  Saturation Ratios Latest Ref Range: 10.4 - 31.8 % 9 (L)   12  Ferritin Latest Ref Range: 11 - 307 ng/mL 15   197    ASSESSMENT & PLAN:  A 50 y.o. female with iron deficiency anemia secondary to her previous gastric bypass surgery.  I am pleased as her hemoglobin and iron parameters have improved.  Clinically, she is doing well.  I will see her back in 4 months to reassess her iron and hemoglobin levess.  The patient understands all the plans discussed today and is in agreement with  them.   I, Rita Ohara, am acting as scribe for Marice Potter, MD    I have reviewed this report as typed by the medical scribe, and it is complete and accurate.  Dequincy Macarthur Critchley, MD

## 2021-01-29 ENCOUNTER — Other Ambulatory Visit: Payer: Self-pay

## 2021-01-29 ENCOUNTER — Inpatient Hospital Stay: Payer: 59 | Attending: Oncology

## 2021-01-29 ENCOUNTER — Encounter: Payer: Self-pay | Admitting: Oncology

## 2021-01-29 ENCOUNTER — Telehealth: Payer: Self-pay | Admitting: Oncology

## 2021-01-29 ENCOUNTER — Inpatient Hospital Stay (INDEPENDENT_AMBULATORY_CARE_PROVIDER_SITE_OTHER): Payer: 59 | Admitting: Oncology

## 2021-01-29 VITALS — BP 182/84 | HR 68 | Temp 98.0°F | Resp 16 | Ht 66.0 in | Wt 185.5 lb

## 2021-01-29 DIAGNOSIS — D508 Other iron deficiency anemias: Secondary | ICD-10-CM

## 2021-01-29 DIAGNOSIS — K909 Intestinal malabsorption, unspecified: Secondary | ICD-10-CM | POA: Insufficient documentation

## 2021-01-29 DIAGNOSIS — Z9884 Bariatric surgery status: Secondary | ICD-10-CM | POA: Insufficient documentation

## 2021-01-29 LAB — IRON AND TIBC
Iron: 52 ug/dL (ref 28–170)
Saturation Ratios: 12 % (ref 10.4–31.8)
TIBC: 439 ug/dL (ref 250–450)
UIBC: 387 ug/dL

## 2021-01-29 LAB — CBC AND DIFFERENTIAL
HCT: 34 — AB (ref 36–46)
Hemoglobin: 12.2 (ref 12.0–16.0)
Neutrophils Absolute: 4.39
Platelets: 242 (ref 150–399)
WBC: 7.2

## 2021-01-29 LAB — FERRITIN: Ferritin: 197 ng/mL (ref 11–307)

## 2021-01-29 LAB — CBC: RBC: 3.96 (ref 3.87–5.11)

## 2021-01-29 NOTE — Telephone Encounter (Signed)
Per 6/17 los next appt scheduled and given to patient

## 2021-02-03 ENCOUNTER — Other Ambulatory Visit: Payer: Self-pay | Admitting: Gastroenterology

## 2021-02-07 ENCOUNTER — Encounter: Payer: Self-pay | Admitting: Oncology

## 2021-03-09 ENCOUNTER — Other Ambulatory Visit: Payer: 59

## 2021-03-09 ENCOUNTER — Other Ambulatory Visit: Payer: Self-pay

## 2021-03-09 ENCOUNTER — Other Ambulatory Visit: Payer: Self-pay | Admitting: Hematology and Oncology

## 2021-03-09 ENCOUNTER — Telehealth: Payer: Self-pay

## 2021-03-09 DIAGNOSIS — D508 Other iron deficiency anemias: Secondary | ICD-10-CM

## 2021-03-09 NOTE — Telephone Encounter (Signed)
Called patient at the request to make a lab apt. Lab apt was made for the following day at 0830. Patient agreed to same.

## 2021-03-10 ENCOUNTER — Other Ambulatory Visit: Payer: Self-pay | Admitting: Hematology and Oncology

## 2021-03-10 ENCOUNTER — Encounter: Payer: Self-pay | Admitting: Hematology and Oncology

## 2021-03-10 ENCOUNTER — Inpatient Hospital Stay: Payer: 59 | Attending: Oncology

## 2021-03-10 ENCOUNTER — Other Ambulatory Visit: Payer: Self-pay

## 2021-03-10 DIAGNOSIS — Z79899 Other long term (current) drug therapy: Secondary | ICD-10-CM | POA: Insufficient documentation

## 2021-03-10 DIAGNOSIS — D508 Other iron deficiency anemias: Secondary | ICD-10-CM

## 2021-03-10 DIAGNOSIS — K909 Intestinal malabsorption, unspecified: Secondary | ICD-10-CM | POA: Insufficient documentation

## 2021-03-10 DIAGNOSIS — Z9884 Bariatric surgery status: Secondary | ICD-10-CM | POA: Insufficient documentation

## 2021-03-10 LAB — HEPATIC FUNCTION PANEL
ALT: 40 — AB (ref 7–35)
AST: 26 (ref 13–35)
Alkaline Phosphatase: 89 (ref 25–125)
Bilirubin, Total: 0.7

## 2021-03-10 LAB — CBC AND DIFFERENTIAL
HCT: 35 — AB (ref 36–46)
Hemoglobin: 11.9 — AB (ref 12.0–16.0)
Neutrophils Absolute: 4.9
Platelets: 248 (ref 150–399)
WBC: 7

## 2021-03-10 LAB — BASIC METABOLIC PANEL
BUN: 13 (ref 4–21)
CO2: 27 — AB (ref 13–22)
Chloride: 102 (ref 99–108)
Creatinine: 0.5 (ref 0.5–1.1)
Glucose: 92
Potassium: 3.7 (ref 3.4–5.3)
Sodium: 137 (ref 137–147)

## 2021-03-10 LAB — COMPREHENSIVE METABOLIC PANEL
Albumin: 3.9 (ref 3.5–5.0)
Calcium: 8.3 — AB (ref 8.7–10.7)

## 2021-03-10 LAB — CBC: RBC: 3.9 (ref 3.87–5.11)

## 2021-03-11 ENCOUNTER — Other Ambulatory Visit: Payer: Self-pay | Admitting: Hematology and Oncology

## 2021-03-11 DIAGNOSIS — Z79899 Other long term (current) drug therapy: Secondary | ICD-10-CM | POA: Diagnosis not present

## 2021-03-11 DIAGNOSIS — K909 Intestinal malabsorption, unspecified: Secondary | ICD-10-CM | POA: Diagnosis present

## 2021-03-11 DIAGNOSIS — Z9884 Bariatric surgery status: Secondary | ICD-10-CM | POA: Diagnosis present

## 2021-03-11 DIAGNOSIS — D508 Other iron deficiency anemias: Secondary | ICD-10-CM | POA: Diagnosis present

## 2021-03-11 LAB — IRON AND TIBC
Iron: 122 ug/dL (ref 28–170)
Saturation Ratios: 30 % (ref 10.4–31.8)
TIBC: 409 ug/dL (ref 250–450)
UIBC: 287 ug/dL

## 2021-03-11 LAB — FERRITIN: Ferritin: 123 ng/mL (ref 11–307)

## 2021-05-25 NOTE — Progress Notes (Signed)
[facility]  [address] [place],  [place]  [address] [phone]  Clinic Day:  05/31/2021  Referring physician: Nicoletta Dress, MD  This document serves as a record of services personally performed by Ayssa Bentivegna Macarthur Critchley, MD. It was created on their behalf by [facility], a trained medical scribe. The creation of this record is based on the scribe's personal observations and the provider's statements to them.  HISTORY OF PRESENT ILLNESS:  The patient is a 50 y.o. female with iron deficiency anemia secondary to her previous gastric bypass surgery.  She comes in today to reassess her iron and hemoglobin levels.  Since her last visit the patient has felt better.  She continues to deny having any overt forms of blood loss since her last visit.      PHYSICAL EXAM:  Blood pressure (!) 141/81, pulse 69, temperature 98.7 F (37.1 C), resp. rate 16, height 5\' 6"  (1.676 m), weight 190 lb 4.8 oz (86.3 kg), SpO2 99 %. Wt Readings from Last 3 Encounters:  05/31/21 190 lb 4.8 oz (86.3 kg)  01/29/21 185 lb 8 oz (84.1 kg)  11/25/20 190 lb 4 oz (86.3 kg)   Body mass index is 30.72 kg/m. Performance status (ECOG): 0 - Asymptomatic Physical Exam Constitutional:      Appearance: Normal appearance. She is not ill-appearing.  HENT:     Mouth/Throat:     Mouth: Mucous membranes are moist.     Pharynx: Oropharynx is clear. No oropharyngeal exudate or posterior oropharyngeal erythema.  Cardiovascular:     Rate and Rhythm: Normal rate and regular rhythm.     Heart sounds: No murmur heard. No friction rub. No gallop.   Pulmonary:     Effort: Pulmonary effort is normal. No respiratory distress.     Breath sounds: Normal breath sounds. No wheezing, rhonchi or rales.  Chest:  Breasts:     Right: No axillary adenopathy or supraclavicular adenopathy.     Left: No axillary adenopathy or supraclavicular adenopathy.   Abdominal:     General: Bowel sounds are  normal. There is no distension.     Palpations: Abdomen is soft. There is no mass.     Tenderness: There is no abdominal tenderness.  Musculoskeletal:        General: No swelling.     Right lower leg: No edema.     Left lower leg: No edema.  Lymphadenopathy:     Cervical: No cervical adenopathy.     Upper Body:     Right upper body: No supraclavicular or axillary adenopathy.     Left upper body: No supraclavicular or axillary adenopathy.     Lower Body: No right inguinal adenopathy. No left inguinal adenopathy.  Skin:    General: Skin is warm.     Coloration: Skin is not jaundiced.     Findings: No lesion or rash.  Neurological:     General: No focal deficit present.     Mental Status: She is alert and oriented to person, place, and time. Mental status is at baseline.     Cranial Nerves: Cranial nerves are intact.  Psychiatric:        Mood and Affect: Mood normal.        Behavior: Behavior normal.        Thought Content: Thought content normal.     LABS:   CBC Latest Ref Rng & Units 05/31/2021 03/10/2021 01/29/2021  WBC - 7.5 7.0 7.2  Hemoglobin  12.0 - 16.0 12.1 11.9(A) 12.2  Hematocrit 36 - 46 36 35(A) 34(A)  Platelets 150 - 399 257 248 242    Ref. Range 05/31/2021 16:02  Iron Latest Ref Range: 28 - 170 ug/dL 47  UIBC Latest Units: ug/dL 390  TIBC Latest Ref Range: 250 - 450 ug/dL 437  Saturation Ratios Latest Ref Range: 10.4 - 31.8 % 11  Ferritin Latest Ref Range: 11 - 307 ng/mL 147  Folate Latest Ref Range: >5.9 ng/mL 3.8 (L)  Vitamin B12 Latest Ref Range: 180 - 914 pg/mL 606    ASSESSMENT & PLAN:  A 50 y.o. female with iron deficiency anemia secondary to her previous gastric bypass surgery.  I am pleased as her hemoglobin is better versus her last visit.  Her iron parameters are slightly lower, but are still satisfactory.  Her vitamin B12 level is fine.   As her folate level is low, I will have her take at least 307 mcg of folic acid daily to improve her stores.   Clinically, she is doing well.  I will see her back in 6 months for repeat clinical assessment.   The patient understands all the plans discussed today and is in agreement with them.   I, Rita Ohara, am acting as scribe for Marice Potter, MD    I have reviewed this report as typed by the medical scribe, and it is complete and accurate.  Prescilla Monger Macarthur Critchley, MD

## 2021-05-31 ENCOUNTER — Other Ambulatory Visit: Payer: Self-pay | Admitting: Oncology

## 2021-05-31 ENCOUNTER — Telehealth: Payer: Self-pay | Admitting: Oncology

## 2021-05-31 ENCOUNTER — Other Ambulatory Visit: Payer: Self-pay | Admitting: Hematology and Oncology

## 2021-05-31 ENCOUNTER — Inpatient Hospital Stay: Payer: 59 | Attending: Oncology | Admitting: Oncology

## 2021-05-31 ENCOUNTER — Inpatient Hospital Stay: Payer: 59

## 2021-05-31 DIAGNOSIS — D509 Iron deficiency anemia, unspecified: Secondary | ICD-10-CM | POA: Insufficient documentation

## 2021-05-31 DIAGNOSIS — D508 Other iron deficiency anemias: Secondary | ICD-10-CM

## 2021-05-31 DIAGNOSIS — Z9884 Bariatric surgery status: Secondary | ICD-10-CM | POA: Diagnosis not present

## 2021-05-31 DIAGNOSIS — E538 Deficiency of other specified B group vitamins: Secondary | ICD-10-CM | POA: Diagnosis not present

## 2021-05-31 LAB — IRON AND TIBC
Iron: 47 ug/dL (ref 28–170)
Saturation Ratios: 11 % (ref 10.4–31.8)
TIBC: 437 ug/dL (ref 250–450)
UIBC: 390 ug/dL

## 2021-05-31 LAB — VITAMIN B12: Vitamin B-12: 606 pg/mL (ref 180–914)

## 2021-05-31 LAB — FOLATE: Folate: 3.8 ng/mL — ABNORMAL LOW (ref >5.9)

## 2021-05-31 LAB — CBC AND DIFFERENTIAL
HCT: 36 (ref 36–46)
Hemoglobin: 12.1 (ref 12.0–16.0)
Neutrophils Absolute: 4.95
Platelets: 257 (ref 150–399)
WBC: 7.5

## 2021-05-31 LAB — CBC: RBC: 4 (ref 3.87–5.11)

## 2021-05-31 LAB — FERRITIN: Ferritin: 147 ng/mL (ref 11–307)

## 2021-05-31 NOTE — Telephone Encounter (Signed)
Per 10/17 LOS, patient scheduled for April 2023 Appt's.  Patient recording in her Calendar

## 2021-06-30 ENCOUNTER — Ambulatory Visit: Payer: 59 | Admitting: Gastroenterology

## 2021-07-15 ENCOUNTER — Other Ambulatory Visit: Payer: Self-pay

## 2021-07-15 ENCOUNTER — Encounter: Payer: Self-pay | Admitting: Gastroenterology

## 2021-07-15 ENCOUNTER — Ambulatory Visit (INDEPENDENT_AMBULATORY_CARE_PROVIDER_SITE_OTHER): Payer: 59 | Admitting: Gastroenterology

## 2021-07-15 VITALS — BP 122/84 | HR 72 | Ht 66.0 in | Wt 191.5 lb

## 2021-07-15 DIAGNOSIS — R935 Abnormal findings on diagnostic imaging of other abdominal regions, including retroperitoneum: Secondary | ICD-10-CM | POA: Diagnosis not present

## 2021-07-15 DIAGNOSIS — R7989 Other specified abnormal findings of blood chemistry: Secondary | ICD-10-CM

## 2021-07-15 NOTE — Progress Notes (Signed)
Chief Complaint: Abnormal LFTs  Referring Provider:  Nicoletta Dress, MD      ASSESSMENT AND PLAN;    1. PBC Dx on Liver Bx 03/2020, elevated AMA. Nl Alk Phos now. Has associated fatty liver. 2. H/O morbid obesity s/p RYGB 2008.  No obvious significant liver cirrhosis. Nl Plts, Alb 3. Abn MRI 03/2020 showing 6 mm pancreatic cyst, bilateral renal cysts/lesions. 4. Neg screening colonoscopy 2017 by Dr. Melina Copa. Rpt in 10 yrs. 5. IDA d/t gastric bypass on periodic parenteral iron (Dr Bobby Rumpf).  Has assoc B12 and folate deficiency.   Plan: -Continue Urso 500 mg p.o. twice daily.  -MRI abdo with and without contrast as recommended by radiology.  She would need CMP before. -Encouraged her to lose weight. -RTC in 1 year.    HPI:    Angela Miles is a 50 y.o. female  With H/O RYGB 2008 (with resultant 129lb wt loss), anxiety/depression, HTN, mild obesity  For follow-up visit.  Doing very well.  Alk phos is back to normal.  ALT trending down (see table below). No itching.  She is tolerating her Urso well.  Has been on B12 shots every monthly, p.o. folate has been added.  She also gets periodic IV iron from Dr. Bobby Rumpf for anemia related to gastric bypass surgery.  Weight has been more or less same as before.  No nausea, vomiting, heartburn, regurgitation, odynophagia or dysphagia.  No significant diarrhea or constipation.  No melena or hematochezia. No unintentional weight loss. No abdominal pain.  No alcohol.  She had gone to mountains for her [place] in May 2022.  Ate steak and then had acute gastroenteritis requiring visit to ED at [facility].  Had CT scan showing acute gastroenteritis with small bowel thickening.  She was also given antibiotics.  No H/O itching, skin lesions, intake of OTC meds including diet pills, herbal medications, anabolic steroids or Tylenol. There is no H/O blood transfusions, IVDA or FH of liver disease. No jaundice, dark urine or pale  stools. No alcohol abuse.  Does have history of easy bruisability.    Previous GI work-up:  Liver bx 03/2020 revealed PBC with grade 1-2/4 fibrosis.  Neg colonoscopy by Dr. Melina Copa in 2017.  Repeat in 10 years  MRI liver with and without contrast 03/2020 1. No acute findings within the abdomen. 2. Previous cholecystectomy. 3. Small 6 mm cystic structure associated with the neck of pancreas is identified. This is technically too small to further characterize. Follow-up imaging with repeat MRI in 12 months without and with contrast material recommended. 4. Bilateral subcentimeter T2 hyperintense kidney lesions are technically too small to reliably characterize. The largest is in the anterior cortex of the inferior pole of the right kidney measuring 8 mm. This may be mildly complex containing a thin internal area of septation. Attention on follow-up MRI is recommended.  CT AP June 2021 showing significant heterogenous enhancement pattern throughout the liver which could be related to intrinsic liver disease.  No definite liver cirrhosis.  From previous note: referred to GI clinic for abn LFTs: AST 56, ALT 106, alk phos 132 with normal CBC hemoglobin 13.0, platelets 312.  Note that iron studies were normal.    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 Anemia   . Asthma    bronchitis  . Cancer [facility])     Past Surgical History:  Procedure Laterality Date  . BREAST EXCISIONAL BIOPSY Right     2014  . BREAST LUMPECTOMY WITH NEEDLE LOCALIZATION Right 02/26/2013   Procedure: BREAST LUMPECTOMY WITH NEEDLE LOCALIZATION;  Surgeon: Marcello Moores A. Cornett, MD;  Location: [facility];  Service: General;  Laterality: Right;  NEEDLE LOCALIZATION AT [employer] 7;30   . CERVICAL BIOPSY    . CESAREAN SECTION  02,05  . CHOLECYSTECTOMY    . COLONOSCOPY     Dr Orlena Sheldon 2-14 around 2017  . DILATION AND CURETTAGE OF UTERUS  2009   uterine ablation  . ESOPHAGOGASTRODUODENOSCOPY     around 08  . GASTRIC BYPASS  08    Family History  Problem Relation Age of Onset  . Hypertension Mother   . Colon polyps Father   . Dementia Father   . Hypertension Father   . Colon cancer Paternal Barbaraann Rondo        has a colestomy bag now. Around 60's  . Esophageal cancer Neg Hx     Social History   Tobacco Use  . Smoking status: Former    Types: Cigarettes    Quit date: 02/11/2009    Years since quitting: 12.4  . Smokeless tobacco: Never  Vaping Use  . Vaping Use: Never used  Substance Use Topics  . Alcohol use: Not Currently    Comment: rare  . Drug use: No    Current Outpatient Medications  Medication Sig Dispense Refill  . estradiol (ESTRACE) 0.1 MG/GM vaginal cream estradiol 0.01% (0.1 mg/gram) vaginal cream  Insert 1 g twice a week by vaginal route.    . fluconazole (DIFLUCAN) 150 MG tablet fluconazole 150 mg tablet    . HYDROcodone-acetaminophen (NORCO/VICODIN) 5-325 MG tablet hydrocodone 5 mg-acetaminophen 325 mg tablet    . MAGNESIUM PO Take 483 mg by mouth at bedtime.    Marland Kitchen MELATONIN GUMMIES PO Take by mouth at bedtime.    . meloxicam (MOBIC) 15 MG tablet TAKE 1 TABLET BY MOUTH ONCE DAILY. **NEEDS APPOINTMENT FOR REFILLS** 30 tablet 0  . tiZANidine (ZANAFLEX) 4 MG tablet tizanidine 4 mg tablet    . ursodiol (ACTIGALL) 250 MG tablet TAKE 2 TABLET BY MOUTH EVERY MORNING AND AT BEDTIME 120 tablet 8  . vitamin E 1000 UNIT capsule Take 1,000 Units by mouth daily.     No current  facility-administered medications for this visit.    Allergies  Allergen Reactions  . Penicillins Nausea Only  . Sulfa Antibiotics Palpitations    Review of Systems:  neg     Physical Exam:    There were no vitals taken for this visit. Wt Readings from Last 3 Encounters:  05/31/21 190 lb 4.8 oz (86.3 kg)  01/29/21 185 lb 8 oz (84.1 kg)  11/25/20 190 lb 4 oz (86.3 kg)   Constitutional:  Well-developed, in no acute distress. Psychiatric: Normal mood and affect. Behavior is normal. HEENT: Pupils normal.  Conjunctivae are normal. No  scleral icterus. Neck supple.  Cardiovascular: Normal rate, regular rhythm. No edema Pulmonary/chest: Effort normal and breath sounds normal. No wheezing, rales or rhonchi. Abdominal: Soft, nondistended. Nontender. Bowel sounds active throughout. There are no masses palpable. Has hepatomegaly -liver palpated 5 cm below the costal margin. Rectal:  defered Neurological: Alert and oriented to person place and time. Skin: Skin is warm and dry. No rashes noted.  Data Reviewed: I have personally reviewed following labs and imaging studies  CBC: CBC Latest Ref Rng & Units 05/31/2021 03/10/2021 01/29/2021  WBC - 7.5 7.0 7.2  Hemoglobin 12.0 - 16.0 12.1 11.9(A) 12.2  Hematocrit 36 - 46 36 35(A) 34(A)  Platelets 150 - 399 257 248 242    CMP: CMP Latest Ref Rng & Units 03/10/2021 09/25/2020 06/22/2020  Glucose 70 - 99 mg/dL - 202(H) 90  BUN 4 - _0 Creatinine 0.5 - 1.1 0.5 0.66 0.69  Sodium 137 - 147 137 137 137  Potassium 3.4 - 5.3 3.7 3.6 4.0  Chloride 99 - 108 102 102 103  CO2 13 - 22 27(A) 25 27  Calcium 8.7 - 10.7 8.3(A) 8.7 8.5  Total Protein 6.0 - 8.3 g/dL - 7.4 6.8  Total Bilirubin 0.2 - 1.2 mg/dL - 0.5 0.5  Alkaline Phos 25 - 125 89 129(H) 96  AST 13 - 35 _1 ALT 7 - 35 40(A) 63(H) 24     Carmell Austria, MD 07/15/2021, 4:16 PM  Cc: Nicoletta Dress, MD

## 2021-07-15 NOTE — Patient Instructions (Addendum)
If you are age 50 or older, your body mass index should be between 23-30. Your Body mass index is 30.91 kg/m. If this is out of the aforementioned range listed, please consider follow up with your Primary Care Provider.  If you are age 28 or younger, your body mass index should be between 19-25. Your Body mass index is 30.91 kg/m. If this is out of the aformentioned range listed, please consider follow up with your Primary Care Provider.   ________________________________________________________  The [facility] providers would like to encourage you to use [facility] to communicate with providers for non-urgent requests or questions.  Due to long hold times on the telephone, sending your provider a message by [facility] may be a faster and more efficient way to get a response.  Please allow 48 business hours for a response.  Please remember that this is for non-urgent requests.  _______________________________________________________  [facility] [place] [place] [address]  [facility]-2021. Your appointment time is  10am. Please arrive to admitting (at main entrance of the hospital) 30 minutes prior to your appointment time for registration purposes. Please make certain not to have anything to eat or drink 6 hours prior to your test. In addition, if you have any metal in your body, have a pacemaker or defibrillator, please be sure to let your ordering physician know. This test typically takes 45 minutes to 1 hour to complete. Should you need to reschedule, please call [phone] to do so.  Please get lab work done at [facility] by 07-24-2021. You were given the lab order to carry with you. We did fax the order for you in case as well. We will need the results by 07-28-2021. Fax number [phone]. Make sure we have this please.  Please follow up in 1 year by calling our office to schedule a follow up appointment.   Thank you,  Dr. Jackquline Denmark

## 2021-07-21 LAB — COMPREHENSIVE METABOLIC PANEL
ALT: 53 IU/L — ABNORMAL HIGH (ref 0–32)
AST: 28 IU/L (ref 0–40)
Albumin/Globulin Ratio: 1.5 (ref 1.2–2.2)
Albumin: 4.2 g/dL (ref 3.8–4.8)
Alkaline Phosphatase: 195 IU/L — ABNORMAL HIGH (ref 44–121)
BUN/Creatinine Ratio: 21 (ref 9–23)
BUN: 15 mg/dL (ref 6–24)
Bilirubin Total: 0.3 mg/dL (ref 0.0–1.2)
CO2: 26 mmol/L (ref 20–29)
Calcium: 9.1 mg/dL (ref 8.7–10.2)
Chloride: 101 mmol/L (ref 96–106)
Creatinine, Ser: 0.7 mg/dL (ref 0.57–1.00)
Globulin, Total: 2.8 g/dL (ref 1.5–4.5)
Glucose: 87 mg/dL (ref 70–99)
Potassium: 3.9 mmol/L (ref 3.5–5.2)
Sodium: 139 mmol/L (ref 134–144)
Total Protein: 7 g/dL (ref 6.0–8.5)
eGFR: 105 mL/min/{1.73_m2} (ref >59)

## 2021-07-28 ENCOUNTER — Other Ambulatory Visit: Payer: Self-pay

## 2021-07-28 DIAGNOSIS — R7989 Other specified abnormal findings of blood chemistry: Secondary | ICD-10-CM

## 2021-07-29 ENCOUNTER — Other Ambulatory Visit: Payer: Self-pay | Admitting: Gastroenterology

## 2021-07-29 ENCOUNTER — Ambulatory Visit (HOSPITAL_COMMUNITY)
Admission: RE | Admit: 2021-07-29 | Discharge: 2021-07-29 | Disposition: A | Discharge status: Home / Self Care | Payer: 59 | Source: Ambulatory Visit | Attending: Gastroenterology | Admitting: Gastroenterology

## 2021-07-29 ENCOUNTER — Other Ambulatory Visit: Payer: Self-pay

## 2021-07-29 DIAGNOSIS — R935 Abnormal findings on diagnostic imaging of other abdominal regions, including retroperitoneum: Secondary | ICD-10-CM | POA: Insufficient documentation

## 2021-07-29 IMAGING — MR MR ABDOMEN WO/W CM
18 of 22 series · 40 of 48 positions shown · IV contrast (9 GADAVIST)
Comparison: MRI [DATE]

CLINICAL DATA: Follow-up pancreatic cystic lesion.

EXAM:
MRI ABDOMEN WITHOUT AND WITH CONTRAST
TECHNIQUE: Multiplanar multisequence MR imaging of the abdomen was performed
both before and after the administration of intravenous contrast.
CONTRAST:  9mL GADAVIST GADOBUTROL 1 MMOL/ML IV SOLN

[Series 3: DWI · axial · 6.0mm · 1.49mm/px · z∈[-64,+203]mm · 2 of 76 slices shown (1 of 2)]
[im 1/76]
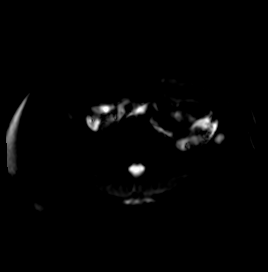
[im 76/76]
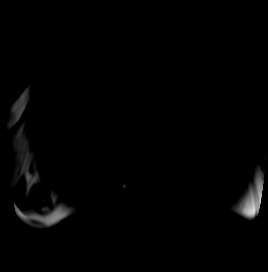

[Series 4: DWI · axial · 6.0mm · 1.49mm/px · 1 of 38 slices shown (2 of 2)]
[im 1/38]
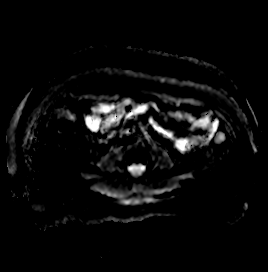

[Series 6: cor_3d_spc_trig · coronal · 1.0mm · 0.49mm/px · 2 of 72 slices shown]
[im 1/72]
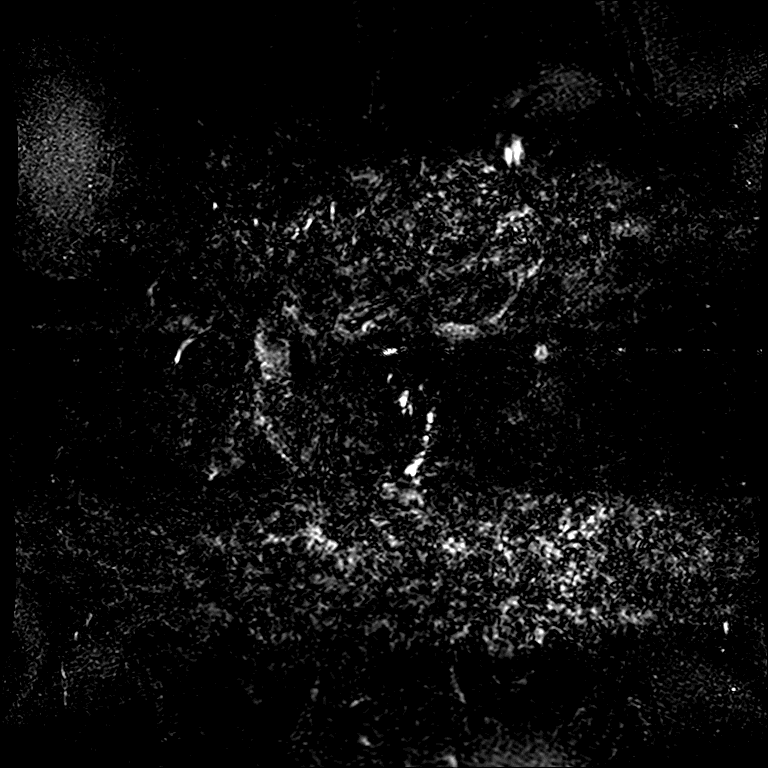
[im 72/72]
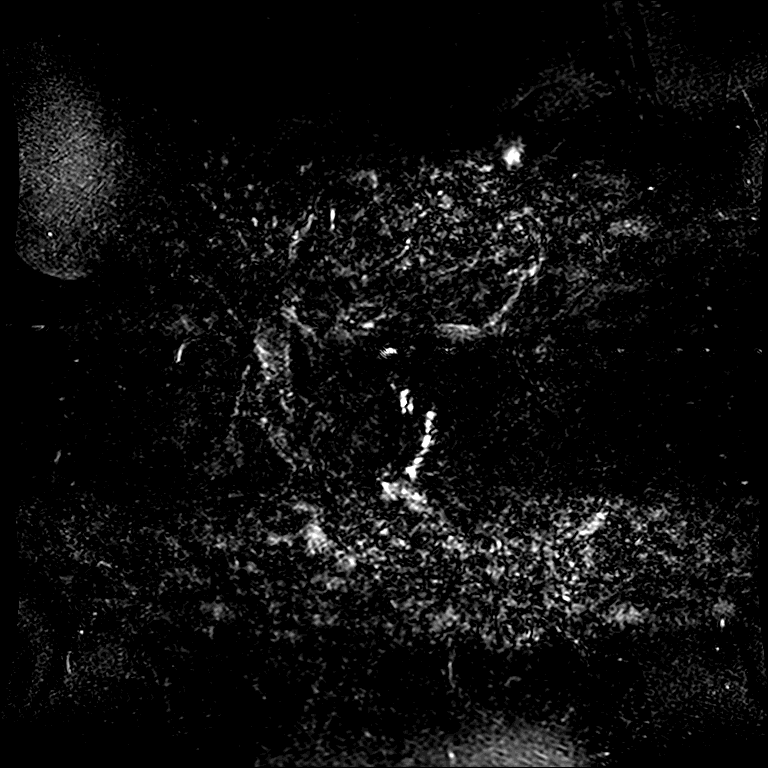

[Series 9: T2 fat-sat · axial · 6.0mm · 1.25mm/px · 1 of 36 slices shown]
[im 1/36]
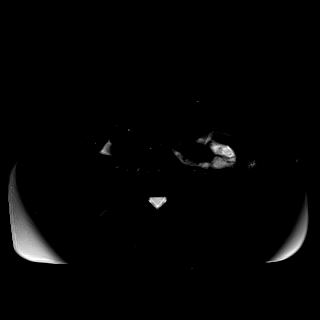

[Series 10: T2 · coronal · 6.0mm · 1.56mm/px · 1 of 26 slices shown (1 of 2)]
[im 1/26]
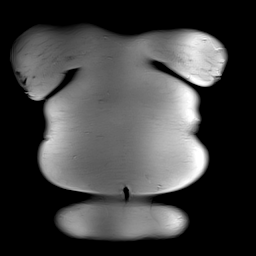

[Series 12: cor obl thk · coronal · 50.0mm · 0.78mm/px · 1 of 9 slices shown]
[im 1/9]
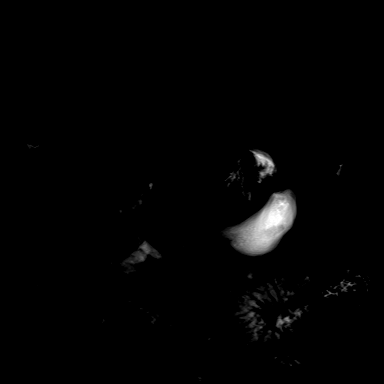

[Series 13: T1 · axial · 3.0mm · 1.25mm/px · z∈[-88,+148]mm · 2 of 80 slices shown (1 of 2)]
[im 1/80]
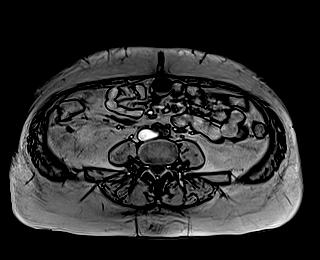
[im 80/80]
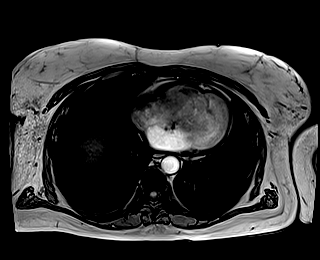

[Series 14: T1 · axial · 3.0mm · 1.25mm/px · z∈[-88,+148]mm · 3 of 80 slices shown (2 of 2)]
[im 1/80]
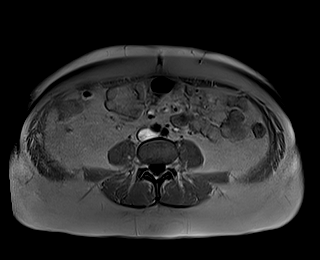
[im 40/80]
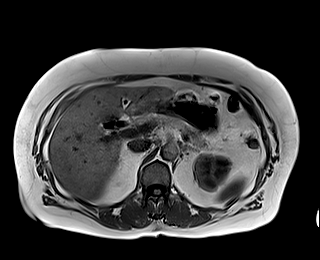
[im 80/80]
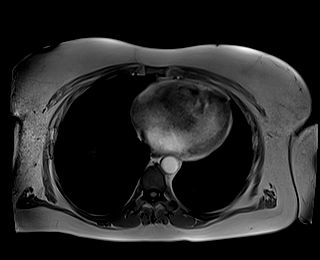

[Series 15: bSSFP · axial · 4.0mm · 0.84mm/px · z∈[-81,+135]mm · 2 of 55 slices shown]
[im 1/55]
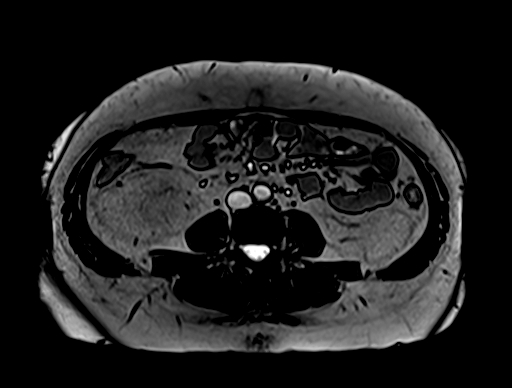
[im 55/55]
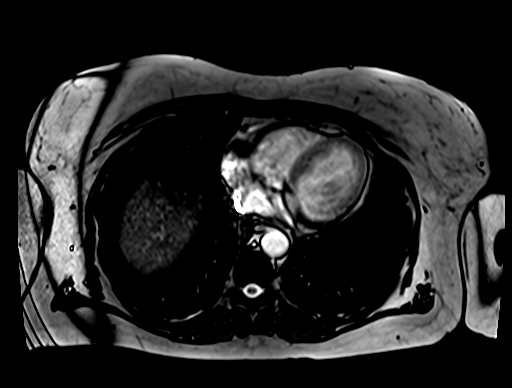

[Series 17: T1 dynamic · axial · 3.0mm · 1.25mm/px · z∈[-95,+142]mm · 3 of 80 slices shown (1 of 8)]
[im 1/80]
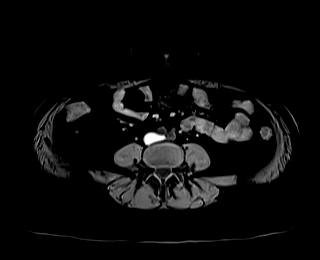
[im 40/80]
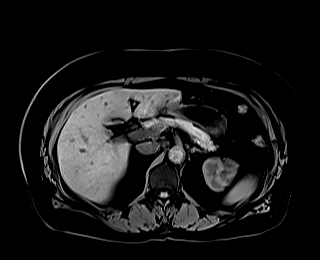
[im 80/80]
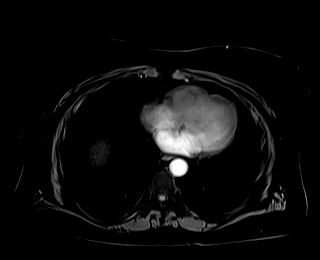

[Series 21: T1 dynamic · axial · 3.0mm · 1.25mm/px · z∈[-95,+142]mm · 3 of 80 slices shown (2 of 8)]
[im 1/80]
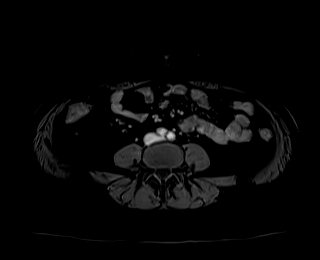
[im 40/80]
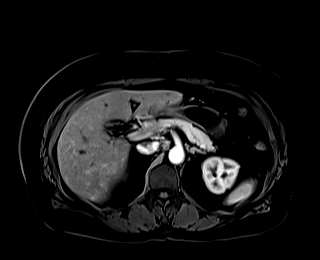
[im 80/80]
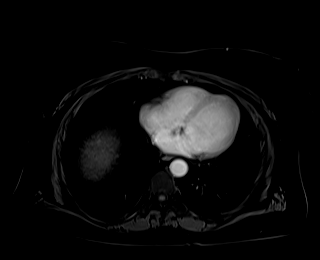

[Series 22: T1 dynamic · axial · 3.0mm · 1.25mm/px · z∈[-95,+142]mm · 3 of 80 slices shown (3 of 8)]
[im 1/80]
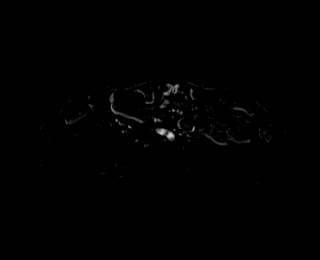
[im 40/80]
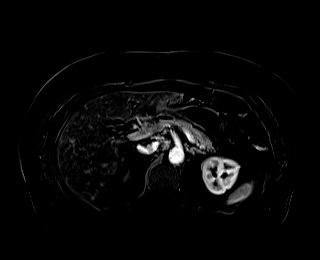
[im 80/80]
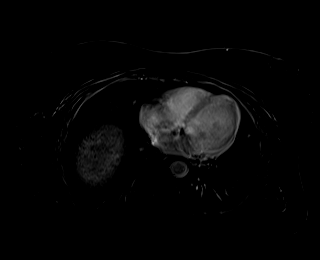

[Series 25: T1 dynamic · axial · 3.0mm · 1.25mm/px · z∈[-95,+142]mm · 3 of 80 slices shown (4 of 8)]
[im 1/80]
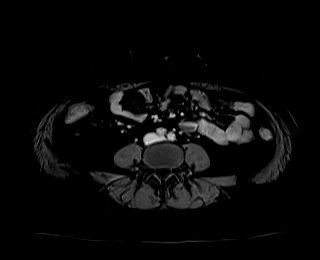
[im 40/80]
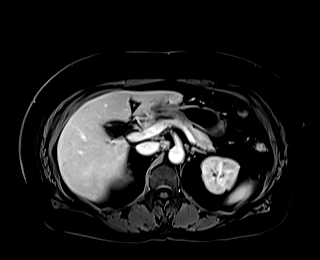
[im 80/80]
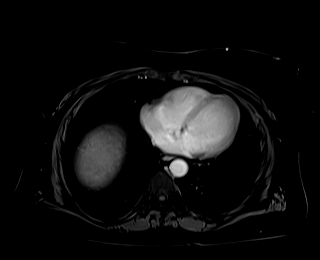

[Series 26: T1 dynamic · axial · 3.0mm · 1.25mm/px · z∈[-95,+142]mm · 3 of 80 slices shown (5 of 8)]
[im 1/80]
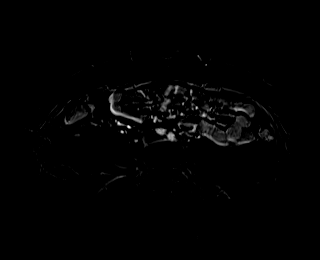
[im 40/80]
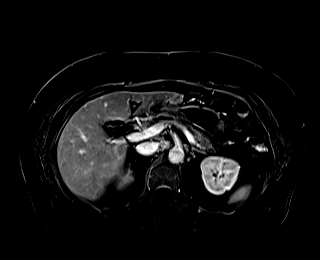
[im 80/80]
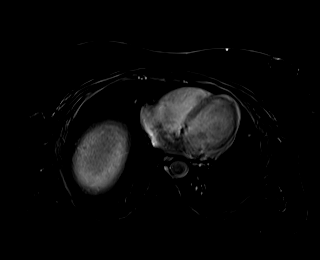

[Series 29: T1 dynamic · axial · 3.0mm · 1.25mm/px · z∈[-95,+142]mm · 3 of 80 slices shown (6 of 8)]
[im 1/80]
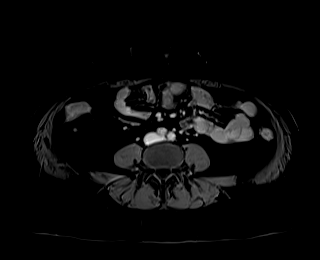
[im 40/80]
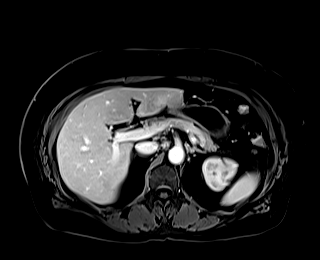
[im 80/80]
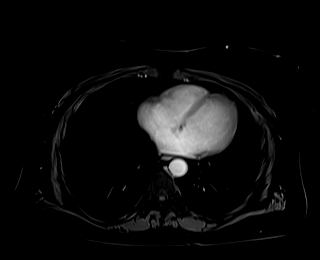

[Series 30: T1 dynamic · axial · 3.0mm · 1.25mm/px · z∈[-95,+142]mm · 3 of 80 slices shown (7 of 8)]
[im 1/80]
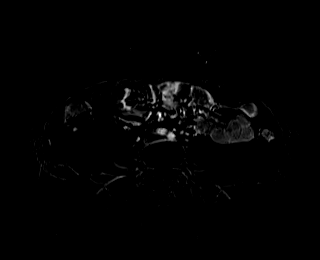
[im 40/80]
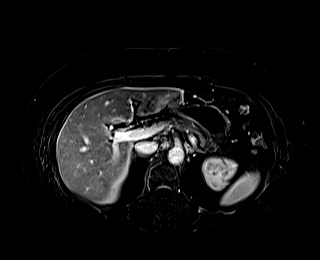
[im 80/80]
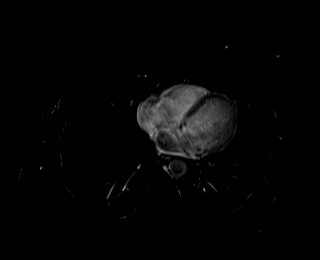

[Series 31: T2 · axial · 6.0mm · 1.56mm/px · 1 of 36 slices shown (2 of 2)]
[im 1/36]
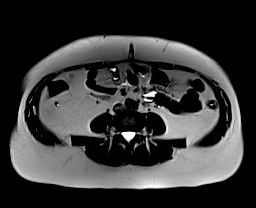

[Series 34: T1 dynamic · coronal · 3.0mm · 1.41mm/px · 3 of 72 slices shown (8 of 8)]
[im 1/72]
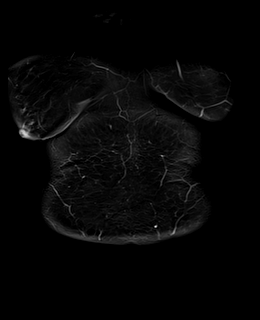
[im 36/72]
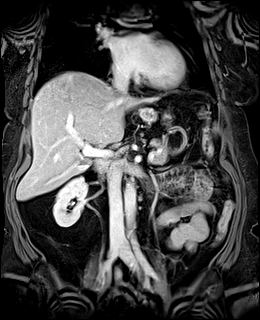
[im 72/72]
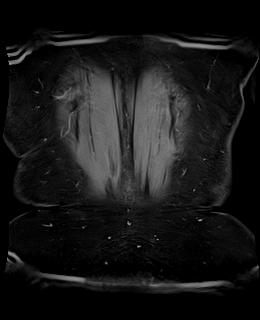

[40 of 48 positions shown; findings below may reference images not displayed]

FINDINGS: Lower chest: No acute abnormality.

Hepatobiliary: No hepatic steatosis. No suspicious hepatic lesion.
Gallbladder surgically absent. No biliary ductal dilation.

Pancreas: Stable well-circumscribed 6 mm T2 hyperintense cystic
structure in the neck of the pancreas on image 23/31 without
suspicious MRI features. No pancreatic ductal dilation. No new
suspicious pancreatic lesions.

Spleen:  Within normal limits.

Adrenals/Urinary Tract: Bilateral adrenal unremarkable. No
hydronephrosis. Bilateral T2 hyperintense renal lesions are
unchanged from prior including an 9 mm lesion lower pole of the
right kidney which demonstrates some thin internal septations
without enhancing wall thickening or nodularity. No solid enhancing
renal mass.

Stomach/Bowel: Visualized portions within the abdomen are
unremarkable.

Vascular/Lymphatic: No pathologically enlarged lymph nodes
identified. No abdominal aortic aneurysm demonstrated.

Other:  No abdominopelvic free fluid.

Musculoskeletal: No suspicious bone lesions identified.
IMPRESSION: 1. Stable 6 mm well-circumscribed cystic structure in the neck of
the pancreas without suspicious MRI features. This likely represents
a side branch IPMN. Recommend follow up pre and post contrast
MRI/MRCP or pancreatic protocol CT in 1 year. This recommendation
follows ACR consensus guidelines: Management of Incidental
Pancreatic Cysts: A White Paper of the ACR Incidental Findings
Committee. [HOSPITAL] [IQ];[DATE].
2. Stable bilateral Bosniak classification 1 and 2 renal cysts.

## 2021-07-29 MED ORDER — GADOBUTROL 1 MMOL/ML IV SOLN
9.0000 mL | Freq: Once | INTRAVENOUS | Status: AC | PRN
  Administered 2021-07-29: 9 mL via INTRAVENOUS

## 2021-11-28 NOTE — Progress Notes (Signed)
?[facility]  ?[address] ?[place],  [place]  [address] ?[phone] ? ?Clinic Day:  11/29/2020 ? ?Referring physician: Nicoletta Dress, MD ? ?HISTORY OF PRESENT ILLNESS:  ?The patient is a 51 y.o. female with iron deficiency anemia secondary to her previous gastric bypass surgery.  She comes in today to reassess her iron and hemoglobin levels.  Since her last visit the patient has felt okay.                                                                                                                                                                                                                            She continues to deny having any overt forms of blood loss since her last visit,     ? ?PHYSICAL EXAM:  ?Blood pressure 140/67, pulse 88, temperature (!) 97.4 ?F (36.3 ?C), temperature source Oral, resp. rate 16, weight 182 lb 1.6 oz (82.6 kg), SpO2 99 %. ?Wt Readings from Last 3 Encounters:  ?11/29/21 182 lb 1.6 oz (82.6 kg)  ?07/15/21 191 lb 8 oz (86.9 kg)  ?05/31/21 190 lb 4.8 oz (86.3 kg)  ? ?Body mass index is 29.39 kg/m?Marland Kitchen ?Performance status (ECOG): 0 - Asymptomatic ?Physical Exam ?Constitutional:   ?   Appearance: Normal appearance. She is not ill-appearing.  ?HENT:  ?   Mouth/Throat:  ?   Mouth: Mucous membranes are moist.  ?   Pharynx: Oropharynx is clear. No oropharyngeal exudate or posterior oropharyngeal erythema.  ?Cardiovascular:  ?   Rate and Rhythm: Normal rate and regular rhythm.  ?   Heart sounds: No murmur heard. ?No friction rub. No gallop.   ?Pulmonary:  ?   Effort: Pulmonary effort is normal. No respiratory distress.  ?   Breath sounds: Normal breath sounds. No wheezing, rhonchi or rales.  ?Chest:  ?Breasts:  ?   Right: No axillary adenopathy or supraclavicular adenopathy.  ?   Left: No axillary adenopathy or supraclavicular adenopathy.  ? Abdominal:  ?   General: Bowel sounds are normal. There is no distension.  ?   Palpations: Abdomen is soft. There  is no mass.  ?   Tenderness: There is no abdominal tenderness.  ?Musculoskeletal:     ?   General: No swelling.  ?   Right lower leg: No edema.  ?   Left lower leg: No edema.  ?Lymphadenopathy:  ?   Cervical: No cervical adenopathy.  ?  Upper Body:  ?   Right upper body: No supraclavicular or axillary adenopathy.  ?   Left upper body: No supraclavicular or axillary adenopathy.  ?   Lower Body: No right inguinal adenopathy. No left inguinal adenopathy.  ?Skin: ?   General: Skin is warm.  ?   Coloration: Skin is not jaundiced.  ?   Findings: No lesion or rash.  ?Neurological:  ?   General: No focal deficit present.  ?   Mental Status: She is alert and oriented to person, place, and time. Mental status is at baseline.  ?   Cranial Nerves: Cranial nerves are intact.  ?Psychiatric:     ?   Mood and Affect: Mood normal.     ?   Behavior: Behavior normal.     ?   Thought Content: Thought content normal.  ?  ? ?LABS:  ? ? ?  Latest Ref Rng & Units 11/29/2021  ? 12:00 AM 05/31/2021  ? 12:00 AM 03/10/2021  ? 12:00 AM  ?CBC  ?WBC  9.1      7.5      7.0    ?Hemoglobin 12.0 - 16.0 13.1      12.1      11.9    ?Hematocrit 36 - 46 40      36      35    ?Platelets 150 - 400 K/uL 303      257      248    ?  ? This result is from an external source.  ? ? Latest Reference Range & Units 11/29/21 16:02 11/29/21 16:03  ?Iron 28 - 170 ug/dL 77   ?UIBC ug/dL 369   ?TIBC 250 - 450 ug/dL 446   ?Saturation Ratios 10.4 - 31.8 % 17   ?Ferritin 11 - 307 ng/mL 159   ?Folate >5.9 ng/mL  63.2  ?Vitamin B12 180 - 914 pg/mL 702   ? ? ?ASSESSMENT & PLAN:  ?A 51 y.o. female with iron deficiency anemia secondary to her previous gastric bypass surgery.  I am pleased as her hemoglobin is better versus her last visit.  Her iron parameters also remain fine.  I am also pleased as her folate level has improved.  She knows to continue taking her daily allotment of folic acid tablets.  Her vitamin B12 level is also fine.  Clinically, she is doing well.  I will  see her back in 6 months for repeat clinical assessment.   The patient understands all the plans discussed today and is in agreement with them. ? ?Milbern Doescher Macarthur Critchley, MD ? ? ? ?  ?

## 2021-11-29 ENCOUNTER — Inpatient Hospital Stay: Payer: 59 | Admitting: Oncology

## 2021-11-29 ENCOUNTER — Inpatient Hospital Stay: Payer: 59 | Attending: Oncology

## 2021-11-29 ENCOUNTER — Other Ambulatory Visit: Payer: Self-pay | Admitting: Oncology

## 2021-11-29 VITALS — BP 140/67 | HR 88 | Temp 97.4°F | Resp 16 | Wt 182.1 lb

## 2021-11-29 DIAGNOSIS — Z9884 Bariatric surgery status: Secondary | ICD-10-CM | POA: Diagnosis not present

## 2021-11-29 DIAGNOSIS — D508 Other iron deficiency anemias: Secondary | ICD-10-CM

## 2021-11-29 LAB — IRON AND TIBC
Iron: 77 ug/dL (ref 28–170)
Saturation Ratios: 17 % (ref 10.4–31.8)
TIBC: 446 ug/dL (ref 250–450)
UIBC: 369 ug/dL

## 2021-11-29 LAB — CBC AND DIFFERENTIAL
HCT: 40 (ref 36–46)
Hemoglobin: 13.1 (ref 12.0–16.0)
Neutrophils Absolute: 6.46
Platelets: 303 10*3/uL (ref 150–400)
WBC: 9.1

## 2021-11-29 LAB — FERRITIN: Ferritin: 159 ng/mL (ref 11–307)

## 2021-11-29 LAB — VITAMIN B12: Vitamin B-12: 702 pg/mL (ref 180–914)

## 2021-11-29 LAB — CBC: RBC: 4.51 (ref 3.87–5.11)

## 2021-11-29 LAB — FOLATE: Folate: 63.2 ng/mL (ref >5.9)

## 2021-12-07 ENCOUNTER — Other Ambulatory Visit: Payer: Self-pay | Admitting: Obstetrics & Gynecology

## 2021-12-07 DIAGNOSIS — N6489 Other specified disorders of breast: Secondary | ICD-10-CM

## 2021-12-08 ENCOUNTER — Encounter: Payer: Self-pay | Admitting: Oncology

## 2022-02-03 ENCOUNTER — Ambulatory Visit: Payer: 59

## 2022-02-03 ENCOUNTER — Ambulatory Visit
Admission: RE | Admit: 2022-02-03 | Discharge: 2022-02-03 | Disposition: A | Discharge status: Home / Self Care | Payer: 59 | Source: Ambulatory Visit | Attending: Obstetrics & Gynecology | Admitting: Obstetrics & Gynecology

## 2022-02-03 DIAGNOSIS — N6489 Other specified disorders of breast: Secondary | ICD-10-CM

## 2022-02-03 IMAGING — MG DIGITAL DIAGNOSTIC BILAT W/ TOMO W/ CAD
8 of 15 series · 8 of 40 positions shown · non-contrast
Comparison: Previous exam(s).

CLINICAL DATA: Two year follow-up for probably benign RIGHT breast
asymmetry without sonographic correlate.

EXAM:
DIGITAL DIAGNOSTIC BILATERAL MAMMOGRAM WITH TOMOSYNTHESIS AND CAD
TECHNIQUE: Bilateral digital diagnostic mammography and breast tomosynthesis
was performed. The images were evaluated with computer-aided
detection.

[L MLO synth-2D]
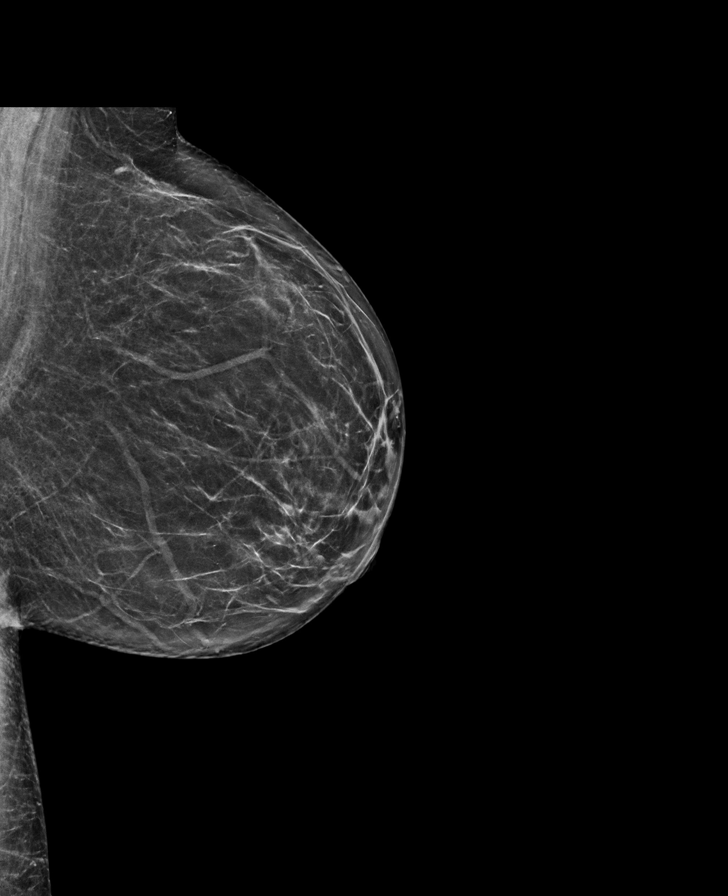

[R MLO synth-2D]
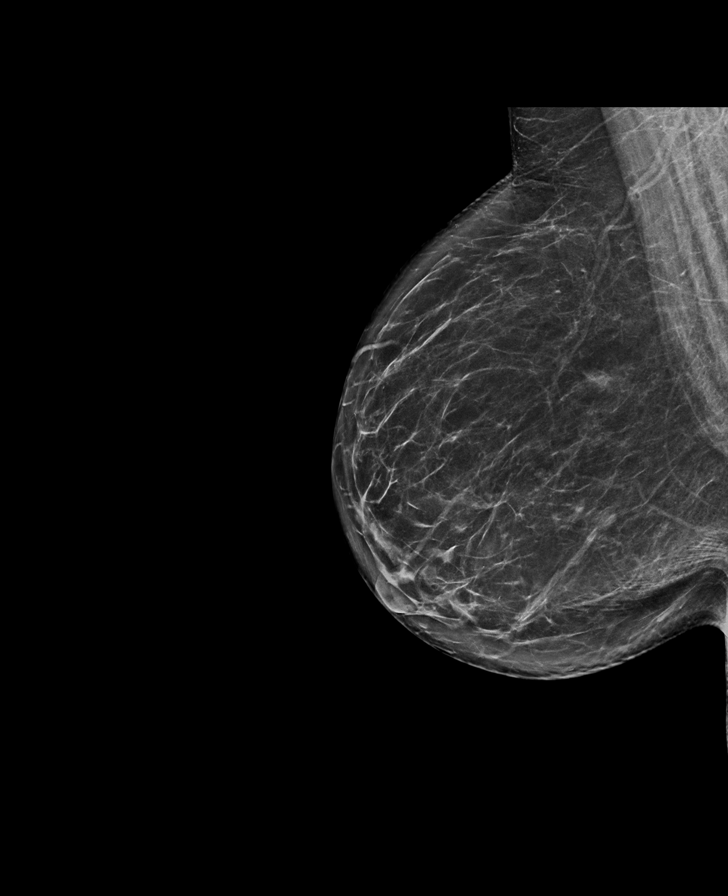

[R ML synth-2D]
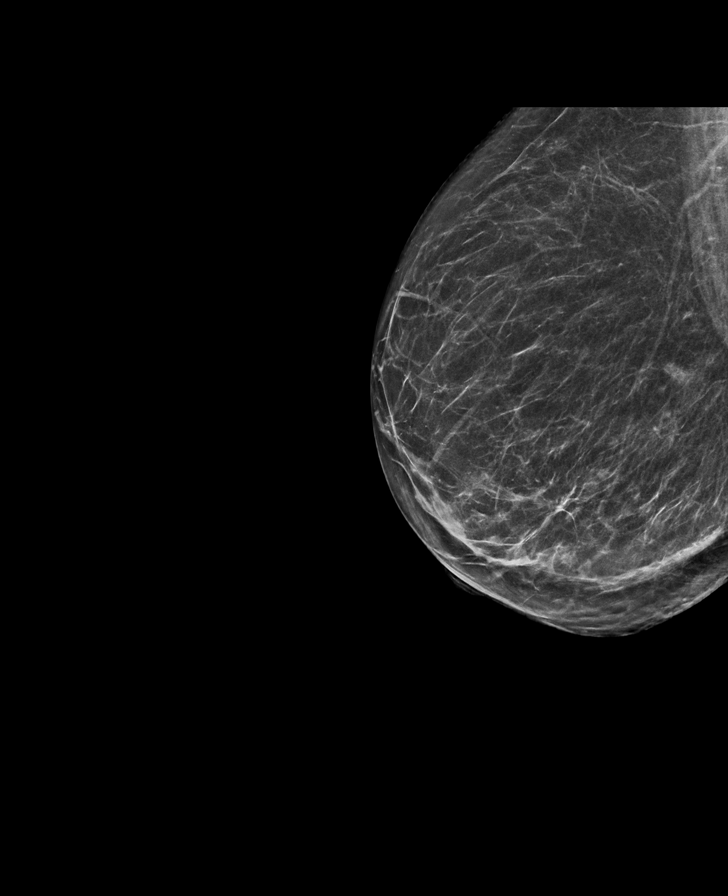

[R CC synth-2D (1 of 3)]
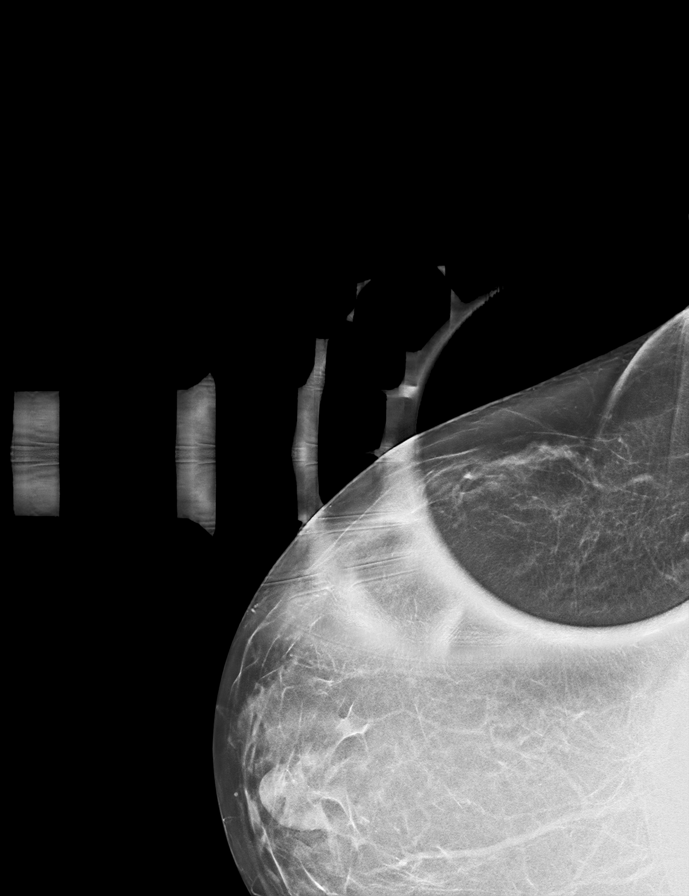

[R CC synth-2D (2 of 3)]
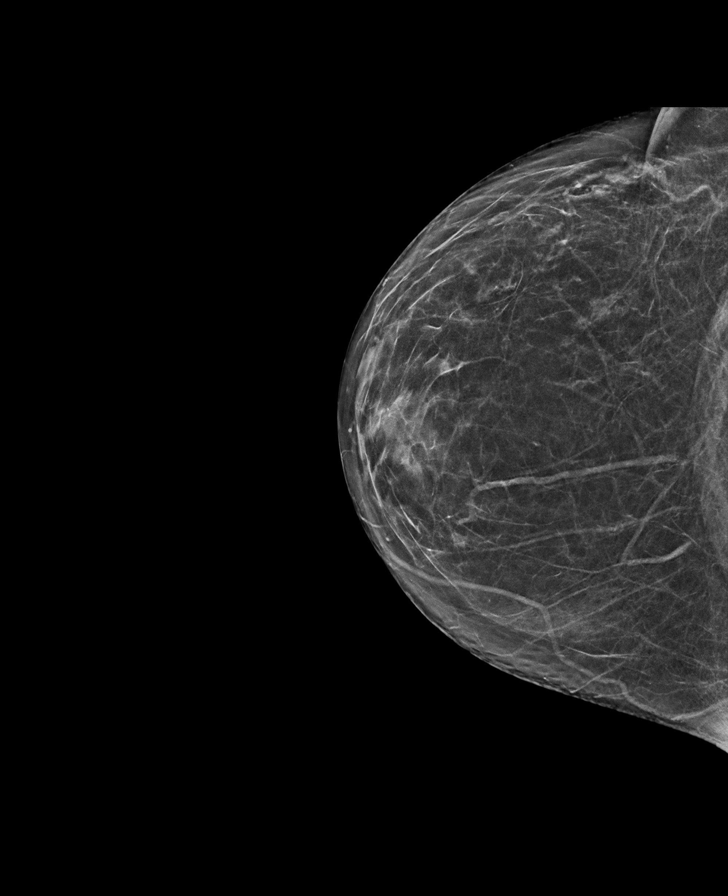

[L CC synth-2D]
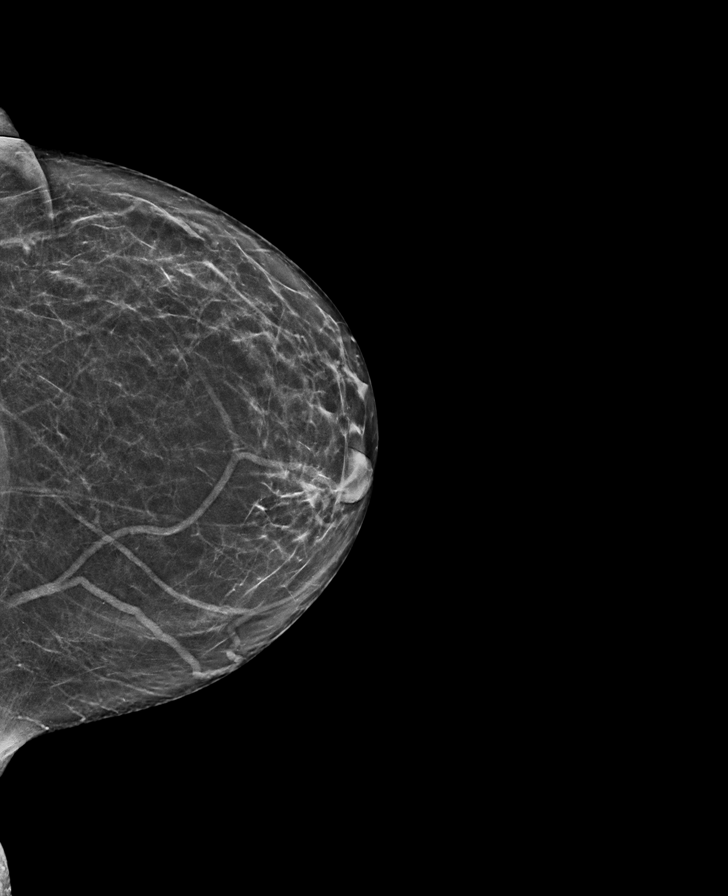

[R CC synth-2D (3 of 3)]
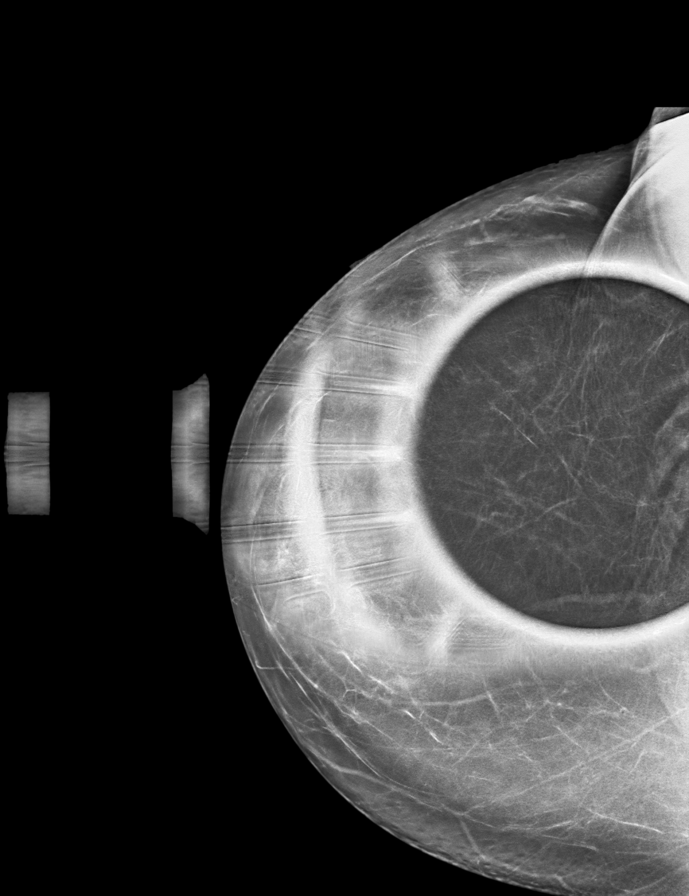

[R MLO tomo · tomo slice 51/74.0]
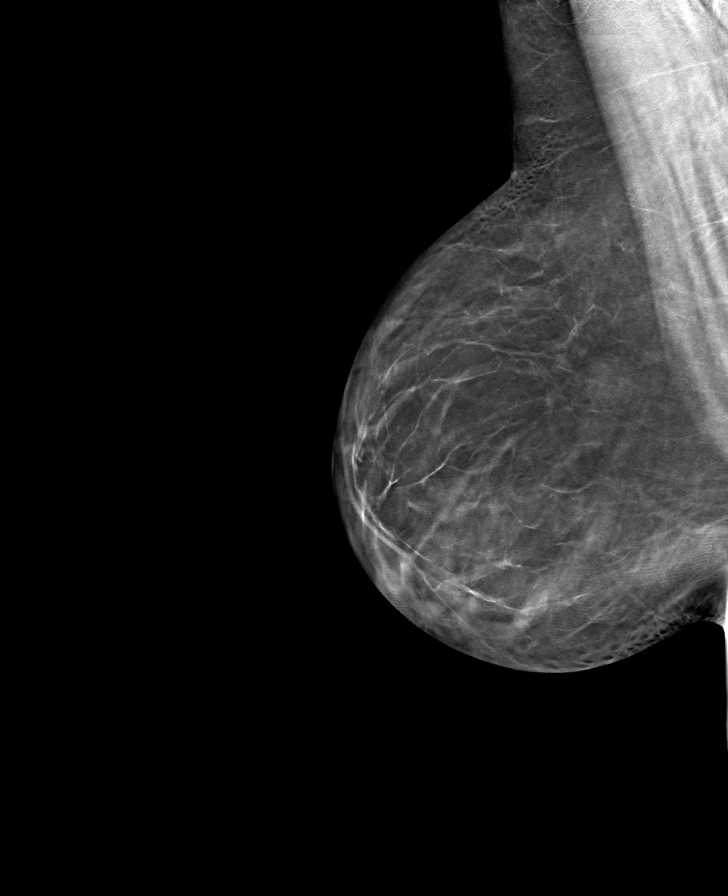

[8 of 40 positions shown; findings below may reference images not displayed]

ACR Breast Density Category b: There are scattered areas of
fibroglandular density.
FINDINGS: Asymmetry in the LATERAL portion of the RIGHT breast is stable
compared with multiple prior studies. No new or suspicious findings.
IMPRESSION: Long-term stability of asymmetry in the RIGHT breast, consistent
with benign fibroglandular tissue. No mammographic evidence for
malignancy.

RECOMMENDATION:
Screening mammogram in one year.(Code:[YI])

I have discussed the findings and recommendations with the patient.
If applicable, a reminder letter will be sent to the patient
regarding the next appointment.

BI-RADS CATEGORY  1: Negative.

## 2022-03-15 ENCOUNTER — Other Ambulatory Visit: Payer: Self-pay

## 2022-03-15 MED ORDER — URSODIOL 250 MG PO TABS: ORAL_TABLET | ORAL | 5 refills | Status: DC

## 2022-03-15 MED ORDER — URSODIOL 250 MG PO TABS: ORAL_TABLET | ORAL | 4 refills | Status: DC

## 2022-05-31 ENCOUNTER — Ambulatory Visit: Payer: 59 | Admitting: Oncology

## 2022-05-31 ENCOUNTER — Other Ambulatory Visit: Payer: 59

## 2022-06-02 NOTE — Progress Notes (Signed)
[facility]  [address] [place],  [place]  [address] [phone]  Clinic Day:  06/03/2022  Referring physician: Nicoletta Dress, MD  HISTORY OF PRESENT ILLNESS:  The patient is a 51 y.o. female with iron deficiency anemia secondary to her previous gastric bypass surgery.  She comes in today to reassess her iron and hemoglobin levels.  Since her last visit the patient has been doing well.                                                                                                                                                                                                                           She continues to deny having any overt forms of blood loss since her last visit,      PHYSICAL EXAM:  Blood pressure (!) 187/89, pulse 66, temperature 98 F (36.7 C), temperature source Oral, resp. rate 16, height '5\' 6"'$  (1.676 m), weight 183 lb 12.8 oz (83.4 kg), SpO2 98 %. Wt Readings from Last 3 Encounters:  06/03/22 183 lb 12.8 oz (83.4 kg)  11/29/21 182 lb 1.6 oz (82.6 kg)  07/15/21 191 lb 8 oz (86.9 kg)   Body mass index is 29.67 kg/m. Performance status (ECOG): 0 - Asymptomatic Physical Exam Constitutional:      Appearance: Normal appearance. She is not ill-appearing.  HENT:     Mouth/Throat:     Mouth: Mucous membranes are moist.     Pharynx: Oropharynx is clear. No oropharyngeal exudate or posterior oropharyngeal erythema.  Cardiovascular:     Rate and Rhythm: Normal rate and regular rhythm.     Heart sounds: No murmur heard. No friction rub. No gallop.   Pulmonary:     Effort: Pulmonary effort is normal. No respiratory distress.     Breath sounds: Normal breath sounds. No wheezing, rhonchi or rales.  Chest:  Breasts:     Right: No axillary adenopathy or supraclavicular adenopathy.     Left: No axillary adenopathy or supraclavicular adenopathy.   Abdominal:     General: Bowel sounds are normal. There is no distension.      Palpations: Abdomen is soft. There is no mass.     Tenderness: There is no abdominal tenderness.  Musculoskeletal:        General: No swelling.     Right lower leg: No edema.     Left lower leg: No edema.  Lymphadenopathy:     Cervical: No cervical  adenopathy.     Upper Body:     Right upper body: No supraclavicular or axillary adenopathy.     Left upper body: No supraclavicular or axillary adenopathy.     Lower Body: No right inguinal adenopathy. No left inguinal adenopathy.  Skin:    General: Skin is warm.     Coloration: Skin is not jaundiced.     Findings: No lesion or rash.  Neurological:     General: No focal deficit present.     Mental Status: She is alert and oriented to person, place, and time. Mental status is at baseline.     Cranial Nerves: Cranial nerves are intact.  Psychiatric:        Mood and Affect: Mood normal.        Behavior: Behavior normal.        Thought Content: Thought content normal.     LABS:      Latest Ref Rng & Units 06/03/2022   12:00 AM 11/29/2021   12:00 AM 05/31/2021   12:00 AM  CBC  WBC  8.4     9.1     7.5      Hemoglobin 12.0 - 16.0 13.0     13.1     12.1      Hematocrit 36 - 46 39     40     36      Platelets 150 - 400 K/uL 262     303     257         This result is from an external source.    Latest Reference Range & Units 06/03/22 15:47  Iron 28 - 170 ug/dL 63  UIBC ug/dL 379  TIBC 250 - 450 ug/dL 442  Saturation Ratios 10.4 - 31.8 % 14  Ferritin 11 - 307 ng/mL 118  Folate >5.9 ng/mL 5.2 (L)  Vitamin B12 180 - 914 pg/mL 636  (L): Data is abnormally low  ASSESSMENT & PLAN:  A 51 y.o. female with iron deficiency anemia secondary to her previous gastric bypass surgery.  I am pleased as her hemoglobin is essentially unchanged versus her last visit.  Her iron parameters also remain fine. Her vitamin B12 level is also fine.  However, her folic acid has fallen back to a lower level again.  Based upon this, she will need to restart  taking folate at least 800 mcg daily.  Despite this, she is doing well.  I will see her back in 6 months for repeat clinical assessment.   The patient understands all the plans discussed today and is in agreement with them.  Delicia Berens Macarthur Critchley, MD

## 2022-06-03 ENCOUNTER — Inpatient Hospital Stay: Payer: 59 | Admitting: Oncology

## 2022-06-03 ENCOUNTER — Other Ambulatory Visit: Payer: Self-pay | Admitting: Oncology

## 2022-06-03 ENCOUNTER — Encounter: Payer: Self-pay | Admitting: Oncology

## 2022-06-03 ENCOUNTER — Telehealth: Payer: Self-pay | Admitting: Oncology

## 2022-06-03 ENCOUNTER — Inpatient Hospital Stay: Payer: 59 | Attending: Oncology

## 2022-06-03 VITALS — BP 187/89 | HR 66 | Temp 98.0°F | Resp 16 | Ht 66.0 in | Wt 183.8 lb

## 2022-06-03 DIAGNOSIS — E538 Deficiency of other specified B group vitamins: Secondary | ICD-10-CM

## 2022-06-03 DIAGNOSIS — D508 Other iron deficiency anemias: Secondary | ICD-10-CM | POA: Diagnosis present

## 2022-06-03 DIAGNOSIS — Z9884 Bariatric surgery status: Secondary | ICD-10-CM | POA: Insufficient documentation

## 2022-06-03 LAB — CBC AND DIFFERENTIAL
HCT: 39 (ref 36–46)
Hemoglobin: 13 (ref 12.0–16.0)
Neutrophils Absolute: 5.46
Platelets: 262 10*3/uL (ref 150–400)
WBC: 8.4

## 2022-06-03 LAB — CBC: RBC: 4.34 (ref 3.87–5.11)

## 2022-06-03 LAB — IRON AND TIBC
Iron: 63 ug/dL (ref 28–170)
Saturation Ratios: 14 % (ref 10.4–31.8)
TIBC: 442 ug/dL (ref 250–450)
UIBC: 379 ug/dL

## 2022-06-03 LAB — FERRITIN: Ferritin: 118 ng/mL (ref 11–307)

## 2022-06-03 LAB — FOLATE: Folate: 5.2 ng/mL — ABNORMAL LOW (ref >5.9)

## 2022-06-03 LAB — VITAMIN B12: Vitamin B-12: 636 pg/mL (ref 180–914)

## 2022-06-03 NOTE — Telephone Encounter (Signed)
06/03/22 Next appt scheduled and confirmed with patient

## 2022-06-06 ENCOUNTER — Telehealth: Payer: Self-pay

## 2022-06-06 NOTE — Telephone Encounter (Signed)
Pt req results of iron studies from last week. Dr Bobby Rumpf reviewed labs and states the iron levels are stable. He recommends that she take folate 868mg po qd, as her folate level was down to 5.2

## 2022-06-12 ENCOUNTER — Other Ambulatory Visit: Payer: Self-pay | Admitting: Oncology

## 2022-06-12 ENCOUNTER — Encounter: Payer: Self-pay | Admitting: Oncology

## 2022-06-12 DIAGNOSIS — E538 Deficiency of other specified B group vitamins: Secondary | ICD-10-CM

## 2022-07-18 ENCOUNTER — Other Ambulatory Visit: Payer: Self-pay

## 2022-07-18 ENCOUNTER — Telehealth: Payer: Self-pay

## 2022-07-18 DIAGNOSIS — K869 Disease of pancreas, unspecified: Secondary | ICD-10-CM

## 2022-07-18 DIAGNOSIS — R935 Abnormal findings on diagnostic imaging of other abdominal regions, including retroperitoneum: Secondary | ICD-10-CM

## 2022-07-18 NOTE — Telephone Encounter (Signed)
-----   Message from Gillermina Hu, RN sent at 08/02/2021  3:57 PM EST ----- Regarding: Repeat MRI Angela Denmark, MD  Gillermina Hu, RN Please inform the patient.  Gr8 news  MRCP- 6 mm side branch IPMN, renal lesions are small cysts  Radiology recommends repeat MRI in 1 year.  Send report to family physician   08/02/2021

## 2022-07-18 NOTE — Telephone Encounter (Signed)
Reminder received in EPIC: Pt was notified of Dr . Lyndel Safe recommendation to repeat MRI for 1 year:  MRI was ordered  and staff message sent to scheduling team to to schedule MRI: Pt made aware: Pt notified that if they have not notified her in 1 week to please call radiology [phone] to schedule: Pt verbalized understanding with all questions answered.

## 2022-08-02 ENCOUNTER — Ambulatory Visit (HOSPITAL_COMMUNITY)
Admission: RE | Admit: 2022-08-02 | Discharge: 2022-08-02 | Disposition: A | Discharge status: Home / Self Care | Payer: 59 | Source: Ambulatory Visit | Attending: Gastroenterology | Admitting: Gastroenterology

## 2022-08-02 ENCOUNTER — Other Ambulatory Visit: Payer: Self-pay | Admitting: Gastroenterology

## 2022-08-02 DIAGNOSIS — K869 Disease of pancreas, unspecified: Secondary | ICD-10-CM

## 2022-08-02 DIAGNOSIS — R935 Abnormal findings on diagnostic imaging of other abdominal regions, including retroperitoneum: Secondary | ICD-10-CM

## 2022-08-02 MED ORDER — GADOBUTROL 1 MMOL/ML IV SOLN
8.0000 mL | Freq: Once | INTRAVENOUS | Status: AC | PRN
  Administered 2022-08-02: 8 mL via INTRAVENOUS

## 2022-11-18 LAB — LAB REPORT - SCANNED: EGFR (Non-African Amer.): 60

## 2022-11-21 ENCOUNTER — Telehealth: Payer: Self-pay

## 2022-11-21 ENCOUNTER — Telehealth: Payer: Self-pay | Admitting: Gastroenterology

## 2022-11-21 NOTE — Telephone Encounter (Signed)
Pt stated that she had labs done last week and her Liver enzymes are extremely Elevated. Pt stated that her AST is 120 and her ALT is 215: Pt stated that she had labs done at [facility] on Friday. Fax sent to [facility] requesting lab results.  Pt was scheduled for an office visit on 11/22/2022 at 10:20 AM . Pt made aware. Address Provided.  Pt verbalized understanding with all questions answered.

## 2022-11-21 NOTE — Telephone Encounter (Signed)
Patient is calling regarding recent labs states her doctor had sent them over and she is wishing to speak with a nurse. Please advise

## 2022-11-22 ENCOUNTER — Ambulatory Visit: Payer: 59 | Admitting: Cardiology

## 2022-11-22 ENCOUNTER — Encounter: Payer: Self-pay | Admitting: Cardiology

## 2022-11-22 ENCOUNTER — Encounter: Payer: Self-pay | Admitting: Gastroenterology

## 2022-11-22 ENCOUNTER — Encounter: Payer: Self-pay | Admitting: Oncology

## 2022-11-22 ENCOUNTER — Ambulatory Visit: Payer: 59 | Admitting: Gastroenterology

## 2022-11-22 VITALS — BP 144/95 | HR 82 | Ht 66.0 in | Wt 174.6 lb

## 2022-11-22 VITALS — BP 128/78 | HR 68 | Ht 66.0 in | Wt 174.6 lb

## 2022-11-22 DIAGNOSIS — R0609 Other forms of dyspnea: Secondary | ICD-10-CM

## 2022-11-22 DIAGNOSIS — Z8639 Personal history of other endocrine, nutritional and metabolic disease: Secondary | ICD-10-CM

## 2022-11-22 DIAGNOSIS — R935 Abnormal findings on diagnostic imaging of other abdominal regions, including retroperitoneum: Secondary | ICD-10-CM | POA: Diagnosis not present

## 2022-11-22 DIAGNOSIS — K743 Primary biliary cirrhosis: Secondary | ICD-10-CM | POA: Diagnosis not present

## 2022-11-22 DIAGNOSIS — R072 Precordial pain: Secondary | ICD-10-CM

## 2022-11-22 DIAGNOSIS — I10 Essential (primary) hypertension: Secondary | ICD-10-CM

## 2022-11-22 MED ORDER — URSODIOL 250 MG PO TABS: ORAL_TABLET | ORAL | 6 refills | Status: DC

## 2022-11-22 MED ORDER — METOPROLOL SUCCINATE ER 25 MG PO TB24: 25.0000 mg | ORAL_TABLET | Freq: Every morning | ORAL | 3 refills | Status: AC

## 2022-11-22 MED ORDER — VALSARTAN 80 MG PO TABS: 80.0000 mg | ORAL_TABLET | Freq: Every evening | ORAL | 3 refills | Status: AC

## 2022-11-22 NOTE — Progress Notes (Addendum)
Chief Complaint: Abnormal LFTs  Referring Provider:  Paulina Fusi, MD      ASSESSMENT AND PLAN;    1. PBC Dx on Liver Bx 03/2020, elevated AMA. Nl Alk Phos now. Has associated fatty liver. 2. H/O morbid obesity s/p RYGB 2008.  No obvious liver cirrhosis. Nl Plts, Alb 3. Sidebranch pancreatic IPMN (stable). Rpt MRI 07/2024 4. Neg screening colon 2017 by Dr. Charm Barges. Rpt in 10 yrs. 5. IDA d/t gastric bypass on periodic parenteral iron (Dr Melvyn Neth).  Has assoc B12 and folate deficiency.   Plan:  -Restart Urso 500 mg p.o. twice daily 60, 11 RF -USE.  -FU in 12 weeks. At FU recheck LFTs -Vit E 400 QD -Encouraged her to lose weight. -Proceed with cardiac workup   Addendum -LFTs trending down ever since she has started URSO -Records from Dr. Maree Erie office: 11/18/2022 AST 120, ALT 215, alk phos 156, albumin 4.0 -Most recent labs from 11/25/2022 AST 94 ALT 45, alk phos 136 -[facility] negative for cirrhosis -She is undergoing cardiac workup and has follow-up visit  HPI:    Angela Miles is a 52 y.o. female  With H/O RYGB 2008 (with resultant 129lb wt loss), anxiety/depression, HTN, mild obesity With PBC/NASH  For follow-up visit.  Was doing very well on Urso.  LFTs had normalized  Stopped Urso Dec 2023 on her own.  Started having precordial chest pains.  Seen by cardiologist today as well.  She is scheduled to have CT coronary angio in coming days.  When liver function tests were checked, had significant increased AST/ALT.no significant itching.  She has resumed Urso.  She does feel better.  No alcohol.  No H/O itching, skin lesions, easy bruisability, intake of OTC meds including diet pills, herbal medications, anabolic steroids or Tylenol. There is no H/O blood transfusions, IVDA or FH of liver disease. No jaundice, dark urine or pale stools. No alcohol abuse.  Has been compliant with B12.       Previous GI work-up:  Liver bx 03/2020 revealed PBC with grade 1-2/4  fibrosis.  Neg colonoscopy by Dr. Charm Barges in 2017.  Repeat in 10 years  MRCP 08/02/2022 1. Stable 5 mm cystic lesion in the pancreatic body without suspicious MRI features, likely reflecting a side branch IPMN. Recommend follow up pre and post contrast MRI/MRCP in 2 years. 2. Stable benign Bosniak classification 1 and 2 renal cysts require no independent imaging follow-up.  MRI liver with and without contrast 03/2020 1. No acute findings within the abdomen. 2. Previous cholecystectomy. 3. Small 6 mm cystic structure associated with the neck of pancreas is identified. This is technically too small to further characterize. Follow-up imaging with repeat MRI in 12 months without and with contrast material recommended. 4. Bilateral subcentimeter T2 hyperintense kidney lesions are technically too small to reliably characterize. The largest is in the anterior cortex of the inferior pole of the right kidney measuring 8 mm. This may be mildly complex containing a thin internal area of septation. Attention on follow-up MRI is recommended.  CT AP June 2021 showing significant heterogenous enhancement pattern throughout the liver which could be related to intrinsic liver disease.  No definite liver cirrhosis.  From previous note: referred to GI clinic for abn LFTs: AST 56, ALT 106, alk phos 132 with normal CBC hemoglobin 13.0, platelets 312.  Note that iron studies were normal.    Wt Readings from Last 3 Encounters:  11/22/22 174 lb 9.6 oz (79.2 kg)  11/22/22 174  lb 9.6 oz (79.2 kg)  06/03/22 183 lb 12.8 oz (83.4 kg)      Latest Ref Rng & Units 07/20/2021    4:42 PM 03/10/2021   12:00 AM 09/25/2020    2:05 PM  Hepatic Function  Total Protein 6.0 - 8.5 g/dL 7.0   7.4   Albumin 3.8 - 4.8 g/dL 4.2  3.9  3.8   AST 0 - 40 IU/L 28  26  28    ALT 0 - 32 IU/L 53  40  63   Alk Phosphatase 44 - 121 IU/L 195  89  129   Total Bilirubin 0.0 - 1.2 mg/dL 0.3   0.5      SH-works for [employer]  Has very high-end clients like Rudene Christians Past Medical History:  Diagnosis Date   Anemia    Asthma    bronchitis   Cancer     Past Surgical History:  Procedure Laterality Date   BREAST EXCISIONAL BIOPSY Right    2014   BREAST LUMPECTOMY WITH NEEDLE LOCALIZATION Right 02/26/2013   Procedure: BREAST LUMPECTOMY WITH NEEDLE LOCALIZATION;  Surgeon: Clovis Pu. Cornett, MD;  Location: [facility];  Service: General;  Laterality: Right;  NEEDLE LOCALIZATION AT [facility] 7;30    CERVICAL BIOPSY     CESAREAN SECTION  02,05   CHOLECYSTECTOMY     COLONOSCOPY     Dr Rayfield Citizen 2-14 around 2017   DILATION AND CURETTAGE OF UTERUS  2009   uterine ablation   ESOPHAGOGASTRODUODENOSCOPY     around 08   GASTRIC BYPASS  08    Family History  Problem Relation Age of Onset   Hypertension Mother    Colon polyps Father    Dementia Father    Hypertension Father    Colon cancer Paternal Kateri Mc        has a colestomy bag now. Around 60's   Esophageal cancer Neg Hx     Social History   Tobacco Use   Smoking status: Former    Types: Cigarettes    Quit date: 02/11/2009    Years since quitting: 13.7   Smokeless tobacco: Never  Vaping Use   Vaping Use: Never used  Substance Use Topics   Alcohol use: Not Currently    Comment: rare   Drug use: No    Current Outpatient Medications  Medication Sig Dispense Refill   Folic Acid (FOLATE PO) Take 800 mcg by mouth daily.     magnesium 30 MG tablet Take 420 mg by mouth 2 (two) times daily.     metoprolol succinate (TOPROL XL) 25 MG 24 hr tablet Take 1 tablet (25 mg total) by mouth in the morning. 90 tablet 3   milk thistle 175 MG tablet Take 2,000 mg by mouth daily.     ursodiol (ACTIGALL) 250 MG tablet TAKE 2 TABLET BY MOUTH EVERY MORNING AND AT BEDTIME 120 tablet 4   valsartan (DIOVAN) 80 MG tablet Take 1 tablet (80 mg total) by mouth at bedtime. 90 tablet 3   vitamin E 1000 UNIT capsule Take 1,000 Units by mouth  daily.     No current facility-administered medications for this visit.    Allergies  Allergen Reactions   Penicillins Nausea Only    Other reaction(s): Not available   Nsaids Palpitations   Sulfa Antibiotics Palpitations    Review of Systems:  neg     Physical Exam:    BP 128/78   Pulse 68  Ht 5\' 6"  (1.676 m)   Wt 174 lb 9.6 oz (79.2 kg)   SpO2 99%   BMI 28.18 kg/m  Wt Readings from Last 3 Encounters:  11/22/22 174 lb 9.6 oz (79.2 kg)  11/22/22 174 lb 9.6 oz (79.2 kg)  06/03/22 183 lb 12.8 oz (83.4 kg)   Constitutional:  Well-developed, in no acute distress. Psychiatric: Normal mood and affect. Behavior is normal. HEENT: Pupils normal.  Conjunctivae are normal. No scleral icterus. Cardiovascular: Normal rate, regular rhythm. No edema Pulmonary/chest: Effort normal and breath sounds normal. No wheezing, rales or rhonchi. Abdominal: Soft, nondistended. Nontender. Bowel sounds active throughout. There are no masses palpable. Has hepatomegaly -liver palpated 4 cm below the costal margin. Rectal:  defered Neurological: Alert and oriented to person place and time. Skin: Skin is warm and dry. No rashes noted.  Data Reviewed: I have personally reviewed following labs and imaging studies  CBC:    Latest Ref Rng & Units 06/03/2022   12:00 AM 11/29/2021   12:00 AM 05/31/2021   12:00 AM  CBC  WBC  8.4     9.1     7.5      Hemoglobin 12.0 - 16.0 13.0     13.1     12.1      Hematocrit 36 - 46 39     40     36      Platelets 150 - 400 K/uL 262     303     257         This result is from an external source.    CMP:    Latest Ref Rng & Units 07/20/2021    4:42 PM 03/10/2021   12:00 AM 09/25/2020    2:05 PM  CMP  Glucose 70 - 99 mg/dL 87   540   BUN 6 - 24 mg/dL 15  13  16    Creatinine 0.57 - 1.00 mg/dL 9.81  0.5  1.91   Sodium 134 - 144 mmol/L 139  137  137   Potassium 3.5 - 5.2 mmol/L 3.9  3.7  3.6   Chloride 96 - 106 mmol/L 101  102  102   CO2 20 - 29 mmol/L 26   27  25    Calcium 8.7 - 10.2 mg/dL 9.1  8.3  8.7   Total Protein 6.0 - 8.5 g/dL 7.0   7.4   Total Bilirubin 0.0 - 1.2 mg/dL 0.3   0.5   Alkaline Phos 44 - 121 IU/L 195  89  129   AST 0 - 40 IU/L 28  26  28    ALT 0 - 32 IU/L 53  40  63      Edman Circle, MD 11/22/2022, 10:47 AM  Cc: Paulina Fusi, MD

## 2022-11-22 NOTE — Progress Notes (Signed)
ID:  LEWIS GLERUM, DOB [birth year], MRN [id number]  PCP:  Paulina Fusi, MD  Cardiologist:  Tessa Lerner, DO, [facility] (established care 11/22/22) Former Cardiology Providers: None  REASON FOR CONSULT: Chest Pain  REQUESTING PHYSICIAN:  Paulina Fusi, MD [address] [place],  [place] [address]  Chief Complaint  Patient presents with   Chest Pain   New Patient (Initial Visit)    HPI  Angela Miles is a 52 y.o.  female who presents to the clinic for evaluation of chest pain at the request of Paulina Fusi, MD.  Her past medical history and cardiovascular risk factors include: Hypertension, biliary cirrhosis, anemia, roux en y gastric bypass (2008), cholecystectomy.    Referred to the practice for evaluation of precordial pain.  Chest pain started about a week ago with notable increase in HR and BP. She notes that her pain is on the left side, with radiation in her left shoulder blade, and neck. She states the pain can last minutes to hours, and describes the pain to be burning and dull. Her symptoms are alleviated at rest, and at times worsens with exertion. She does admit to increased of shortness of breath when walking short distances going up flights of stairs. She has a family hx of HTN and grandfather had a CABGx4 and died of MI at age of 32.   She has history of gastric bypass and has been consuming more carnivore diet (high end diets and needs) which may be contributing to her symptoms.  She also has an underlying diagnosis of biliary cirrhosis and has an appointment with GI later today.  She denies prior history of coronary artery disease, myocardial infarction, congestive heart failure, deep venous thrombosis, pulmonary embolism, stroke, transient ischemic attack.She admits to 2 cups of coffee a day, no smoking and no alcohol since 2021.   FUNCTIONAL STATUS: Patient is able to exercise a day walking on the treadmill.     ALLERGIES: Allergies  Allergen Reactions   Penicillins Nausea Only    Other reaction(s): Not available   Nsaids Palpitations   Sulfa Antibiotics Palpitations    MEDICATION LIST PRIOR TO VISIT: Current Meds  Medication Sig   Folic Acid (FOLATE PO) Take 800 mcg by mouth daily.   magnesium 30 MG tablet Take 420 mg by mouth 2 (two) times daily.   metoprolol succinate (TOPROL XL) 25 MG 24 hr tablet Take 1 tablet (25 mg total) by mouth in the morning.   milk thistle 175 MG tablet Take 2,000 mg by mouth daily.   valsartan (DIOVAN) 80 MG tablet Take 1 tablet (80 mg total) by mouth at bedtime.   vitamin E 1000 UNIT capsule Take 1,000 Units by mouth daily.   [DISCONTINUED] ursodiol (ACTIGALL) 250 MG tablet TAKE 2 TABLET BY MOUTH EVERY MORNING AND AT BEDTIME   [DISCONTINUED] valsartan-hydrochlorothiazide (DIOVAN-HCT) 80-12.5 MG tablet Take 0.5 tablets by mouth daily.     PAST MEDICAL HISTORY: Past Medical History:  Diagnosis Date   Anemia    Asthma    bronchitis   Cancer     PAST SURGICAL HISTORY: Past Surgical History:  Procedure Laterality Date   BREAST EXCISIONAL BIOPSY Right    2014   BREAST LUMPECTOMY WITH NEEDLE LOCALIZATION Right 02/26/2013   Procedure: BREAST LUMPECTOMY WITH NEEDLE LOCALIZATION;  Surgeon: Maisie Fus A. Cornett, MD;  Location: [facility];  Service: General;  Laterality: Right;  NEEDLE LOCALIZATION AT [facility] 7;30  CERVICAL BIOPSY     CESAREAN SECTION  02,05   CHOLECYSTECTOMY     COLONOSCOPY     Dr Rayfield Citizen 2-14 around 2017   DILATION AND CURETTAGE OF UTERUS  2009   uterine ablation   ESOPHAGOGASTRODUODENOSCOPY     around 08   GASTRIC BYPASS  08    FAMILY HISTORY: The patient family history includes Colon cancer in her paternal uncle; Colon polyps in her father; Dementia in her father; Hypertension in her father and mother.  SOCIAL HISTORY:  The patient  reports that she quit smoking about 13 years ago. Her smoking use  included cigarettes. She has never used smokeless tobacco. She reports that she does not currently use alcohol. She reports that she does not use drugs.  REVIEW OF SYSTEMS: Review of Systems  Constitutional: Negative.  Cardiovascular:  Positive for chest pain, dyspnea on exertion and palpitations. Negative for claudication, irregular heartbeat, leg swelling, near-syncope, orthopnea, paroxysmal nocturnal dyspnea and syncope.  Respiratory: Negative.  Negative for shortness of breath.   Hematologic/Lymphatic: Negative for bleeding problem.  Musculoskeletal:  Negative for muscle cramps and myalgias.  Gastrointestinal:  Positive for bloating, abdominal pain and heartburn.  Neurological:  Negative for dizziness and light-headedness.    PHYSICAL EXAM:    11/22/2022   10:21 AM 11/22/2022    8:55 AM 11/22/2022    8:44 AM  Vitals with BMI  Height 5\' 6"   5\' 6"   Weight 174 lbs 10 oz  174 lbs 10 oz  BMI 28.19  28.19  Systolic 128 144 324  Diastolic 78 95 89  Pulse 68 82 84    Physical Exam  Constitutional: She appears healthy. No distress.  Age appropriate, hemodynamically stable.   Neck: No JVD present.  Cardiovascular: Normal rate, regular rhythm, S1 normal, S2 normal, normal heart sounds, intact distal pulses and normal pulses. Exam reveals no gallop, no S3 and no S4.  No murmur heard. Pulmonary/Chest: Effort normal and breath sounds normal. No stridor. She has no wheezes. She has no rales.  Abdominal: Soft. Bowel sounds are normal. She exhibits no distension. There is no abdominal tenderness.  Musculoskeletal:        General: No edema. Normal range of motion.     Cervical back: Neck supple.  Neurological: She is alert and oriented to person, place, and time. She has intact cranial nerves (2-12).  Skin: Skin is warm and moist.   CARDIAC DATABASE: EKG: November 22, 2022: Sinus rhythm, 72 bpm, normal axis, without underlying ischemia injury pattern.  Echocardiogram: No results found for this  or any previous visit from the past 1095 days.    Stress Testing: No results found for this or any previous visit from the past 1095 days.   Heart Catheterization: None  LABORATORY DATA:    Latest Ref Rng & Units 06/03/2022   12:00 AM 11/29/2021   12:00 AM 05/31/2021   12:00 AM  CBC  WBC  8.4     9.1     7.5      Hemoglobin 12.0 - 16.0 13.0     13.1     12.1      Hematocrit 36 - 46 39     40     36      Platelets 150 - 400 K/uL 262     303     257         This result is from an external source.       Latest Ref  Rng & Units 07/20/2021    4:42 PM 03/10/2021   12:00 AM 09/25/2020    2:05 PM  CMP  Glucose 70 - 99 mg/dL 87   960   BUN 6 - 24 mg/dL 15  13  16    Creatinine 0.57 - 1.00 mg/dL 4.54  0.5  0.98   Sodium 134 - 144 mmol/L 139  137  137   Potassium 3.5 - 5.2 mmol/L 3.9  3.7  3.6   Chloride 96 - 106 mmol/L 101  102  102   CO2 20 - 29 mmol/L 26  27  25    Calcium 8.7 - 10.2 mg/dL 9.1  8.3  8.7   Total Protein 6.0 - 8.5 g/dL 7.0   7.4   Total Bilirubin 0.0 - 1.2 mg/dL 0.3   0.5   Alkaline Phos 44 - 121 IU/L 195  89  129   AST 0 - 40 IU/L 28  26  28    ALT 0 - 32 IU/L 53  40  63     Lipid Panel  No results found for: "CHOL", "TRIG", "HDL", "CHOLHDL", "VLDL", "LDLCALC", "LDLDIRECT", "LABVLDL"  No components found for: "NTPROBNP" No results for input(s): "PROBNP" in the last 8760 hours. No results for input(s): "TSH" in the last 8760 hours.  BMP No results for input(s): "NA", "K", "CL", "CO2", "GLUCOSE", "BUN", "CREATININE", "CALCIUM", "GFRNONAA", "GFRAA" in the last 8760 hours.  HEMOGLOBIN A1C No results found for: "HGBA1C", "MPG"  External Labs 11/18/2022 - [facility]  Glucose 85, BUN/Cr 13/0.40. EGFR >60. Na/K 135/4.0. H/H 12.6/37.6. MCV 89. Platelets 279 TSH 0.84/T3 3.46/ T4 1.44   IMPRESSION:    ICD-10-CM   1. Precordial pain  R07.2 EKG 12-Lead    PCV ECHOCARDIOGRAM COMPLETE    CT CORONARY MORPH W/CTA COR W/SCORE W/CA W/CM &/OR WO/CM    metoprolol  succinate (TOPROL XL) 25 MG 24 hr tablet    Lipid Profile    Direct LDL    CMP14+EGFR    2. Dyspnea on exertion  R06.09     3. Benign hypertension  I10 valsartan (DIOVAN) 80 MG tablet    CMP14+EGFR       RECOMMENDATIONS:  HPI  Inaya C Vanacker is a 52 y.o.  female whose  past medical history and cardiovascular risk factors include: Hypertension, biliary cirrhosis, anemia, roux en y gastric bypass (2008), cholecystectomy.    Precordial pain EKG personally reviewed with no acute injury pattern  Outside labs personally reviewed with negative Troponins  Precordial discomfort has both cardiac and noncardiac features; however, given her history of GI comorbidities noncardiac causes cannot be entirely ruled out.  Agree with following up with GI later today. Recommend undergoing coronary CTA to evaluate for coronary calcium score and obstructive disease. Patient concerns regards to cost and the alternative would be coronary calcium score and exercise nuclear stress test.  Risks, benefits, alternatives, limitations discussed. If it is cost effective she will proceed with coronary CTA otherwise we will have a coronary calcium score plus exercise nuclear stress test. Ordered Lipid profile with education on fasting prior to obtaining labs. Start Toprol-XL 25 mg p.o. daily. Educated on seeking medical attention sooner by going to the closest ER via EMS if the symptoms increase in intensity, frequency, duration, or has typical chest pain as discussed in the office.  Patient verbalized understanding.  Dyspnea on exertion Ischemic workup as discussed above. Other contributing factors could be uncontrolled hypertension. Clinically overall physical endurance has reduced-he was noted to  be more dyspneic with ambulation and walking at a slower speed on her treadmill.   Benign hypertension Office blood pressures are now well-controlled. She also notes that she has been cutting her valsartan/HCTZ in  half. She just restarted taking this medication on 11/19/2022. I educated that cutting her pills in half with combination medications does not ensure how much medication she is obtaining.  Stop combination medication of valsartan/HCTZ 0.5 tabs per day Start Valsartan 80 mg PO Nightly Start Toprol XL 25 mg PO Daily Ordered kidney function test in one week after starting medication Re emphasized importance of low-salt diet. She is encouraged to keep a log of her blood pressures at home and to review with either myself or PCP at the next visit.  FINAL MEDICATION LIST END OF ENCOUNTER: Meds ordered this encounter  Medications   metoprolol succinate (TOPROL XL) 25 MG 24 hr tablet    Sig: Take 1 tablet (25 mg total) by mouth in the morning.    Dispense:  90 tablet    Refill:  3   valsartan (DIOVAN) 80 MG tablet    Sig: Take 1 tablet (80 mg total) by mouth at bedtime.    Dispense:  90 tablet    Refill:  3    Medications Discontinued During This Encounter  Medication Reason   celecoxib (CELEBREX) 200 MG capsule Completed Course   meloxicam (MOBIC) 15 MG tablet Completed Course   tiZANidine (ZANAFLEX) 4 MG tablet Completed Course   valsartan-hydrochlorothiazide (DIOVAN-HCT) 80-12.5 MG tablet      Current Outpatient Medications:    Folic Acid (FOLATE PO), Take 800 mcg by mouth daily., Disp: , Rfl:    magnesium 30 MG tablet, Take 420 mg by mouth 2 (two) times daily., Disp: , Rfl:    metoprolol succinate (TOPROL XL) 25 MG 24 hr tablet, Take 1 tablet (25 mg total) by mouth in the morning., Disp: 90 tablet, Rfl: 3   milk thistle 175 MG tablet, Take 2,000 mg by mouth daily., Disp: , Rfl:    valsartan (DIOVAN) 80 MG tablet, Take 1 tablet (80 mg total) by mouth at bedtime., Disp: 90 tablet, Rfl: 3   vitamin E 1000 UNIT capsule, Take 1,000 Units by mouth daily., Disp: , Rfl:    ursodiol (ACTIGALL) 250 MG tablet, TAKE 2 TABLET BY MOUTH EVERY MORNING AND AT BEDTIME, Disp: 120 tablet, Rfl: 6  Orders  Placed This Encounter  Procedures   CT CORONARY MORPH W/CTA COR W/SCORE W/CA W/CM &/OR WO/CM   Lipid Profile   Direct LDL   CMP14+EGFR   EKG 12-Lead   PCV ECHOCARDIOGRAM COMPLETE    There are no Patient Instructions on file for this visit.   --Continue cardiac medications as reconciled in final medication list. --Return in about 4 weeks (around 12/20/2022). or sooner if needed. --Continue follow-up with your primary care physician regarding the management of your other chronic comorbid conditions.  Patient's questions and concerns were addressed to her satisfaction. She voices understanding of the instructions provided during this encounter.   This note was created using a voice recognition software as a result there may be grammatical errors inadvertently enclosed that do not reflect the nature of this encounter. Every attempt is made to correct such errors.   Tessa LernerSunit Jaton Eilers, [place]DO, [facility]  Pager:  [phone] Office: [phone]

## 2022-11-22 NOTE — Patient Instructions (Addendum)
You have been scheduled for an appointment with Dr. Chales Abrahams on 02/22/23 at 11:20 AM . Please arrive 10 minutes early for your appointment.   You will be contacted by [facility] in the next 2 days to arrange an Ultrasound Elastography.  The number on your caller ID will be [phone], please answer when they call.  If you have not heard from them in 2 days please call [phone] to schedule.    We have sent the following medications to your pharmacy for you to pick up at your convenience: Ursodiol  Take Vitamin E 400 I U daily   _______________________________________________________  If your blood pressure at your visit was 140/90 or greater, please contact your primary care physician to follow up on this.  _______________________________________________________ If you are age 52 or younger, your body mass index should be between 19-25. Your Body mass index is 28.18 kg/m. If this is out of the aformentioned range listed, please consider follow up with your Primary Care Provider.   __________________________________________________________  The [facility] providers would like to encourage you to use [facility] to communicate with providers for non-urgent requests or questions.  Due to long hold times on the telephone, sending your provider a message by [facility] may be a faster and more efficient way to get a response.  Please allow 48 business hours for a response.  Please remember that this is for non-urgent requests.   Due to recent changes in healthcare laws, you may see the results of your imaging and laboratory studies on MyChart before your provider has had a chance to review them.  We understand that in some cases there may be results that are confusing or concerning to you. Not all laboratory results come back in the same time frame and the provider may be waiting for multiple results in order to interpret others.  Please give [place] 48 hours in order for your provider to  thoroughly review all the results before contacting the office for clarification of your results.    Thank you for choosing me and [facility].  Vito Cirigliano, D.O.

## 2022-11-23 ENCOUNTER — Ambulatory Visit: Payer: 59

## 2022-11-23 DIAGNOSIS — R072 Precordial pain: Secondary | ICD-10-CM

## 2022-11-25 ENCOUNTER — Other Ambulatory Visit: Payer: Self-pay | Admitting: Cardiology

## 2022-11-26 LAB — CMP14+EGFR
ALT: 94 IU/L — ABNORMAL HIGH (ref 0–32)
AST: 45 IU/L — ABNORMAL HIGH (ref 0–40)
Albumin/Globulin Ratio: 1.3 (ref 1.2–2.2)
Albumin: 3.9 g/dL (ref 3.8–4.9)
Alkaline Phosphatase: 136 IU/L — ABNORMAL HIGH (ref 44–121)
BUN/Creatinine Ratio: 28 — ABNORMAL HIGH (ref 9–23)
BUN: 16 mg/dL (ref 6–24)
Bilirubin Total: 0.7 mg/dL (ref 0.0–1.2)
CO2: 25 mmol/L (ref 20–29)
Calcium: 9.1 mg/dL (ref 8.7–10.2)
Chloride: 101 mmol/L (ref 96–106)
Creatinine, Ser: 0.58 mg/dL (ref 0.57–1.00)
Globulin, Total: 3.1 g/dL (ref 1.5–4.5)
Glucose: 92 mg/dL (ref 70–99)
Potassium: 4 mmol/L (ref 3.5–5.2)
Sodium: 138 mmol/L (ref 134–144)
Total Protein: 7 g/dL (ref 6.0–8.5)
eGFR: 109 mL/min/{1.73_m2} (ref >59)

## 2022-11-26 LAB — LIPID PANEL
Chol/HDL Ratio: 2.4 ratio (ref 0.0–4.4)
Cholesterol, Total: 151 mg/dL (ref 100–199)
HDL: 64 mg/dL (ref >39)
LDL Chol Calc (NIH): 71 mg/dL (ref 0–99)
Triglycerides: 88 mg/dL (ref 0–149)
VLDL Cholesterol Cal: 16 mg/dL (ref 5–40)

## 2022-11-26 LAB — LDL CHOLESTEROL, DIRECT: LDL Direct: 66 mg/dL (ref 0–99)

## 2022-11-28 ENCOUNTER — Telehealth (HOSPITAL_COMMUNITY): Payer: Self-pay | Admitting: Emergency Medicine

## 2022-11-28 NOTE — Telephone Encounter (Signed)
Reaching out to patient to offer assistance regarding upcoming cardiac imaging study; pt verbalizes understanding of appt date/time, parking situation and where to check in, pre-test NPO status and medications ordered, and verified current allergies; name and call back number provided for further questions should they arise Rockwell Alexandria RN Navigator Cardiac Imaging [facility] [phone] office [phone] cell   Arrival 900  Preferred arm = R for IVs 25mg  metoprolol 2 hr prior  Aware contrast/nitro

## 2022-11-29 ENCOUNTER — Telehealth: Payer: Self-pay

## 2022-11-29 ENCOUNTER — Ambulatory Visit (HOSPITAL_COMMUNITY)
Admission: RE | Admit: 2022-11-29 | Discharge: 2022-11-29 | Disposition: A | Discharge status: Home / Self Care | Payer: 59 | Source: Ambulatory Visit | Attending: Gastroenterology | Admitting: Gastroenterology

## 2022-11-29 ENCOUNTER — Other Ambulatory Visit: Payer: Self-pay

## 2022-11-29 ENCOUNTER — Ambulatory Visit (HOSPITAL_COMMUNITY)
Admission: RE | Admit: 2022-11-29 | Discharge: 2022-11-29 | Disposition: A | Discharge status: Home / Self Care | Payer: 59 | Source: Ambulatory Visit | Attending: Cardiology | Admitting: Cardiology

## 2022-11-29 ENCOUNTER — Other Ambulatory Visit: Payer: Self-pay | Admitting: Cardiology

## 2022-11-29 ENCOUNTER — Ambulatory Visit (HOSPITAL_COMMUNITY)
Admission: RE | Admit: 2022-11-29 | Discharge: 2022-11-29 | Disposition: A | Discharge status: Home / Self Care | Payer: 59 | Source: Ambulatory Visit

## 2022-11-29 DIAGNOSIS — Z8639 Personal history of other endocrine, nutritional and metabolic disease: Secondary | ICD-10-CM | POA: Diagnosis present

## 2022-11-29 DIAGNOSIS — R935 Abnormal findings on diagnostic imaging of other abdominal regions, including retroperitoneum: Secondary | ICD-10-CM | POA: Diagnosis present

## 2022-11-29 DIAGNOSIS — R931 Abnormal findings on diagnostic imaging of heart and coronary circulation: Secondary | ICD-10-CM | POA: Insufficient documentation

## 2022-11-29 DIAGNOSIS — K743 Primary biliary cirrhosis: Secondary | ICD-10-CM | POA: Insufficient documentation

## 2022-11-29 DIAGNOSIS — R072 Precordial pain: Secondary | ICD-10-CM

## 2022-11-29 DIAGNOSIS — I10 Essential (primary) hypertension: Secondary | ICD-10-CM

## 2022-11-29 MED ORDER — METOPROLOL TARTRATE 5 MG/5ML IV SOLN
INTRAVENOUS | Status: AC
  Filled 2022-11-29: qty 10

## 2022-11-29 MED ORDER — NITROGLYCERIN 0.4 MG SL SUBL
SUBLINGUAL_TABLET | SUBLINGUAL | Status: AC
  Filled 2022-11-29: qty 2

## 2022-11-29 MED ORDER — METOPROLOL TARTRATE 5 MG/5ML IV SOLN
10.0000 mg | Freq: Once | INTRAVENOUS | Status: AC
  Administered 2022-11-29: 10 mg via INTRAVENOUS

## 2022-11-29 MED ORDER — IOHEXOL 350 MG/ML SOLN
95.0000 mL | Freq: Once | INTRAVENOUS | Status: AC | PRN
  Administered 2022-11-29: 95 mL via INTRAVENOUS

## 2022-11-29 MED ORDER — NITROGLYCERIN 0.4 MG SL SUBL
0.8000 mg | SUBLINGUAL_TABLET | Freq: Once | SUBLINGUAL | Status: AC
  Administered 2022-11-29: 0.8 mg via SUBLINGUAL

## 2022-11-29 NOTE — Telephone Encounter (Signed)
Patient is wanting to know echo results. As she is leaving for [facility] tomorrow and was told you would either give her the ok or let her know it is unsafe. She also wanted me to let you know she had her cardiac CT this morning, she took metoprolol as instructed but when she was having the test done they could not get her HR down and had to give her an additional 10 mg before they could complete the imaging.

## 2022-11-29 NOTE — Progress Notes (Signed)
Called patient no answer could not leave a vm due to vm being too full

## 2022-11-29 NOTE — Progress Notes (Signed)
Gave patient results, she acknowledged understanding and had no further questions.

## 2022-12-02 NOTE — Telephone Encounter (Signed)
Spoke to patient over the phone 11/30/2022.  Results discussed.   Genevia Bouldin [place], DO, [facility]

## 2022-12-05 ENCOUNTER — Other Ambulatory Visit: Payer: 59

## 2022-12-05 ENCOUNTER — Ambulatory Visit: Payer: 59 | Admitting: Oncology

## 2022-12-06 ENCOUNTER — Ambulatory Visit: Payer: 59 | Admitting: Gastroenterology

## 2022-12-14 NOTE — Progress Notes (Signed)
Reminded patient of this appt. Let her know results would be reviewed then and reminded her to bring cardiac medications.

## 2022-12-15 ENCOUNTER — Ambulatory Visit: Payer: 59 | Admitting: Internal Medicine

## 2022-12-23 ENCOUNTER — Ambulatory Visit: Payer: 59 | Admitting: Cardiology

## 2022-12-29 ENCOUNTER — Encounter: Payer: Self-pay | Admitting: Cardiology

## 2022-12-29 ENCOUNTER — Ambulatory Visit: Payer: 59 | Admitting: Cardiology

## 2022-12-29 VITALS — BP 132/84 | HR 72 | Resp 16 | Ht 66.0 in | Wt 176.8 lb

## 2022-12-29 DIAGNOSIS — I10 Essential (primary) hypertension: Secondary | ICD-10-CM

## 2022-12-29 DIAGNOSIS — Z712 Person consulting for explanation of examination or test findings: Secondary | ICD-10-CM

## 2022-12-29 DIAGNOSIS — R072 Precordial pain: Secondary | ICD-10-CM

## 2022-12-29 NOTE — Progress Notes (Signed)
ID:  Angela Miles, DOB 12/02/1970, MRN [id number]  PCP:  Paulina Fusi, MD  Cardiologist:  Tessa Lerner, DO, [facility] (established care 11/22/22) Former Cardiology Providers: None  Date: 12/29/22 Last Office Visit: 11/22/2022  Chief Complaint  Patient presents with   Follow-up    Reevaluation of chest pain and discuss test results    HPI  Angela Miles is a 52 y.o.  female whose  past medical history and cardiovascular risk factors include: Hypertension, biliary cirrhosis, anemia, roux en y gastric bypass (2008), cholecystectomy.    She was referred to practice back in April 2024 for evaluation of precordial pain.  Given her symptoms and other cardiovascular risk factors additional workup was recommended.  Since last visit she is underwent an echocardiogram and coronary CTA.  Results reviewed with her and her husband at today's office visit.  Echocardiogram notes low normal LVEF, grade 1 diastolic impairment, mild valvular heart disease.  Coronary CTA noted minimal nonobstructive CAD.  At the last office visit her blood pressure medications were changed and she is tolerating it well and her blood pressure is also well-controlled.    Otherwise she denies anginal chest pain or heart failure symptoms.  FUNCTIONAL STATUS: Patient is able to exercise a day walking on the treadmill.    ALLERGIES: Allergies  Allergen Reactions   Penicillins Nausea Only    Other reaction(s): Not available   Nsaids Palpitations   Sulfa Antibiotics Palpitations    MEDICATION LIST PRIOR TO VISIT: Current Meds  Medication Sig   metoprolol succinate (TOPROL XL) 25 MG 24 hr tablet Take 1 tablet (25 mg total) by mouth in the morning.   milk thistle 175 MG tablet Take 2,000 mg by mouth daily.   ursodiol (ACTIGALL) 250 MG tablet TAKE 2 TABLET BY MOUTH EVERY MORNING AND AT BEDTIME   valsartan (DIOVAN) 80 MG tablet Take 1 tablet (80 mg total) by mouth at bedtime.   vitamin E 1000 UNIT capsule  Take 1,000 Units by mouth daily.     PAST MEDICAL HISTORY: Past Medical History:  Diagnosis Date   Anemia    Asthma    bronchitis   Cancer (HCC)     PAST SURGICAL HISTORY: Past Surgical History:  Procedure Laterality Date   BREAST EXCISIONAL BIOPSY Right    2014   BREAST LUMPECTOMY WITH NEEDLE LOCALIZATION Right 02/26/2013   Procedure: BREAST LUMPECTOMY WITH NEEDLE LOCALIZATION;  Surgeon: Maisie Fus A. Cornett, MD;  Location: [facility];  Service: General;  Laterality: Right;  NEEDLE LOCALIZATION AT [facility] [place] 7;30    CERVICAL BIOPSY     CESAREAN SECTION  02,05   CHOLECYSTECTOMY     COLONOSCOPY     Dr Rayfield Citizen 2-14 around 2017   DILATION AND CURETTAGE OF UTERUS  2009   uterine ablation   ESOPHAGOGASTRODUODENOSCOPY     around 08   GASTRIC BYPASS  08    FAMILY HISTORY: The patient family history includes Colon cancer in her paternal uncle; Colon polyps in her father; Dementia in her father; Hypertension in her father and mother.  SOCIAL HISTORY:  The patient  reports that she quit smoking about 13 years ago. Her smoking use included cigarettes. She has never used smokeless tobacco. She reports that she does not currently use alcohol. She reports that she does not use drugs.  REVIEW OF SYSTEMS: Review of Systems  Constitutional: Negative.  Cardiovascular:  Positive for chest pain, dyspnea on exertion and palpitations. Negative for  claudication, irregular heartbeat, leg swelling, near-syncope, orthopnea, paroxysmal nocturnal dyspnea and syncope.  Respiratory: Negative.  Negative for shortness of breath.   Hematologic/Lymphatic: Negative for bleeding problem.  Musculoskeletal:  Negative for muscle cramps and myalgias.  Gastrointestinal:  Positive for bloating, abdominal pain and heartburn.  Neurological:  Negative for dizziness and light-headedness.    PHYSICAL EXAM:    12/29/2022    4:17 PM 11/29/2022   10:11 AM 11/29/2022   10:06 AM  Vitals with BMI   Height 5\' 6"     Weight 176 lbs 13 oz    BMI 28.55    Systolic 132 123   Diastolic 84 65   Pulse 72 66 83    Physical Exam  Constitutional: She appears healthy. No distress.  Age appropriate, hemodynamically stable.   Neck: No JVD present.  Cardiovascular: Normal rate, regular rhythm, S1 normal, S2 normal, normal heart sounds, intact distal pulses and normal pulses. Exam reveals no gallop, no S3 and no S4.  No murmur heard. Pulmonary/Chest: Effort normal and breath sounds normal. No stridor. She has no wheezes. She has no rales.  Abdominal: Soft. Bowel sounds are normal. She exhibits no distension. There is no abdominal tenderness.  Musculoskeletal:        General: No edema. Normal range of motion.     Cervical back: Neck supple.  Neurological: She is alert and oriented to person, place, and time. She has intact cranial nerves (2-12).  Skin: Skin is warm and moist.   CARDIAC DATABASE: EKG: November 22, 2022: Sinus rhythm, 72 bpm, normal axis, without underlying ischemia injury pattern.  Echocardiogram: 11/23/2022:  Low normal LV systolic function with visual EF 50-55%. Left ventricle cavity is normal in size. Normal left ventricular wall thickness. Normal global wall motion. Doppler evidence of grade I (impaired) diastolic dysfunction, normal LAP. Calculated EF 48%. Structurally normal mitral valve. Mild (Grade I) mitral regurgitation. Structurally normal tricuspid valve. Mild tricuspid regurgitation. No evidence of pulmonary hypertension. No prior available for comparison.    Stress Testing: No results found for this or any previous visit from the past 1095 days.  Heart Catheterization: None  Coronary CTA 11/29/2022 1. Total coronary calcium score of 0.  2. Normal coronary origin with right dominance.  3. CAD-RADS = 1 Minimal non-obstructive.  Left Main: Minimal (0-24%) non-calcified plaque at the distal left main otherwise patent.  LAD: Patent.  LCX: Patent.  RCA:  Patent.  4. Study is sent for CT-FFR to further evaluate the left main. Findings will be performed and reported separately.  Noncardiac findings: No acute or unexpected extracardiac finding.  RECOMMENDATIONS: Consider non-atherosclerotic causes of chest pain. Consider preventive therapy and risk factor modification.  CT FFR November 29, 2022: CT FFR analysis showed no significant stenosis.   LABORATORY DATA:    Latest Ref Rng & Units 06/03/2022   12:00 AM 11/29/2021   12:00 AM 05/31/2021   12:00 AM  CBC  WBC  8.4     9.1     7.5      Hemoglobin 12.0 - 16.0 13.0     13.1     12.1      Hematocrit 36 - 46 39     40     36      Platelets 150 - 400 K/uL 262     303     257         This result is from an external source.       Latest Ref Rng &  Units 11/25/2022    8:03 AM 07/20/2021    4:42 PM 03/10/2021   12:00 AM  CMP  Glucose 70 - 99 mg/dL 92  87    BUN 6 - 24 mg/dL 16  15  13    Creatinine 0.57 - 1.00 mg/dL 1.61  0.96  0.5   Sodium 134 - 144 mmol/L 138  139  137   Potassium 3.5 - 5.2 mmol/L 4.0  3.9  3.7   Chloride 96 - 106 mmol/L 101  101  102   CO2 20 - 29 mmol/L 25  26  27    Calcium 8.7 - 10.2 mg/dL 9.1  9.1  8.3   Total Protein 6.0 - 8.5 g/dL 7.0  7.0    Total Bilirubin 0.0 - 1.2 mg/dL 0.7  0.3    Alkaline Phos 44 - 121 IU/L 136  195  89   AST 0 - 40 IU/L 45  28  26   ALT 0 - 32 IU/L 94  53  40     Lipid Panel     Component Value Date/Time   CHOL 151 11/25/2022 0803   TRIG 88 11/25/2022 0803   HDL 64 11/25/2022 0803   CHOLHDL 2.4 11/25/2022 0803   LDLCALC 71 11/25/2022 0803   LDLDIRECT 66 11/25/2022 0803   LABVLDL 16 11/25/2022 0803    No components found for: "NTPROBNP" No results for input(s): "PROBNP" in the last 8760 hours. No results for input(s): "TSH" in the last 8760 hours.  BMP Recent Labs    11/18/22 0909 11/25/22 0803  NA  --  138  K  --  4.0  CL  --  101  CO2  --  25  GLUCOSE  --  92  BUN  --  16  CREATININE  --  0.58  CALCIUM  --  9.1   GFRNONAA 60  --     HEMOGLOBIN A1C No results found for: "HGBA1C", "MPG"  External Labs 11/18/2022 - [facility]  Glucose 85, BUN/Cr 13/0.40. EGFR >60. Na/K 135/4.0. H/H 12.6/37.6. MCV 89. Platelets 279 TSH 0.84/T3 3.46/ T4 1.44   IMPRESSION:    ICD-10-CM   1. Precordial pain  R07.2     2. Benign hypertension  I10     3. Encounter to discuss test results  Z71.2        RECOMMENDATIONS:  HPI  Angela Miles is a 52 y.o.  female whose  past medical history and cardiovascular risk factors include: Hypertension, biliary cirrhosis, anemia, roux en y gastric bypass (2008), cholecystectomy.    Precordial pain Dyspnea on exertion No recurrence of chest pain. Prior EKG personally reviewed with no acute injury pattern  Outside labs personally reviewed with negative Troponins  Echo: Low normal LVEF, grade 1 diastolic impairment, mild valvular heart disease. Coronary CTA: Minimal nonobstructive CAD.  Educated on importance of improving her modifiable cardiovascular risk factors. No additional testing warranted at this time  Benign hypertension Office blood pressures are now better controlled. She was taking half a tablet of valsartan/HCTZ.  At the last office visit she requested assistance with antihypertensive medications.  She was started on valsartan and Toprol-XL.  She is doing well on both medications.  Discussed management related to chronic comorbid conditions/symptoms, reviewed results independently of the echo and coronary CTA, plan of care discussed with the patient and husband at today's office visit.  Would like to see her back on an annual basis or as needed.  Patient agreeable with the plan  of care.  FINAL MEDICATION LIST END OF ENCOUNTER: No orders of the defined types were placed in this encounter.   There are no discontinued medications.    Current Outpatient Medications:    metoprolol succinate (TOPROL XL) 25 MG 24 hr tablet, Take 1 tablet (25 mg total)  by mouth in the morning., Disp: 90 tablet, Rfl: 3   milk thistle 175 MG tablet, Take 2,000 mg by mouth daily., Disp: , Rfl:    ursodiol (ACTIGALL) 250 MG tablet, TAKE 2 TABLET BY MOUTH EVERY MORNING AND AT BEDTIME, Disp: 120 tablet, Rfl: 6   valsartan (DIOVAN) 80 MG tablet, Take 1 tablet (80 mg total) by mouth at bedtime., Disp: 90 tablet, Rfl: 3   vitamin E 1000 UNIT capsule, Take 1,000 Units by mouth daily., Disp: , Rfl:    Folic Acid (FOLATE PO), Take 800 mcg by mouth daily. (Patient not taking: Reported on 12/29/2022), Disp: , Rfl:    magnesium 30 MG tablet, Take 420 mg by mouth 2 (two) times daily. (Patient not taking: Reported on 12/29/2022), Disp: , Rfl:   No orders of the defined types were placed in this encounter.   There are no Patient Instructions on file for this visit.   --Continue cardiac medications as reconciled in final medication list. --Return in about 1 year (around 12/29/2023) for Annual follow up visit. or sooner if needed. --Continue follow-up with your primary care physician regarding the management of your other chronic comorbid conditions.  Patient's questions and concerns were addressed to her satisfaction. She voices understanding of the instructions provided during this encounter.   This note was created using a voice recognition software as a result there may be grammatical errors inadvertently enclosed that do not reflect the nature of this encounter. Every attempt is made to correct such errors.   Tessa Lerner, [place], [facility]  Pager:  [phone] Office: [phone]

## 2023-01-12 ENCOUNTER — Telehealth: Payer: Self-pay | Admitting: Gastroenterology

## 2023-01-12 MED ORDER — URSODIOL 500 MG PO TABS: 500.0000 mg | ORAL_TABLET | Freq: Two times a day (BID) | ORAL | 3 refills | Status: DC

## 2023-01-12 NOTE — Telephone Encounter (Signed)
Inbound call form patient regarding a Prescription for Ursodiol .Marland Kitchen She is currently taking 4 pills 250 mg daily and she want to know if that can be change for 2 pills 500 mg at day.Please advise

## 2023-01-12 NOTE — Telephone Encounter (Signed)
Done patient was told to call with any questions or concerns

## 2023-01-26 HISTORY — PX: COSMETIC SURGERY: SHX468

## 2023-01-31 ENCOUNTER — Ambulatory Visit: Payer: 59 | Admitting: Gastroenterology

## 2023-02-01 LAB — LAB REPORT - SCANNED
A1c: 5.1
EGFR: 67
POC INR: 0.9

## 2023-02-22 ENCOUNTER — Encounter: Payer: Self-pay | Admitting: Gastroenterology

## 2023-02-22 ENCOUNTER — Ambulatory Visit: Payer: 59 | Admitting: Gastroenterology

## 2023-02-22 VITALS — BP 122/80 | HR 68 | Ht 66.0 in | Wt 172.1 lb

## 2023-02-22 DIAGNOSIS — K743 Primary biliary cirrhosis: Secondary | ICD-10-CM | POA: Diagnosis not present

## 2023-02-22 MED ORDER — URSODIOL 500 MG PO TABS: 500.0000 mg | ORAL_TABLET | Freq: Two times a day (BID) | ORAL | 5 refills | Status: DC

## 2023-02-22 NOTE — Progress Notes (Signed)
Chief Complaint: Abnormal LFTs  Referring Provider:  Paulina Fusi, MD      ASSESSMENT AND PLAN;    1. PBC Dx on Liver Bx 03/2020, elevated AMA. Nl Alk Phos now. Has associated fatty liver. 2. H/O morbid obesity s/p RYGB 2008.  No obvious liver cirrhosis. Nl Plts, Alb 3. Sidebranch pancreatic IPMN (stable). Rpt MRI 07/2024 4. Neg screening colon 2017 by Dr. Charm Barges. Rpt in 10 yrs. 5. IDA d/t gastric bypass on periodic parenteral iron (Dr Melvyn Neth).  Has assoc B12 and folate deficiency.   Plan:  -Urso 500 mg p.o. twice daily 180, 11 RF -FU in 6 months -Vit E 400 every day to continue    HPI:    Angela Miles is a 52 y.o. female  With H/O RYGB 2008 (with resultant 129lb wt loss), anxiety/depression, HTN, mild obesity With PBC/NASH  Going to [place] for cruise.  For follow-up visit. Doing very well on Urso.  LFTs had normalized.  Most recent labs from 02/01/2023 showed alk phos 108, AST 39, ALT 50 with normal platelets 307, normal hemoglobin 13.1. USE: 11/2022 with kPA 5.9 (Normal)  S/P panniculectomy   Had negative cardiology workup.  No alcohol.  No H/O itching, skin lesions, easy bruisability, intake of OTC meds including diet pills, herbal medications, anabolic steroids or Tylenol. There is no H/O blood transfusions, IVDA or FH of liver disease. No jaundice, dark urine or pale stools. No alcohol abuse.  Has been compliant with B12.   Wt Readings from Last 3 Encounters:  02/22/23 172 lb 2 oz (78.1 kg)  12/29/22 176 lb 12.8 oz (80.2 kg)  11/22/22 174 lb 9.6 oz (79.2 kg)       Previous GI work-up:  Liver bx 03/2020 revealed PBC with grade 1-2/4 fibrosis.  USE 11/2022 -Normal liver architecture -K PA 5.9  Neg colonoscopy by Dr. Charm Barges in 2017.  Repeat in 10 years  MRCP 08/02/2022 1. Stable 5 mm cystic lesion in the pancreatic body without suspicious MRI features, likely reflecting a side branch IPMN. Recommend follow up pre and post contrast  MRI/MRCP in 2 years. 2. Stable benign Bosniak classification 1 and 2 renal cysts require no independent imaging follow-up.  MRI liver with and without contrast 03/2020 1. No acute findings within the abdomen. 2. Previous cholecystectomy. 3. Small 6 mm cystic structure associated with the neck of pancreas is identified. This is technically too small to further characterize. Follow-up imaging with repeat MRI in 12 months without and with contrast material recommended. 4. Bilateral subcentimeter T2 hyperintense kidney lesions are technically too small to reliably characterize. The largest is in the anterior cortex of the inferior pole of the right kidney measuring 8 mm. This may be mildly complex containing a thin internal area of septation. Attention on follow-up MRI is recommended.  CT AP June 2021 showing significant heterogenous enhancement pattern throughout the liver which could be related to intrinsic liver disease.  No definite liver cirrhosis.  From previous note: referred to GI clinic for abn LFTs: AST 56, ALT 106, alk phos 132 with normal CBC hemoglobin 13.0, platelets 312.  Note that iron studies were normal.    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 Cornett, MD;  Location: [facility];  Service: General;  Laterality: Right;  NEEDLE LOCALIZATION AT [facility] [place] 7;30    CERVICAL BIOPSY     CESAREAN SECTION  09/2003   CHOLECYSTECTOMY     COLONOSCOPY     Dr Rayfield Citizen 2-14 around 2017   COSMETIC SURGERY  01/26/2023   Lipo surgery on abdominal, sides and arms   DILATION AND CURETTAGE OF UTERUS  2009   uterine ablation   ESOPHAGOGASTRODUODENOSCOPY     around 08   GASTRIC BYPASS  2008    Family History  Problem Relation Age of Onset   Hypertension Mother    Colon polyps Father    Dementia Father    Hypertension Father    Colon cancer Paternal Kateri Mc        has a colestomy bag now. Around 60's   Esophageal cancer Neg Hx     Social History   Tobacco Use   Smoking status: Former    Types: Cigarettes    Quit date: 02/11/2009    Years since quitting: 14.0   Smokeless tobacco: Never  Vaping Use   Vaping Use: Never used  Substance Use Topics   Alcohol use: Not Currently    Comment: rare   Drug use: No    Current Outpatient Medications  Medication Sig Dispense Refill   cyclobenzaprine (FLEXERIL) 5 MG tablet Take 5 mg by mouth as needed.     HYDROcodone-acetaminophen (NORCO) 7.5-325 MG tablet Take 1 tablet by mouth at bedtime.     metoprolol succinate (TOPROL XL) 25 MG 24 hr tablet Take 1 tablet (25 mg total) by mouth in the morning. 90 tablet 3   tiZANidine (ZANAFLEX) 4 MG tablet Take 4 mg by mouth as needed.     ursodiol (ACTIGALL) 500 MG tablet Take 1 tablet (500 mg total) by mouth 2 (two) times daily. 180 tablet 3   valsartan (DIOVAN) 80 MG tablet Take 1 tablet (80 mg total) by mouth at bedtime. 90 tablet 3   vitamin E 1000 UNIT capsule Take 1,000 Units by mouth daily.     No current facility-administered medications for this visit.    Allergies  Allergen Reactions   Penicillins  Nausea Only    Other reaction(s): Not available   Nsaids Palpitations   Sulfa Antibiotics Palpitations    Review of Systems:  neg     Physical Exam:    BP 122/80   Pulse 68   Ht 5\' 6"  (1.676 m)   Wt 172 lb 2 oz (78.1 kg)   BMI  27.78 kg/m  Wt Readings from Last 3 Encounters:  02/22/23 172 lb 2 oz (78.1 kg)  12/29/22 176 lb 12.8 oz (80.2 kg)  11/22/22 174 lb 9.6 oz (79.2 kg)   Constitutional:  Well-developed, in no acute distress. Psychiatric: Normal mood and affect. Behavior is normal. HEENT: Pupils normal.  Conjunctivae are normal. No scleral icterus. Cardiovascular: Normal rate, regular rhythm. No edema Pulmonary/chest: Effort normal and breath sounds normal. No wheezing, rales or rhonchi. Abdominal: Soft, nondistended. Nontender. Bowel sounds active throughout. There are no masses palpable. Has hepatomegaly -liver palpated 4 cm below the costal margin. Rectal:  defered Neurological: Alert and oriented to person place and time. Skin: Skin is warm and dry. No rashes noted.  Data Reviewed: I have personally reviewed following labs and imaging studies  CBC:    Latest Ref Rng & Units 06/03/2022   12:00 AM 11/29/2021   12:00 AM 05/31/2021   12:00 AM  CBC  WBC  8.4     9.1     7.5      Hemoglobin 12.0 - 16.0 13.0     13.1     12.1      Hematocrit 36 - 46 39     40     36      Platelets 150 - 400 K/uL 262     303     257         This result is from an external source.    CMP:    Latest Ref Rng & Units 11/25/2022    8:03 AM 07/20/2021    4:42 PM 03/10/2021   12:00 AM  CMP  Glucose 70 - 99 mg/dL 92  87    BUN 6 - 24 mg/dL 16  15  13    Creatinine 0.57 - 1.00 mg/dL 5.78  4.69  0.5   Sodium 134 - 144 mmol/L 138  139  137   Potassium 3.5 - 5.2 mmol/L 4.0  3.9  3.7   Chloride 96 - 106 mmol/L 101  101  102   CO2 20 - 29 mmol/L 25  26  27    Calcium 8.7 - 10.2 mg/dL 9.1  9.1  8.3   Total Protein 6.0 - 8.5 g/dL 7.0  7.0    Total Bilirubin 0.0 - 1.2 mg/dL 0.7  0.3     Alkaline Phos 44 - 121 IU/L 136  195  89   AST 0 - 40 IU/L 45  28  26   ALT 0 - 32 IU/L 94  53  40      Angela Circle, MD 02/22/2023, 11:42 AM  Cc: Paulina Fusi, MD

## 2023-02-22 NOTE — Patient Instructions (Addendum)
_______________________________________________________  If your blood pressure at your visit was 140/90 or greater, please contact your primary care physician to follow up on this.  _______________________________________________________  If you are age 52 or older, your body mass index should be between 23-30. Your Body mass index is 27.78 kg/m. If this is out of the aforementioned range listed, please consider follow up with your Primary Care Provider.  If you are age 16 or younger, your body mass index should be between 19-25. Your Body mass index is 27.78 kg/m. If this is out of the aformentioned range listed, please consider follow up with your Primary Care Provider.   ________________________________________________________  The [facility] providers would like to encourage you to use [facility] to communicate with providers for non-urgent requests or questions.  Due to long hold times on the telephone, sending your provider a message by [facility] may be a faster and more efficient way to get a response.  Please allow 48 business hours for a response.  Please remember that this is for non-urgent requests.  _______________________________________________________  We have sent the following medications to your pharmacy for you to pick up at your convenience: Ursodiol  Please follow up in 6 months. Give [place] a call at [phone] to schedule an appointment.  Continue Vitamin E daily Thank you,  Dr. Lynann Bologna

## 2023-03-02 ENCOUNTER — Telehealth: Payer: Self-pay | Admitting: Gastroenterology

## 2023-03-02 MED ORDER — URSODIOL 500 MG PO TABS: 500.0000 mg | ORAL_TABLET | Freq: Two times a day (BID) | ORAL | 5 refills | Status: DC

## 2023-03-02 NOTE — Telephone Encounter (Signed)
Mailbox is full. Medication was sent to [facility] on 7-10 because it was the only pharmacy on file. Added [facility] and sent it there.

## 2023-03-02 NOTE — Telephone Encounter (Signed)
Inbound call from patient stating that she needs her Ursodinal prescription sent to [facility]  on  [address] in [place].  Patient is requesting a call once prescription has been sent. Please advise.

## 2023-12-29 ENCOUNTER — Ambulatory Visit: Payer: Self-pay | Admitting: Cardiology

## 2024-01-22 ENCOUNTER — Telehealth: Payer: Self-pay | Admitting: Gastroenterology

## 2024-01-22 NOTE — Telephone Encounter (Signed)
Patient calling in regards to previous note. Please advise.

## 2024-01-22 NOTE — Telephone Encounter (Signed)
 Pt stated that she has been having constipation for a about a month now with only really small BM random. Pt stated that on Thursday that she took Miralax, Friday a laxitive and Saturday 2 enemas. Pt last BM was on Saturday. ( Small)  Pt was rescheduled to a sooner office visit to see Dr. Venice Gillis on 01/29/2024 at 2:30 PM Pt made aware.  Pt was notified to use Miralax twice daily until a large BM and then once a day. Drink lots of water and walk a moderate amount to assist with the BM.  Pt verbalized understanding with all questions answered.

## 2024-01-22 NOTE — Telephone Encounter (Signed)
 Inbound call from patient, states she has been having some constipation, patient states last time she was experiencing symptoms, dr. Venice Gillis ordered an Xray and it showed an impaction, patient would like order sent for exam prior to appointment on 7/1.

## 2024-01-29 ENCOUNTER — Other Ambulatory Visit (INDEPENDENT_AMBULATORY_CARE_PROVIDER_SITE_OTHER)

## 2024-01-29 ENCOUNTER — Encounter: Payer: Self-pay | Admitting: Gastroenterology

## 2024-01-29 ENCOUNTER — Ambulatory Visit: Admitting: Gastroenterology

## 2024-01-29 VITALS — BP 120/80 | HR 67 | Ht 64.25 in | Wt 162.4 lb

## 2024-01-29 DIAGNOSIS — K5900 Constipation, unspecified: Secondary | ICD-10-CM

## 2024-01-29 DIAGNOSIS — D509 Iron deficiency anemia, unspecified: Secondary | ICD-10-CM | POA: Diagnosis not present

## 2024-01-29 DIAGNOSIS — K743 Primary biliary cirrhosis: Secondary | ICD-10-CM

## 2024-01-29 DIAGNOSIS — Z9884 Bariatric surgery status: Secondary | ICD-10-CM

## 2024-01-29 DIAGNOSIS — D136 Benign neoplasm of pancreas: Secondary | ICD-10-CM

## 2024-01-29 DIAGNOSIS — K625 Hemorrhage of anus and rectum: Secondary | ICD-10-CM

## 2024-01-29 DIAGNOSIS — E538 Deficiency of other specified B group vitamins: Secondary | ICD-10-CM | POA: Diagnosis not present

## 2024-01-29 DIAGNOSIS — D490 Neoplasm of unspecified behavior of digestive system: Secondary | ICD-10-CM

## 2024-01-29 LAB — CBC WITH DIFFERENTIAL/PLATELET
Basophils Absolute: 0 10*3/uL (ref 0.0–0.1)
Basophils Relative: 0.6 % (ref 0.0–3.0)
Eosinophils Absolute: 0.1 10*3/uL (ref 0.0–0.7)
Eosinophils Relative: 2 % (ref 0.0–5.0)
HCT: 35.4 % — ABNORMAL LOW (ref 36.0–46.0)
Hemoglobin: 11.8 g/dL — ABNORMAL LOW (ref 12.0–15.0)
Lymphocytes Relative: 19.9 % (ref 12.0–46.0)
Lymphs Abs: 1.4 10*3/uL (ref 0.7–4.0)
MCHC: 33.4 g/dL (ref 30.0–36.0)
MCV: 89.9 fl (ref 78.0–100.0)
Monocytes Absolute: 0.7 10*3/uL (ref 0.1–1.0)
Monocytes Relative: 9.8 % (ref 3.0–12.0)
Neutro Abs: 4.8 10*3/uL (ref 1.4–7.7)
Neutrophils Relative %: 67.7 % (ref 43.0–77.0)
Platelets: 305 10*3/uL (ref 150.0–400.0)
RBC: 3.94 Mil/uL (ref 3.87–5.11)
RDW: 14.2 % (ref 11.5–15.5)
WBC: 7.1 10*3/uL (ref 4.0–10.5)

## 2024-01-29 LAB — COMPREHENSIVE METABOLIC PANEL WITH GFR
ALT: 66 U/L — ABNORMAL HIGH (ref 0–35)
AST: 35 U/L (ref 0–37)
Albumin: 4 g/dL (ref 3.5–5.2)
Alkaline Phosphatase: 194 U/L — ABNORMAL HIGH (ref 39–117)
BUN: 15 mg/dL (ref 6–23)
CO2: 30 meq/L (ref 19–32)
Calcium: 8.9 mg/dL (ref 8.4–10.5)
Chloride: 100 meq/L (ref 96–112)
Creatinine, Ser: 0.51 mg/dL (ref 0.40–1.20)
GFR: 106.82 mL/min (ref >60.00)
Glucose, Bld: 91 mg/dL (ref 70–99)
Potassium: 4.1 meq/L (ref 3.5–5.1)
Sodium: 136 meq/L (ref 135–145)
Total Bilirubin: 0.6 mg/dL (ref 0.2–1.2)
Total Protein: 7.7 g/dL (ref 6.0–8.3)

## 2024-01-29 LAB — LIPASE: Lipase: 57 U/L (ref 11.0–59.0)

## 2024-01-29 LAB — TSH: TSH: 1.36 u[IU]/mL (ref 0.35–5.50)

## 2024-01-29 MED ORDER — HYDROCORTISONE ACETATE 25 MG RE SUPP: 25.0000 mg | Freq: Two times a day (BID) | RECTAL | 4 refills | Status: DC

## 2024-01-29 MED ORDER — NA SULFATE-K SULFATE-MG SULF 17.5-3.13-1.6 GM/177ML PO SOLN: 1.0000 | Freq: Once | ORAL | 0 refills | Status: AC

## 2024-01-29 MED ORDER — URSODIOL 500 MG PO TABS: 500.0000 mg | ORAL_TABLET | Freq: Two times a day (BID) | ORAL | 2 refills | Status: DC

## 2024-01-29 NOTE — Patient Instructions (Addendum)
 _______________________________________________________  If your blood pressure at your visit was 140/90 or greater, please contact your primary care physician to follow up on this.  _______________________________________________________  If you are age 53 or older, your body mass index should be between 23-30. Your Body mass index is 27.66 kg/m. If this is out of the aforementioned range listed, please consider follow up with your Primary Care Provider.  If you are age 62 or younger, your body mass index should be between 19-25. Your Body mass index is 27.66 kg/m. If this is out of the aformentioned range listed, please consider follow up with your Primary Care Provider.   ________________________________________________________  The [facility] providers would like to encourage you to use MYCHART to communicate with providers for non-urgent requests or questions.  Due to long hold times on the telephone, sending your provider a message by [facility] may be a faster and more efficient way to get a response.  Please allow 48 business hours for a response.  Please remember that this is for non-urgent requests.  _______________________________________________________  Your provider has requested that you go to the basement level for lab work before leaving today. Press B on the elevator. The lab is located at the first door on the left as you exit the elevator.  We have sent the following medications to your pharmacy for you to pick up at your convenience: Suprep Urso   Hydrocortisone supp  Two days before your procedure: Mix 3 packs (or capfuls) of Miralax in 48 ounces of clear liquid and drink at 6pm.  Please follow up in 6 months. Give us  a call at [phone] to schedule an appointment.  Continue miralax daily and vitamin E 400mg  daily   Please get a bone density scan from your primary care office  You have been scheduled for a colonoscopy. Please follow written instructions given to  you at your visit today.   If you use inhalers (even only as needed), please bring them with you on the day of your procedure.  DO NOT TAKE 7 DAYS PRIOR TO TEST- Trulicity (dulaglutide) Ozempic, Wegovy (semaglutide) Mounjaro (tirzepatide) Bydureon Bcise (exanatide extended release)  DO NOT TAKE 1 DAY PRIOR TO YOUR TEST Rybelsus (semaglutide) Adlyxin (lixisenatide) Victoza (liraglutide) Byetta (exanatide) ___________________________________________________________________________  Thank you,  Dr. Lajuan Pila

## 2024-01-29 NOTE — Progress Notes (Signed)
 Chief Complaint: FU  Referring Provider:  Adrian Hopper, MD      ASSESSMENT AND PLAN;    1. PBC Dx on Liver Bx 03/2020, elevated AMA. Nl Alk Phos now. Has associated fatty liver. 2. Constipation with rectal bleeding. Neg colon 2017. 3. H/O morbid obesity s/p RYGB 2008.  No obvious liver cirrhosis. Nl Plts, Alb 4. Sidebranch pancreatic IPMN (stable). Rpt MRI 07/2024 5. IDA d/t gastric bypass on periodic parenteral iron  (Dr Harles Lied)- resolved.  Has assoc B12 and folate deficiency.   Plan:  -CBC, CMP, lipase, TSH -HC 2.5 % supp BID x 10 days. 4RF -Colon with 2 day prep -Continue mirlax 17g po QD -MRI pancrease 07/2024 -Bone density scan (Dr Ileana Mallard) -Urso  500 mg p.o. twice daily 180, 11 RF -FU in 6 months -Vit E 400 every day to continue  -Check INR at time of colon.   HPI:    Angela Miles is a 53 y.o. female  With H/O RYGB 2008 (with resultant 129lb wt loss), anxiety/depression, HTN, mild obesity With PBC/NASH  History of Present Illness Angela Miles is a 53 year old female with chronic constipation and hemorrhoids who presents with worsening symptoms and bleeding.  She has experienced chronic constipation for several years, with symptoms worsening over the past few months. Her constipation flares up periodically, often becoming problematic before she realizes the severity. During a recent vacation from May 16 to May 25, she did not have a bowel movement, leading to significant discomfort and inability to eat due to stomach pain. She has been using MiraLAX since June 5, initially once a day, then increased to twice a day along with one or two enemas daily over the past week. This regimen has resulted in minimal stool passage, described as 'two little rocks,' with more blood than stool. She has also been manually disimpacting herself, which she believes has worsened her hemorrhoids.  She has a history of hemorrhoids, which have been exacerbated by her constipation.  Recently, she experienced significant bleeding from her hemorrhoids for about ten days, which has since stopped. She has been using hemorrhoid suppositories, which have provided some relief, although her insurance did not cover them, and she had to purchase a limited supply.  Her last colonoscopy was in 2017, and she recalls that the preparation was not very effective. She is concerned about her gastrointestinal health, particularly given her history of liver issues and the recent exacerbation of her symptoms. She has been trying to increase her fiber intake by incorporating chia seeds, strawberries, and pineapple into her diet.  She reports bloating, stomach pain, and significant bleeding from hemorrhoids. No recent fever or other systemic symptoms. She recently traveled to [place] and [address] and has had plastic surgery since her last visit.   No H/O itching, skin lesions, easy bruisability, intake of OTC meds including diet pills, herbal medications, anabolic steroids or Tylenol . There is no H/O blood transfusions, IVDA or FH of liver disease. No jaundice, dark urine or pale stools. No alcohol abuse.  Has been compliant with B12.   Wt Readings from Last 3 Encounters:  01/29/24 162 lb 6 oz (73.7 kg)  02/22/23 172 lb 2 oz (78.1 kg)  12/29/22 176 lb 12.8 oz (80.2 kg)       Previous GI work-up:  Liver bx 03/2020 revealed PBC with grade 1-2/4 fibrosis.  USE 11/2022 -Normal liver architecture -K PA 5.9  Neg colonoscopy by Dr. Randal Bury in 2017.  Repeat in 10  years  MRCP 08/02/2022 1. Stable 5 mm cystic lesion in the pancreatic body without suspicious MRI features, likely reflecting a side branch IPMN. Recommend follow up pre and post contrast MRI/MRCP in 2 years. 2. Stable benign Bosniak classification 1 and 2 renal cysts require no independent imaging follow-up.  MRI liver with and without contrast 03/2020 1. No acute findings within the abdomen. 2. Previous cholecystectomy. 3.  Small 6 mm cystic structure associated with the neck of pancreas is identified. This is technically too small to further characterize. Follow-up imaging with repeat MRI in 12 months without and with contrast material recommended. 4. Bilateral subcentimeter T2 hyperintense kidney lesions are technically too small to reliably characterize. The largest is in the anterior cortex of the inferior pole of the right kidney measuring 8 mm. This may be mildly complex containing a thin internal area of septation. Attention on follow-up MRI is recommended.  CT AP June 2021 showing significant heterogenous enhancement pattern throughout the liver which could be related to intrinsic liver disease.  No definite liver cirrhosis.  From previous note: referred to GI clinic for abn LFTs: AST 56, ALT 106, alk phos 132 with normal CBC hemoglobin 13.0, platelets 312.  Note that iron  studies were normal.    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 Cornett, MD;  Location: [facility];  Service: General;  Laterality: Right;  NEEDLE LOCALIZATION AT [facility]  [place] 7;30    CERVICAL BIOPSY     CESAREAN SECTION  09/2003   CHOLECYSTECTOMY     COLONOSCOPY     Dr Florene Huntington 2-14 around 2017   COSMETIC SURGERY  01/26/2023   Lipo surgery on abdominal, sides and arms   COSMETIC SURGERY     abd and arms   DILATION AND CURETTAGE OF UTERUS  2009   uterine ablation   ESOPHAGOGASTRODUODENOSCOPY     around 08   GASTRIC BYPASS  2008    Family History  Problem Relation Age of Onset   Hypertension Mother    Colon polyps Father    Dementia Father    Hypertension Father    Colon cancer Paternal Darlene Ehlers        has a colestomy bag now. Around 60's   Esophageal cancer Neg Hx     Social History  Tobacco Use   Smoking status: Former    Current packs/day: 0.00    Types: Cigarettes    Quit date: 02/11/2009    Years since quitting: 14.9   Smokeless tobacco: Never  Vaping Use   Vaping status: Never Used  Substance Use Topics   Alcohol use: Not Currently    Comment: rare   Drug use: No    Current Outpatient Medications  Medication Sig Dispense Refill   ANUCORT-HC 25 MG suppository Place 25 mg rectally at bedtime.     metoprolol  succinate (TOPROL  XL) 25 MG 24 hr tablet Take 1 tablet (25 mg total) by mouth in the morning. 90 tablet 3   tiZANidine (ZANAFLEX) 4 MG tablet Take 4 mg by mouth as needed.     ursodiol  (ACTIGALL ) 500 MG tablet Take 1 tablet (500 mg total) by mouth 2 (two) times daily. 180 tablet 5   valsartan  (DIOVAN ) 80 MG tablet Take 1 tablet (80 mg total) by mouth at bedtime. 90 tablet 3   vitamin E 1000 UNIT capsule Take 1,000 Units by mouth daily.     No current facility-administered medications for this visit.    Allergies  Allergen Reactions   Penicillins Nausea Only    Other reaction(s): Not available   Nsaids Palpitations   Sulfa Antibiotics Palpitations    Review of Systems:  neg     Physical Exam:    BP 120/80 (BP Location: Left Arm, Patient Position: Sitting)   Pulse 67   Ht 5' 4.25 (1.632 m) Comment: height  measured without shoes  Wt 162 lb 6 oz (73.7 kg)   BMI 27.66 kg/m  Wt Readings from Last 3 Encounters:  01/29/24 162 lb 6 oz (73.7 kg)  02/22/23 172 lb 2 oz (78.1 kg)  12/29/22 176 lb 12.8 oz (80.2 kg)   Constitutional:  Well-developed, in no acute distress. Psychiatric: Normal mood and affect. Behavior is normal. HEENT: Pupils normal.  Conjunctivae are normal. No scleral icterus. Cardiovascular: Normal rate, regular rhythm. No edema Pulmonary/chest: Effort normal and breath sounds normal. No wheezing, rales or rhonchi. Abdominal: Soft, nondistended. Nontender. Bowel sounds active throughout. There are no masses palpable. Has hepatomegaly -liver palpated 4 cm below the costal margin. Rectal:  defered Neurological: Alert and oriented to person place and time. Skin: Skin is warm and dry. No rashes noted.  Data Reviewed: I have personally reviewed following labs and imaging studies  CBC:    Latest Ref Rng & Units 06/03/2022   12:00 AM 11/29/2021   12:00 AM 05/31/2021   12:00 AM  CBC  WBC  8.4     9.1     7.5      Hemoglobin 12.0 - 16.0 13.0     13.1     12.1      Hematocrit 36 - 46 39     40     36      Platelets 150 - 400 K/uL 262     303     257         This result is from an external source.    CMP:    Latest Ref Rng & Units 11/25/2022    8:03 AM 07/20/2021    4:42 PM 03/10/2021   12:00 AM  CMP  Glucose 70 - 99 mg/dL 92  87    BUN 6 - 24 mg/dL 16  15  13    Creatinine 0.57 - 1.00 mg/dL 1.19  1.47  0.5   Sodium 134 -  144 mmol/L 138  139  137   Potassium 3.5 - 5.2 mmol/L 4.0  3.9  3.7   Chloride 96 - 106 mmol/L 101  101  102   CO2 20 - 29 mmol/L 25  26  27    Calcium 8.7 - 10.2 mg/dL 9.1  9.1  8.3   Total Protein 6.0 - 8.5 g/dL 7.0  7.0    Total Bilirubin 0.0 - 1.2 mg/dL 0.7  0.3    Alkaline Phos 44 - 121 IU/L 136  195  89   AST 0 - 40 IU/L 45  28  26   ALT 0 - 32 IU/L 94  53  40      Magnus Schuller, MD 01/29/2024, 2:39 PM  Cc: Adrian Hopper, MD

## 2024-01-31 ENCOUNTER — Other Ambulatory Visit (HOSPITAL_COMMUNITY): Payer: Self-pay

## 2024-01-31 ENCOUNTER — Encounter: Payer: Self-pay | Admitting: Oncology

## 2024-02-02 ENCOUNTER — Telehealth: Payer: Self-pay

## 2024-02-02 ENCOUNTER — Telehealth: Payer: Self-pay | Admitting: Gastroenterology

## 2024-02-02 MED ORDER — HYDROCORTISONE (PERIANAL) 2.5 % EX CREA: 1.0000 | TOPICAL_CREAM | Freq: Two times a day (BID) | CUTANEOUS | 1 refills | Status: DC

## 2024-02-02 NOTE — Telephone Encounter (Signed)
 Patient states pharmacy advised her that she needs authorization for hydrocortisone.  Please advise  Thank you

## 2024-02-02 NOTE — Telephone Encounter (Signed)
 Sent the cream and told patient how to do it with the preparation h supp

## 2024-02-02 NOTE — Telephone Encounter (Signed)
 Pharmacy Patient Advocate Encounter   Received notification from Pt Calls Messages that prior authorization for Anucort-HC 25MG  suppositories is required/requested.   Insurance verification completed.   The patient is insured through [facility] .   Prior Authorization for Anucort-HC 25MG  suppositories has been DENIED.  Full denial letter will be uploaded to the media tab. See denial reason below.  Per your health plan's criteria, this drug is covered if you meet the following:  (1) If the requested drug has a lower tier drug(s) with the same active ingredient, both of the following:  (I) Your doctor provides medical records (for example: chart notes) showing you cannot use (experienced intolerance, for example: allergy to excipient) all lower tier drug(s) with the same active ingredient: hydrocortisone cream (II) There are paid claims or your doctor provides medical records (for example: chart notes) showing you have tried and failed all lower tier drugs within the same drug class (If there are no lower tier drugs within the same therapeutic class, you must have failed or cannot use all lower tier drugs). For example: lower tier drugs include: Brand Analpram-HC cream, hydrocortisone-pramoxine, Proctofoam HC, Procto-Med HC. (2) Your doctor provides medical records (fo example: chart notes) showing there are clinical reasons why the requested drug would work for your condition and the lower tier drugs would not.   PA #/Case ID/Reference #: [id number]

## 2024-02-13 ENCOUNTER — Ambulatory Visit: Admitting: Gastroenterology

## 2024-03-03 ENCOUNTER — Ambulatory Visit: Payer: Self-pay | Admitting: Gastroenterology

## 2024-03-04 ENCOUNTER — Other Ambulatory Visit: Payer: Self-pay

## 2024-03-04 DIAGNOSIS — R748 Abnormal levels of other serum enzymes: Secondary | ICD-10-CM

## 2024-03-08 ENCOUNTER — Other Ambulatory Visit: Payer: Self-pay | Admitting: Internal Medicine

## 2024-03-08 DIAGNOSIS — Z1231 Encounter for screening mammogram for malignant neoplasm of breast: Secondary | ICD-10-CM

## 2024-03-13 ENCOUNTER — Encounter: Payer: Self-pay | Admitting: Gastroenterology

## 2024-03-13 ENCOUNTER — Ambulatory Visit (AMBULATORY_SURGERY_CENTER): Admitting: Gastroenterology

## 2024-03-13 VITALS — BP 152/89 | HR 65 | Temp 97.2°F | Resp 15 | Ht 64.25 in | Wt 162.0 lb

## 2024-03-13 DIAGNOSIS — K743 Primary biliary cirrhosis: Secondary | ICD-10-CM

## 2024-03-13 DIAGNOSIS — K640 First degree hemorrhoids: Secondary | ICD-10-CM | POA: Diagnosis not present

## 2024-03-13 DIAGNOSIS — K5730 Diverticulosis of large intestine without perforation or abscess without bleeding: Secondary | ICD-10-CM

## 2024-03-13 DIAGNOSIS — Z1211 Encounter for screening for malignant neoplasm of colon: Secondary | ICD-10-CM | POA: Diagnosis present

## 2024-03-13 DIAGNOSIS — Q439 Congenital malformation of intestine, unspecified: Secondary | ICD-10-CM

## 2024-03-13 MED ORDER — SODIUM CHLORIDE 0.9 % IV SOLN: 500.0000 mL | Freq: Once | INTRAVENOUS | Status: DC

## 2024-03-13 NOTE — Patient Instructions (Addendum)
 Resume previous diet.  Continue present medications including Miralax 17 g by mouth daily.  Repeat colonoscopy in 10 years for screening purposes.  Handout provided on diverticulosis and hemorrhoids.   YOU HAD AN ENDOSCOPIC PROCEDURE TODAY AT THE [facility]:   Refer to the procedure report that was given to you for any specific questions about what was found during the examination.  If the procedure report does not answer your questions, please call your gastroenterologist to clarify.  If you requested that your care partner not be given the details of your procedure findings, then the procedure report has been included in a sealed envelope for you to review at your convenience later.  YOU SHOULD EXPECT: Some feelings of bloating in the abdomen. Passage of more gas than usual.  Walking can help get rid of the air that was put into your GI tract during the procedure and reduce the bloating. If you had a lower endoscopy (such as a colonoscopy or flexible sigmoidoscopy) you may notice spotting of blood in your stool or on the toilet paper. If you underwent a bowel prep for your procedure, you may not have a normal bowel movement for a few days.  Please Note:  You might notice some irritation and congestion in your nose or some drainage.  This is from the oxygen used during your procedure.  There is no need for concern and it should clear up in a day or so.  SYMPTOMS TO REPORT IMMEDIATELY:  Following lower endoscopy (colonoscopy or flexible sigmoidoscopy):  Excessive amounts of blood in the stool  Significant tenderness or worsening of abdominal pains  Swelling of the abdomen that is new, acute  Fever of 100F or higher  For urgent or emergent issues, a gastroenterologist can be reached at any hour by calling [phone]. Do not use MyChart messaging for urgent concerns.    DIET:  We do recommend a small meal at first, but then you may proceed to your regular diet.  Drink plenty  of fluids but you should avoid alcoholic beverages for 24 hours.  ACTIVITY:  You should plan to take it easy for the rest of today and you should NOT DRIVE or use heavy machinery until tomorrow (because of the sedation medicines used during the test).    FOLLOW UP: Our staff will call the number listed on your records the next business day following your procedure.  We will call around 7:15- 8:00 am to check on you and address any questions or concerns that you may have regarding the information given to you following your procedure. If we do not reach you, we will leave a message.     If any biopsies were taken you will be contacted by phone or by letter within the next 1-3 weeks.  Please call us  at [phone] if you have not heard about the biopsies in 3 weeks.    SIGNATURES/CONFIDENTIALITY: You and/or your care partner have signed paperwork which will be entered into your electronic medical record.  These signatures attest to the fact that that the information above on your After Visit Summary has been reviewed and is understood.  Full responsibility of the confidentiality of this discharge information lies with you and/or your care-partner.

## 2024-03-13 NOTE — Progress Notes (Signed)
 Chief Complaint: FU  Referring Provider:  Keren Vicenta BRAVO, MD      ASSESSMENT AND PLAN;    1. PBC Dx on Liver Bx 03/2020, elevated AMA. Nl Alk Phos now. Has associated fatty liver. 2. Constipation with rectal bleeding. Neg colon 2017. 3. H/O morbid obesity s/p RYGB 2008.  No obvious liver cirrhosis. Nl Plts, Alb 4. Sidebranch pancreatic IPMN (stable). Rpt MRI 07/2024 5. IDA d/t gastric bypass on periodic parenteral iron  (Dr Ezzard)- resolved.  Has assoc B12 and folate deficiency.   Plan:  -CBC, CMP, lipase, TSH -HC 2.5 % supp BID x 10 days. 4RF -Colon with 2 day prep -Continue mirlax 17g po QD -MRI pancrease 07/2024 -Bone density scan (Dr Keren) -Urso  500 mg p.o. twice daily 180, 11 RF -FU in 6 months -Vit E 400 every day to continue  -Check INR at time of colon.   HPI:    Angela Miles is a 53 y.o. female  With H/O RYGB 2008 (with resultant 129lb wt loss), anxiety/depression, HTN, mild obesity With PBC/NASH  History of Present Illness Angela Miles is a 53 year old female with chronic constipation and hemorrhoids who presents with worsening symptoms and bleeding.  She has experienced chronic constipation for several years, with symptoms worsening over the past few months. Her constipation flares up periodically, often becoming problematic before she realizes the severity. During a recent vacation from May 16 to May 25, she did not have a bowel movement, leading to significant discomfort and inability to eat due to stomach pain. She has been using MiraLAX since June 5, initially once a day, then increased to twice a day along with one or two enemas daily over the past week. This regimen has resulted in minimal stool passage, described as 'two little rocks,' with more blood than stool. She has also been manually disimpacting herself, which she believes has worsened her hemorrhoids.  She has a history of hemorrhoids, which have been exacerbated by her constipation.  Recently, she experienced significant bleeding from her hemorrhoids for about ten days, which has since stopped. She has been using hemorrhoid suppositories, which have provided some relief, although her insurance did not cover them, and she had to purchase a limited supply.  Her last colonoscopy was in 2017, and she recalls that the preparation was not very effective. She is concerned about her gastrointestinal health, particularly given her history of liver issues and the recent exacerbation of her symptoms. She has been trying to increase her fiber intake by incorporating chia seeds, strawberries, and pineapple into her diet.  She reports bloating, stomach pain, and significant bleeding from hemorrhoids. No recent fever or other systemic symptoms. She recently traveled to [place] and [address] and has had plastic surgery since her last visit.   No H/O itching, skin lesions, easy bruisability, intake of OTC meds including diet pills, herbal medications, anabolic steroids or Tylenol . There is no H/O blood transfusions, IVDA or FH of liver disease. No jaundice, dark urine or pale stools. No alcohol abuse.  Has been compliant with B12.   Wt Readings from Last 3 Encounters:  03/13/24 162 lb (73.5 kg)  01/29/24 162 lb 6 oz (73.7 kg)  02/22/23 172 lb 2 oz (78.1 kg)       Previous GI work-up:  Liver bx 03/2020 revealed PBC with grade 1-2/4 fibrosis.  USE 11/2022 -Normal liver architecture -K PA 5.9  Neg colonoscopy by Dr. Towana in 2017.  Repeat in 10 years  MRCP 08/02/2022 1. Stable 5 mm cystic lesion in the pancreatic body without suspicious MRI features, likely reflecting a side branch IPMN. Recommend follow up pre and post contrast MRI/MRCP in 2 years. 2. Stable benign Bosniak classification 1 and 2 renal cysts require no independent imaging follow-up.  MRI liver with and without contrast 03/2020 1. No acute findings within the abdomen. 2. Previous cholecystectomy. 3. Small 6  mm cystic structure associated with the neck of pancreas is identified. This is technically too small to further characterize. Follow-up imaging with repeat MRI in 12 months without and with contrast material recommended. 4. Bilateral subcentimeter T2 hyperintense kidney lesions are technically too small to reliably characterize. The largest is in the anterior cortex of the inferior pole of the right kidney measuring 8 mm. This may be mildly complex containing a thin internal area of septation. Attention on follow-up MRI is recommended.  CT AP June 2021 showing significant heterogenous enhancement pattern throughout the liver which could be related to intrinsic liver disease.  No definite liver cirrhosis.  From previous note: referred to GI clinic for abn LFTs: AST 56, ALT 106, alk phos 132 with normal CBC hemoglobin 13.0, platelets 312.  Note that iron  studies were normal.    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 Cornett, MD;  Location: [facility];  Service: General;  Laterality: Right;  NEEDLE LOCALIZATION AT [facility] 7;30     CERVICAL BIOPSY     CESAREAN SECTION  09/2003   CHOLECYSTECTOMY     COLONOSCOPY     Dr Anette 2-14 around 2017   COSMETIC SURGERY  01/26/2023   Lipo surgery on abdominal, sides and arms   COSMETIC SURGERY     abd and arms   DILATION AND CURETTAGE OF UTERUS  2009   uterine ablation   ESOPHAGOGASTRODUODENOSCOPY     around 08   GASTRIC BYPASS  2008    Family History  Problem Relation Age of Onset   Hypertension Mother    Colon polyps Father    Dementia Father    Hypertension Father    Colon cancer Paternal Higinio        has a colestomy bag now. Around 60's   Esophageal cancer Neg Hx    Rectal cancer Neg Hx  Stomach cancer Neg Hx     Social History   Tobacco Use   Smoking status: Former    Current packs/day: 0.00    Types: Cigarettes    Quit date: 02/11/2009    Years since quitting: 15.0   Smokeless tobacco: Never  Vaping Use   Vaping status: Never Used  Substance Use Topics   Alcohol use: Not Currently    Comment: rare   Drug use: No    Current Outpatient Medications  Medication Sig Dispense Refill   metoprolol  succinate (TOPROL  XL) 25 MG 24 hr tablet Take 1 tablet (25 mg total) by mouth in the morning. 90 tablet 3   tiZANidine (ZANAFLEX) 4 MG tablet Take 4 mg by mouth as needed.     ursodiol  (ACTIGALL ) 500 MG tablet Take 1 tablet (500 mg total) by mouth 2 (two) times daily. 180 tablet 2   valsartan  (DIOVAN ) 80 MG tablet Take 1 tablet (80 mg total) by mouth at bedtime. 90 tablet 3   vitamin E 1000 UNIT capsule Take 1,000 Units by mouth daily.     ANUCORT-HC  25 MG suppository Place 25 mg rectally at bedtime.     hydrocortisone  (ANUSOL -HC) 2.5 % rectal cream Place 1 Application rectally 2 (two) times daily. For 10 days and then do as needed 30 g 1   hydrocortisone  (ANUSOL -HC) 25 MG suppository Place 1 suppository (25 mg total) rectally 2 (two) times daily. For 10 days 20 suppository 4   Current Facility-Administered Medications  Medication Dose Route Frequency  Provider Last Rate Last Admin   0.9 %  sodium chloride  infusion  500 mL Intravenous Once Charlanne Groom, MD        Allergies  Allergen Reactions   Nsaids Palpitations   Penicillins Nausea Only    Other reaction(s): Not available   Sulfa Antibiotics Palpitations    Review of Systems:  neg     Physical Exam:    BP (!) 143/78   Pulse 69   Temp (!) 97.2 F (36.2 C) (Skin)   Ht 5' 4.25 (1.632 m)   Wt 162 lb (73.5 kg)   SpO2 100%   BMI 27.59 kg/m  Wt Readings from Last 3 Encounters:  03/13/24 162 lb (73.5 kg)  01/29/24 162 lb 6 oz (73.7 kg)  02/22/23 172 lb 2 oz (78.1 kg)   Constitutional:  Well-developed, in no acute distress. Psychiatric: Normal mood and affect. Behavior is normal. HEENT: Pupils normal.  Conjunctivae are normal. No scleral icterus. Cardiovascular: Normal rate, regular rhythm. No edema Pulmonary/chest: Effort normal and breath sounds normal. No wheezing, rales or rhonchi. Abdominal: Soft, nondistended. Nontender. Bowel sounds active throughout. There are no masses palpable. Has hepatomegaly -liver palpated 4 cm below the costal margin. Rectal:  defered Neurological: Alert and oriented to person place and time. Skin: Skin is warm and dry. No rashes noted.  Data Reviewed: I have personally reviewed following labs and imaging studies  CBC:    Latest Ref Rng & Units 01/29/2024    3:24 PM 06/03/2022   12:00 AM 11/29/2021   12:00 AM  CBC  WBC 4.0 - 10.5 K/uL 7.1  8.4     9.1      Hemoglobin 12.0 - 15.0 g/dL 88.1  86.9     86.8      Hematocrit 36.0 - 46.0 % 35.4  39     40      Platelets 150.0 - 400.0 K/uL 305.0  262     303  This result is from an external source.    CMP:    Latest Ref Rng & Units 01/29/2024    3:24 PM 11/25/2022    8:03 AM 07/20/2021    4:42 PM  CMP  Glucose 70 - 99 mg/dL 91  92  87   BUN 6 - 23 mg/dL 15  16  15    Creatinine 0.40 - 1.20 mg/dL 9.48  9.41  9.29   Sodium 135 - 145 mEq/L 136  138  139   Potassium 3.5 - 5.1  mEq/L 4.1  4.0  3.9   Chloride 96 - 112 mEq/L 100  101  101   CO2 19 - 32 mEq/L 30  25  26    Calcium 8.4 - 10.5 mg/dL 8.9  9.1  9.1   Total Protein 6.0 - 8.3 g/dL 7.7  7.0  7.0   Total Bilirubin 0.2 - 1.2 mg/dL 0.6  0.7  0.3   Alkaline Phos 39 - 117 U/L 194  136  195   AST 0 - 37 U/L 35  45  28   ALT 0 - 35 U/L 66  94  53      Anselm Bring, MD 03/13/2024, 1:33 PM  Cc: Keren Vicenta BRAVO, MD

## 2024-03-13 NOTE — Op Note (Signed)
 [facility] Patient Name: Angela Miles Procedure Date: 03/13/2024 1:37 PM MRN: [id number] Endoscopist: Lynnie Bring , MD, [id number] Age: 53 Referring MD:  Date of Birth: 1971-04-15 Gender: Female Account #: [id number] Procedure:                Colonoscopy Indications:              Screening for colorectal malignant neoplasm Medicines:                Monitored Anesthesia Care Procedure:                Pre-Anesthesia Assessment:                           - Prior to the procedure, a History and Physical                            was performed, and patient medications and                            allergies were reviewed. The patient's tolerance of                            previous anesthesia was also reviewed. The risks                            and benefits of the procedure and the sedation                            options and risks were discussed with the patient.                            All questions were answered, and informed consent                            was obtained. Prior Anticoagulants: The patient has                            taken no anticoagulant or antiplatelet agents. ASA                            Grade Assessment: II - A patient with mild systemic                            disease. After reviewing the risks and benefits,                            the patient was deemed in satisfactory condition to                            undergo the procedure.                           After obtaining informed consent, the colonoscope  was passed under direct vision. Throughout the                            procedure, the patient's blood pressure, pulse, and                            oxygen saturations were monitored continuously. The                            CF [id number] #[id number] was introduced through the anus                            and advanced to the 2 cm into the ileum. The                            colonoscopy was  performed without difficulty. The                            patient tolerated the procedure well. The quality                            of the bowel preparation was good. The terminal                            ileum, ileocecal valve, appendiceal orifice, and                            rectum were photographed. Scope In: 1:39:27 PM Scope Out: 1:52:01 PM Scope Withdrawal Time: 0 hours 8 minutes 42 seconds  Total Procedure Duration: 0 hours 12 minutes 34 seconds  Findings:                 The colon (entire examined portion) appeared normal.                           A few rare (1-2) small-mouthed diverticula were                            found in the sigmoid colon.                           Non-bleeding internal hemorrhoids were found during                            retroflexion. The hemorrhoids were small and Grade                            I (internal hemorrhoids that do not prolapse).                           The terminal ileum appeared normal.                           Retroflexion in the right colon was performed.  The exam was otherwise without abnormality on                            direct and retroflexion views. The colon was                            somewhat tortuous. Complications:            No immediate complications. Estimated Blood Loss:     Estimated blood loss: none. Impression:               - The entire examined colon is mildly tortuous but                            normal.                           - Minimal sigmoid diverticulosis.                           - Non-bleeding internal hemorrhoids.                           - The examined portion of the ileum was normal.                           - The examination was otherwise normal on direct                            and retroflexion views.                           - No specimens collected. Recommendation:           - Patient has a contact number available for                             emergencies. The signs and symptoms of potential                            delayed complications were discussed with the                            patient. Return to normal activities tomorrow.                            Written discharge instructions were provided to the                            patient.                           - Resume previous diet.                           - Continue present medications including MiraLAX 17  g p.o. daily.                           - Repeat colonoscopy in 10 years for screening                            purposes. Earlier, with any new problems or change                            in family history.                           - The findings and recommendations were discussed                            with the patient's family. Lynnie Bring, MD 03/13/2024 1:56:58 PM This report has been signed electronically.

## 2024-03-13 NOTE — Progress Notes (Signed)
 Report to PACU, RN, vss, BBS= Clear.

## 2024-03-14 ENCOUNTER — Telehealth: Payer: Self-pay

## 2024-03-14 NOTE — Telephone Encounter (Signed)
 Post procedure follow up call, no answer

## 2024-03-20 ENCOUNTER — Encounter: Payer: Self-pay | Admitting: Oncology

## 2024-03-20 ENCOUNTER — Ambulatory Visit
Admission: RE | Admit: 2024-03-20 | Discharge: 2024-03-20 | Disposition: A | Discharge status: Home / Self Care | Source: Ambulatory Visit | Attending: Internal Medicine | Admitting: Internal Medicine

## 2024-03-20 DIAGNOSIS — Z1231 Encounter for screening mammogram for malignant neoplasm of breast: Secondary | ICD-10-CM

## 2024-03-21 ENCOUNTER — Other Ambulatory Visit: Payer: Self-pay | Admitting: Internal Medicine

## 2024-03-21 DIAGNOSIS — R2231 Localized swelling, mass and lump, right upper limb: Secondary | ICD-10-CM

## 2024-03-29 ENCOUNTER — Ambulatory Visit
Admission: RE | Admit: 2024-03-29 | Discharge: 2024-03-29 | Disposition: A | Discharge status: Home / Self Care | Source: Ambulatory Visit | Attending: Internal Medicine | Admitting: Internal Medicine

## 2024-03-29 DIAGNOSIS — R2231 Localized swelling, mass and lump, right upper limb: Secondary | ICD-10-CM

## 2024-04-11 ENCOUNTER — Telehealth: Payer: Self-pay | Admitting: Gastroenterology

## 2024-04-11 NOTE — Telephone Encounter (Signed)
 Inbound call from patient requesting to discuss repeat labs that are do. Requesting to know if she is able to have labs completed in [place]. Please advise, thank you

## 2024-04-11 NOTE — Telephone Encounter (Signed)
 Patient requesting upcoming labs be done at [facility] in [place] off [facility]. Orders faxed to [phone].

## 2024-04-11 NOTE — Addendum Note (Signed)
 Addended by: Emillio Ngo H on: 04/11/2024 01:01 PM   Modules accepted: Orders

## 2024-04-12 ENCOUNTER — Other Ambulatory Visit: Payer: Self-pay | Admitting: Gastroenterology

## 2024-04-13 LAB — HEPATIC FUNCTION PANEL
ALT: 60 IU/L — ABNORMAL HIGH (ref 0–32)
AST: 42 IU/L — ABNORMAL HIGH (ref 0–40)
Albumin: 4.1 g/dL (ref 3.8–4.9)
Alkaline Phosphatase: 193 IU/L — ABNORMAL HIGH (ref 44–121)
Bilirubin Total: 0.5 mg/dL (ref 0.0–1.2)
Bilirubin, Direct: 0.22 mg/dL (ref 0.00–0.40)
Total Protein: 7.2 g/dL (ref 6.0–8.5)

## 2024-04-28 ENCOUNTER — Ambulatory Visit: Payer: Self-pay | Admitting: Gastroenterology

## 2024-04-29 ENCOUNTER — Other Ambulatory Visit: Payer: Self-pay | Admitting: Gastroenterology

## 2024-04-29 NOTE — Telephone Encounter (Signed)
 Returned call to the patient and questions regarding lab work, medications, etc were answered to the best of my ability.    Patient thanked me for the call.

## 2024-04-29 NOTE — Telephone Encounter (Signed)
 Inbound call from pt stating that she had question in regards to her results she received this morning. Pt is requesting a call back.Please advise.

## 2024-05-15 ENCOUNTER — Telehealth: Payer: Self-pay

## 2024-05-15 NOTE — Telephone Encounter (Signed)
 Patient has been scheduled for a follow up with Dr. Charlanne on 07/23/24 at 3 pm. Note added that patient needs labs at time of appt.

## 2024-05-15 NOTE — Telephone Encounter (Signed)
-----   Message from [facility] R sent at 04/29/2024  9:33 AM EDT ----- Regarding: [id number] Patient needs an appointment with Dr Charlanne or POD B app the week of Dec 8th.  She will also need to have LFT's drawn at that appointment per lab work on 04-12-24.  Thank you

## 2024-05-31 ENCOUNTER — Encounter: Payer: Self-pay | Admitting: Cardiology

## 2024-05-31 ENCOUNTER — Ambulatory Visit: Attending: Cardiology | Admitting: Cardiology

## 2024-05-31 VITALS — BP 122/82 | HR 64 | Ht 64.25 in | Wt 163.2 lb

## 2024-05-31 DIAGNOSIS — I10 Essential (primary) hypertension: Secondary | ICD-10-CM

## 2024-05-31 DIAGNOSIS — R072 Precordial pain: Secondary | ICD-10-CM | POA: Diagnosis not present

## 2024-05-31 NOTE — Progress Notes (Signed)
 Cardiology Office Note:  .   Date:  05/31/2024  ID:  Angela Miles, DOB 1971-02-20, MRN [id number] PCP:  Angela Vicenta BRAVO, MD  Former Cardiology Providers: None [facility] Providers Cardiologist:  Madonna Large, DO , [facility] (established care 11/22/2022) Electrophysiologist:  None  Click to update primary MD,subspecialty MD or APP then REFRESH:1}    Chief Complaint  Patient presents with   Follow-up    1 year follow up     History of Present Illness: .   Angela Miles is a 53 y.o. Caucasian female whose past medical history and cardiovascular risk factors includes: Hypertension, Primary biliary cholangitis, anemia, roux en y gastric bypass (2008), cholecystectomy.   In April 2024 patient was referred to the practice for evaluation of precordial pain.  She did undergo ischemic workup which included echocardiogram and coronary CTA results mentioned below for further reference.  She presents today for 1 year follow-up visit.  Over the last year patient denies any anginal chest pain or heart failure symptoms. Her functional capacity remains relatively stable.  Review of Systems: .   Review of Systems  Cardiovascular:  Negative for chest pain, claudication, irregular heartbeat, leg swelling, near-syncope, orthopnea, palpitations, paroxysmal nocturnal dyspnea and syncope.  Respiratory:  Negative for shortness of breath.   Hematologic/Lymphatic: Negative for bleeding problem.    Studies Reviewed:   EKG: EKG Interpretation Date/Time:  Friday May 31 2024 16:02:28 EDT Ventricular Rate:  64 PR Interval:  136 QRS Duration:  88 QT Interval:  404 QTC Calculation: 416 R Axis:   22  Text Interpretation: Normal sinus rhythm Normal ECG No previous ECGs available Confirmed by Large Madonna [phone]) on 05/31/2024 4:09:15 PM  Echocardiogram: 11/23/2022:  Low normal LV systolic function with visual EF 50-55%. Left ventricle cavity is normal in size. Normal left ventricular wall  thickness. Normal global wall motion. Doppler evidence of grade I (impaired) diastolic dysfunction, normal LAP.  Mild (Grade I) mitral regurgitation. Mild tricuspid regurgitation. No evidence of pulmonary hypertension. No prior available for comparison.   Coronary CTA 11/29/2022 1. Total coronary calcium score of 0.  2. Normal coronary origin with right dominance.  3. CAD-RADS = 1 Minimal non-obstructive.  Left Main: Minimal (0-24%) non-calcified plaque at the distal left main otherwise patent.  LAD: Patent.  LCX: Patent.  RCA: Patent.  4. Study is sent for CT-FFR to further evaluate the left main. Findings will be performed and reported separately.  Noncardiac findings: No acute or unexpected extracardiac finding.  RECOMMENDATIONS: Consider non-atherosclerotic causes of chest pain. Consider preventive therapy and risk factor modification.  CT FFR November 29, 2022: CT FFR analysis showed no significant stenosis.   RADIOLOGY: NA  Risk Assessment/Calculations:   NA   Labs:       Latest Ref Rng & Units 01/29/2024    3:24 PM 06/03/2022   12:00 AM 11/29/2021   12:00 AM  CBC  WBC 4.0 - 10.5 K/uL 7.1  8.4     9.1      Hemoglobin 12.0 - 15.0 g/dL 88.1  86.9     86.8      Hematocrit 36.0 - 46.0 % 35.4  39     40      Platelets 150.0 - 400.0 K/uL 305.0  262     303         This result is from an external source.       Latest Ref Rng & Units 01/29/2024    3:24 PM 11/25/2022  8:03 AM 07/20/2021    4:42 PM  BMP  Glucose 70 - 99 mg/dL 91  92  87   BUN 6 - 23 mg/dL 15  16  15    Creatinine 0.40 - 1.20 mg/dL 9.48  9.41  9.29   BUN/Creat Ratio 9 - 23  28  21    Sodium 135 - 145 mEq/L 136  138  139   Potassium 3.5 - 5.1 mEq/L 4.1  4.0  3.9   Chloride 96 - 112 mEq/L 100  101  101   CO2 19 - 32 mEq/L 30  25  26    Calcium 8.4 - 10.5 mg/dL 8.9  9.1  9.1       Latest Ref Rng & Units 04/12/2024   10:59 AM 01/29/2024    3:24 PM 11/25/2022    8:03 AM  CMP  Glucose 70 - 99 mg/dL   91  92   BUN 6 - 23 mg/dL  15  16   Creatinine 9.59 - 1.20 mg/dL  9.48  9.41   Sodium 864 - 145 mEq/L  136  138   Potassium 3.5 - 5.1 mEq/L  4.1  4.0   Chloride 96 - 112 mEq/L  100  101   CO2 19 - 32 mEq/L  30  25   Calcium 8.4 - 10.5 mg/dL  8.9  9.1   Total Protein 6.0 - 8.5 g/dL 7.2  7.7  7.0   Total Bilirubin 0.0 - 1.2 mg/dL 0.5  0.6  0.7   Alkaline Phos 44 - 121 IU/L 193  194  136   AST 0 - 40 IU/L 42  35  45   ALT 0 - 32 IU/L 60  66  94     Lab Results  Component Value Date   CHOL 151 11/25/2022   HDL 64 11/25/2022   LDLCALC 71 11/25/2022   LDLDIRECT 66 11/25/2022   TRIG 88 11/25/2022   CHOLHDL 2.4 11/25/2022   No results for input(s): LIPOA in the last 8760 hours. No components found for: NTPROBNP No results for input(s): PROBNP in the last 8760 hours. Recent Labs    01/29/24 1524  TSH 1.36    Physical Exam:    Today's Vitals   05/31/24 1558  BP: 122/82  Pulse: 64  SpO2: 98%  Weight: 163 lb 3.2 oz (74 kg)  Height: 5' 4.25 (1.632 m)   Body mass index is 27.8 kg/m. Wt Readings from Last 3 Encounters:  05/31/24 163 lb 3.2 oz (74 kg)  03/13/24 162 lb (73.5 kg)  01/29/24 162 lb 6 oz (73.7 kg)    Physical Exam  Constitutional: No distress.  hemodynamically stable  Neck: No JVD present.  Cardiovascular: Normal rate, regular rhythm, S1 normal and S2 normal. Exam reveals no gallop, no S3 and no S4.  No murmur heard. Pulmonary/Chest: Effort normal and breath sounds normal. No stridor. She has no wheezes. She has no rales.  Musculoskeletal:        General: No edema.     Cervical back: Neck supple.  Skin: Skin is warm.     Impression & Recommendation(s):  Impression:   ICD-10-CM   1. Precordial pain  R07.2     2. Benign hypertension  I10 EKG 12-Lead       Recommendation(s):  Patient was referred to the practice for evaluation of precordial pain.  She went appropriate workup including echocardiogram and coronary CTA.  Echocardiogram noted low  normal LVEF with mild valvular heart  disease.  Coronary CTA noted total CAC of 0 with minimal nonobstructive disease.  Over the last 1 year she denies anginal chest pain or heart failure symptoms.  Her overall functional capacity remains relatively stable.  No hospitalizations or urgent care visits for cardiovascular reasons.  Outside labs from July 2025 independently reviewed which illustrated an LDL of 85 mg/dL, triglycerides of 83 mg/dL and hemoglobin J8r of 4.8%  Since she is asymptomatic no need for repeating echocardiogram at this time.  Would recommend that she follows up with PCP for repeat echocardiogram in 2027 to follow-up on MR and TR.  Sooner if change in clinical status.  Blood pressures are well-controlled on current medical therapy.  Continue valsartan  80 mg p.o. every afternoon and metoprolol  succinate 25 mg p.o. every morning.  Patient plans to lose weight given her primary biliary cholangitis.  As she starts losing weight and her blood pressures trend low would recommend discontinuing Toprol -XL prior to down titration of valsartan .  Will see her back on as-needed basis.  Plan of care discussed with the patient, who is in agreement.  Orders Placed:  Orders Placed This Encounter  Procedures   EKG 12-Lead     Final Medication List:   No orders of the defined types were placed in this encounter.   Medications Discontinued During This Encounter  Medication Reason   ANUCORT-HC  25 MG suppository    hydrocortisone  (ANUSOL -HC) 2.5 % rectal cream    hydrocortisone  (ANUSOL -HC) 25 MG suppository      Current Outpatient Medications:    Calcium Carbonate (CALCIUM 600 PO), Take 600 mg by mouth 2 (two) times daily., Disp: , Rfl:    cholecalciferol (VITAMIN D3) 25 MCG (1000 UNIT) tablet, Take 2,000 Units by mouth daily., Disp: , Rfl:    Loteprednol Etabonate 0.5 % GEL, , Disp: , Rfl:    metoprolol  succinate (TOPROL  XL) 25 MG 24 hr tablet, Take 1 tablet (25 mg total) by mouth in  the morning., Disp: 90 tablet, Rfl: 3   neomycin-polymyxin b-dexamethasone  (MAXITROL) 3.5-10000-0.1 SUSP, INSTILL 1 DROP IN AFFECTED EYE(S) AS DIRECTED THREE TIMES DAILY FOR 3 DAYS, TWICE DAILY FOR 2 DAYS, THEN ONCE A DAY FOR 1 DAY, Disp: , Rfl:    ursodiol  (ACTIGALL ) 500 MG tablet, TAKE 1 TABLET(500 MG) BY MOUTH TWICE DAILY, Disp: 180 tablet, Rfl: 2   valsartan  (DIOVAN ) 80 MG tablet, Take 1 tablet (80 mg total) by mouth at bedtime., Disp: 90 tablet, Rfl: 3   vitamin E 1000 UNIT capsule, Take 1,000 Units by mouth daily., Disp: , Rfl:    tiZANidine (ZANAFLEX) 4 MG tablet, Take 4 mg by mouth as needed. (Patient not taking: Reported on 05/31/2024), Disp: , Rfl:   Consent:   NA  Disposition:   As needed  Her questions and concerns were addressed to her satisfaction. She voices understanding of the recommendations provided during this encounter.    Signed, Madonna Michele HAS, [facility]  A Division of [facility] [address] [place], [place] [address]  05/31/2024 5:19 PM

## 2024-05-31 NOTE — Patient Instructions (Addendum)
 Medication Instructions:  Continue same medications  Lab Work: None ordered  Testing/Procedures: None ordered  Follow-Up: At [facility], you and your health needs are our priority.  As part of our continuing mission to provide you with exceptional heart care, our providers are all part of one team.  This team includes your primary Cardiologist (physician) and Advanced Practice Providers or APPs (Physician Assistants and Nurse Practitioners) who all work together to provide you with the care you need, when you need it.  Your next appointment:  As Needed    Provider:  Dr.Tolia   We recommend signing up for the patient portal called MyChart.  Sign up information is provided on this After Visit Summary.  MyChart is used to connect with patients for Virtual Visits (Telemedicine).  Patients are able to view lab/test results, encounter notes, upcoming appointments, etc.  Non-urgent messages can be sent to your provider as well.   To learn more about what you can do with MyChart, go to [email or web address].

## 2024-07-23 ENCOUNTER — Ambulatory Visit: Admitting: Gastroenterology

## 2024-08-05 ENCOUNTER — Other Ambulatory Visit: Payer: Self-pay

## 2024-08-05 ENCOUNTER — Telehealth: Payer: Self-pay

## 2024-08-05 DIAGNOSIS — R748 Abnormal levels of other serum enzymes: Secondary | ICD-10-CM

## 2024-08-05 DIAGNOSIS — K862 Cyst of pancreas: Secondary | ICD-10-CM

## 2024-08-05 NOTE — Telephone Encounter (Signed)
 Reminder was received in Epic to repeat MRI.  Pt made aware. Order was placed in Epic for repeat MRI to be done at [facility]. Pt made aware.   Pt requested that her LFT be retested. Chart reviewed and noted recent documentation. Orders for labs placed in Epic. Pt requested that labs be drawn at [facility] near her house.  Pt verbalized understanding with all questions answered.

## 2024-08-05 NOTE — Telephone Encounter (Signed)
-----   Message from Nurse Elspeth RAMAN, RN sent at 08/05/2022 10:16 AM EST ----- Personal Reminder Sent 08/05/2022  Repeat MRI in 2 years to ensure stability, as suggested by radiology

## 2024-08-06 ENCOUNTER — Other Ambulatory Visit: Payer: Self-pay

## 2024-08-06 ENCOUNTER — Ambulatory Visit: Payer: Self-pay | Admitting: Physician Assistant

## 2024-08-06 DIAGNOSIS — R748 Abnormal levels of other serum enzymes: Secondary | ICD-10-CM

## 2024-08-06 DIAGNOSIS — K862 Cyst of pancreas: Secondary | ICD-10-CM

## 2024-08-06 DIAGNOSIS — K743 Primary biliary cirrhosis: Secondary | ICD-10-CM

## 2024-08-06 LAB — HEPATIC FUNCTION PANEL
ALT: 79 IU/L — ABNORMAL HIGH (ref 0–32)
AST: 60 IU/L — ABNORMAL HIGH (ref 0–40)
Albumin: 4.1 g/dL (ref 3.8–4.9)
Alkaline Phosphatase: 280 IU/L — ABNORMAL HIGH (ref 49–135)
Bilirubin Total: 0.7 mg/dL (ref 0.0–1.2)
Bilirubin, Direct: 0.29 mg/dL (ref 0.00–0.40)
Total Protein: 7.1 g/dL (ref 6.0–8.5)

## 2024-08-06 NOTE — Telephone Encounter (Signed)
 Patient Dr. Charlanne with known primary biliary cholangitis.   On Urso  15 mg/kg with continuing increased alk phos and LFTs with upper abdominal pain. The laboratory values and upper back pain warrant a thorough evaluation before initiating the Livdelzi.  The new medication is contraindicated in patients with biliary obstruction so we need to rule that out prior to starting especially with the associated pain. If the MRCP is unremarkable then I think we can start the process of adding the Livdelzi.  Please make sure the MRCP has urgent to read on it. Go to the ER if you have any yellowing of the skin/eyes, dark urine, severe AB pain, fever, chills, nausea or vomiting.

## 2024-08-06 NOTE — Telephone Encounter (Signed)
 Spoke w pt about getting a new medication rx due to recent lab values doubling. Pt stated she wants to try [facility].  Pt requesting a call back to discuss. Please advise thank you.

## 2024-08-06 NOTE — Telephone Encounter (Signed)
 Pt made aware of recommendations. MRCP added on to MRI abdomen, Pt made aware.  [facility] was called and made aware of the change. Pt hadto be rescheduled to the next day on 08/23/2023 at 9:00 AM . [facility] [place] stated that they would call the pt. Pt was made aware.  Pt verbalized understanding with all questions answered.

## 2024-08-06 NOTE — Telephone Encounter (Signed)
 Dr. Charlanne Angela Miles. Please review notes below. Angela Miles had labs done yesterday with an increase in lab values. Angela Miles stated that she has been having upper back pain that she believes that is coming from her primary biliary cholangitis and is now very concerned with her labs.  She does have an MRI abdomen scheduled for Jan 7th  and office visit scheduled with Dr. Charlanne on 09/04/2024 at 4:00 PM.  Angela Miles is requesting to start [facility] now as she stated that she has researched this medication and feels that she may benefit form it.  Please review and advise.

## 2024-08-15 ENCOUNTER — Encounter: Payer: Self-pay | Admitting: Oncology

## 2024-08-16 ENCOUNTER — Encounter: Payer: Self-pay | Admitting: Oncology

## 2024-08-20 ENCOUNTER — Encounter: Payer: Self-pay | Admitting: Gastroenterology

## 2024-08-21 ENCOUNTER — Other Ambulatory Visit

## 2024-08-22 ENCOUNTER — Inpatient Hospital Stay: Admission: RE | Admit: 2024-08-22 | Source: Ambulatory Visit

## 2024-08-22 ENCOUNTER — Other Ambulatory Visit

## 2024-08-29 ENCOUNTER — Inpatient Hospital Stay: Admission: RE | Admit: 2024-08-29 | Discharge: 2024-08-29 | Attending: Gastroenterology | Admitting: Gastroenterology

## 2024-08-29 DIAGNOSIS — R748 Abnormal levels of other serum enzymes: Secondary | ICD-10-CM

## 2024-08-29 DIAGNOSIS — K743 Primary biliary cirrhosis: Secondary | ICD-10-CM

## 2024-08-29 DIAGNOSIS — K862 Cyst of pancreas: Secondary | ICD-10-CM

## 2024-08-29 MED ORDER — GADOPICLENOL 0.5 MMOL/ML IV SOLN
10.0000 mL | Freq: Once | INTRAVENOUS | Status: AC | PRN
  Administered 2024-08-29: 10 mL via INTRAVENOUS

## 2024-09-04 ENCOUNTER — Encounter: Payer: Self-pay | Admitting: Gastroenterology

## 2024-09-04 ENCOUNTER — Ambulatory Visit: Admitting: Gastroenterology

## 2024-09-04 ENCOUNTER — Other Ambulatory Visit

## 2024-09-04 VITALS — Ht 64.25 in | Wt 169.4 lb

## 2024-09-04 DIAGNOSIS — K743 Primary biliary cirrhosis: Secondary | ICD-10-CM

## 2024-09-04 DIAGNOSIS — K5900 Constipation, unspecified: Secondary | ICD-10-CM | POA: Diagnosis not present

## 2024-09-04 DIAGNOSIS — D509 Iron deficiency anemia, unspecified: Secondary | ICD-10-CM

## 2024-09-04 DIAGNOSIS — K8689 Other specified diseases of pancreas: Secondary | ICD-10-CM | POA: Diagnosis not present

## 2024-09-04 LAB — COMPREHENSIVE METABOLIC PANEL WITH GFR
ALT: 36 U/L — ABNORMAL HIGH (ref 3–35)
AST: 22 U/L (ref 5–37)
Albumin: 4 g/dL (ref 3.5–5.2)
Alkaline Phosphatase: 170 U/L — ABNORMAL HIGH (ref 39–117)
BUN: 14 mg/dL (ref 6–23)
CO2: 29 meq/L (ref 19–32)
Calcium: 9.3 mg/dL (ref 8.4–10.5)
Chloride: 99 meq/L (ref 96–112)
Creatinine, Ser: 0.59 mg/dL (ref 0.40–1.20)
GFR: 102.7 mL/min
Glucose, Bld: 85 mg/dL (ref 70–99)
Potassium: 3.8 meq/L (ref 3.5–5.1)
Sodium: 135 meq/L (ref 135–145)
Total Bilirubin: 0.5 mg/dL (ref 0.2–1.2)
Total Protein: 7.7 g/dL (ref 6.0–8.3)

## 2024-09-04 LAB — CBC WITH DIFFERENTIAL/PLATELET
Basophils Absolute: 0 K/uL (ref 0.0–0.1)
Basophils Relative: 0.5 % (ref 0.0–3.0)
Eosinophils Absolute: 0.1 K/uL (ref 0.0–0.7)
Eosinophils Relative: 1.2 % (ref 0.0–5.0)
HCT: 33.8 % — ABNORMAL LOW (ref 36.0–46.0)
Hemoglobin: 11.5 g/dL — ABNORMAL LOW (ref 12.0–15.0)
Lymphocytes Relative: 25.2 % (ref 12.0–46.0)
Lymphs Abs: 1.8 K/uL (ref 0.7–4.0)
MCHC: 34.1 g/dL (ref 30.0–36.0)
MCV: 87.3 fl (ref 78.0–100.0)
Monocytes Absolute: 0.5 K/uL (ref 0.1–1.0)
Monocytes Relative: 7.1 % (ref 3.0–12.0)
Neutro Abs: 4.8 K/uL (ref 1.4–7.7)
Neutrophils Relative %: 66 % (ref 43.0–77.0)
Platelets: 250 K/uL (ref 150.0–400.0)
RBC: 3.87 Mil/uL (ref 3.87–5.11)
RDW: 14.5 % (ref 11.5–15.5)
WBC: 7.3 K/uL (ref 4.0–10.5)

## 2024-09-04 LAB — C-REACTIVE PROTEIN: CRP: 0.8 mg/dL — ABNORMAL LOW (ref 1.0–20.0)

## 2024-09-04 LAB — PROTIME-INR
INR: 0.9 ratio (ref 0.8–1.0)
Prothrombin Time: 10.3 s (ref 9.6–13.1)

## 2024-09-04 NOTE — Patient Instructions (Addendum)
 _______________________________________________________  If your blood pressure at your visit was 140/90 or greater, please contact your primary care physician to follow up on this.  _______________________________________________________  If you are age 54 or older, your body mass index should be between 23-30. Your Body mass index is 28.85 kg/m. If this is out of the aforementioned range listed, please consider follow up with your Primary Care Provider.  If you are age 10 or younger, your body mass index should be between 19-25. Your Body mass index is 28.85 kg/m. If this is out of the aformentioned range listed, please consider follow up with your Primary Care Provider.   ________________________________________________________  The [facility] providers would like to encourage you to use MYCHART to communicate with providers for non-urgent requests or questions.  Due to long hold times on the telephone, sending your provider a message by [facility] may be a faster and more efficient way to get a response.  Please allow 48 business hours for a response.  Please remember that this is for non-urgent requests.  _______________________________________________________  [facility] is using a team-based approach to care.  Your team is made up of your doctor and two to three APPS. Our APPS (Nurse Practitioners and Physician Assistants) work with your physician to ensure care continuity for you. They are fully qualified to address your health concerns and develop a treatment plan. They communicate directly with your gastroenterologist to care for you. Seeing the Advanced Practice Practitioners on your physician's team can help you by facilitating care more promptly, often allowing for earlier appointments, access to diagnostic testing, procedures, and other specialty referrals.   Your provider has requested that you go to the basement level for lab work before leaving today. Press B on the  elevator. The lab is located at the first door on the left as you exit the elevator.  Continue Miralax 17g 2 times a day until large bowel and then can do daily  Avoid tylenol   Try to reduce weight gradually  Continue with ursodiol   Radiology will be reaching out to you regarding your liver biopsy. If you haven't heard anything in 1 week please call central radiology scheduling at [phone] to schedule  A referral will be sent to [facility]. Please call us  in 2 weeks if you havent heard from them  Thank you,  Dr. Lynnie Bring

## 2024-09-04 NOTE — Progress Notes (Signed)
 "   Chief Complaint: FU  Referring Provider:  Keren Vicenta BRAVO, MD      ASSESSMENT AND PLAN;    1. AMA+ PBC/MASH. Dx on Liver Bx 03/2020. Elevated Alk Phos despite maximal urso  (15mg /kg/day). No Cirrhosis or biliary obs on MRCP.  2. Constipation. Neg colon 02/2024. 3. H/O morbid obesity s/p RYGB 2008.  No obvious liver cirrhosis. Nl Plts, Alb (01/2024) 4. Sidebranch pancreatic IPMN (stable). Stable MRCP 08/2024. No H/O pancreatitis. 5. IDA d/t gastric bypass on periodic parenteral iron  (Dr Ezzard)- resolved.  Has assoc B12 and folate deficiency.   Plan:  -Continue URSO  500mg  po BID 180, 11RF -Rpt IR liver Bx to r/o overlap syndrome. If significant MASH, will benefit from GLP-1. -Add seladelpar after liver Bx (if OK with [facility]) -CBC, CMP, INR, celiac serology, CRP -Continue mirlax 17g po every day until good BM, then every day. -Bone density scan results - done in Aug(Dr Keren) -Avoid tylenol  (esp in pain meds) -Vit E 400 every day to continue  -Reduce wt -Appt with [facility] to get established and management of PBC. (Dr Manuelita Novak)    D/W pt and husband in detail  HPI:    Angela Miles is a 54 y.o. female  With H/O RYGB 2008 (with resultant 129lb wt loss), anxiety/depression, HTN, EF 50-55%,(2DE 11/2022), obesity, cholecystectomy,  With PBC/MASH  History of Present Illness Angela Miles is a 54 year old female with PBC/fatty liver who presents for evaluation of worsening liver function tests. Has worsening back pain and inflammation in the entire body.  Dx with AMA + PBC in August 2021 by liver biopsy with MASH, she has been taking ursodiol  with previously good response.  Her alk phos normalized.  Unfortunately, she lost her insurance and stopped taking Urso  last year with resultant increased alkaline phosphatase.  URSO  restarted.  Alkaline phosphatase initially went down but then started increasing as below.  She has assured me that she is compliant with  medications.  Also has gained weight from 162lb (02/2024) to 169lb (1/21) after reaching lowest weight of 154 lb.  No H/O itching, skin lesions, easy bruisability, intake of OTC meds including diet pills, herbal medications, anabolic steroids or significant Tylenol . There is no H/O blood transfusions, IVDA or FH of liver disease. No jaundice, dark urine or pale stools. No alcohol abuse.  Has chronic constipation, being managed by Mira[facility] as needed.  No melena or hematochezia.  She denies having any N/V/fever or chills.  No significant joint pains  Seen by Dr. Keren for generalized inflammation and back pain, given hydrocodone without Tylenol  with some relief.  Had bone density scan in August 2025 and was diagnosed as having osteopenia.  Has been on calcium 1200 mg and vitamin D every night.   She had normal fasting lipid profile including cholesterol of 151 (2025)     Latest Ref Rng & Units 08/05/2024   10:12 AM 04/12/2024   10:59 AM 01/29/2024    3:24 PM  Hepatic Function  Total Protein 6.0 - 8.5 g/dL 7.1  7.2  7.7   Albumin 3.8 - 4.9 g/dL 4.1  4.1  4.0   AST 0 - 40 IU/L 60  42  35   ALT 0 - 32 IU/L 79  60  66   Alk Phosphatase 49 - 135 IU/L 280  193  194   Total Bilirubin 0.0 - 1.2 mg/dL 0.7  0.5  0.6   Bilirubin, Direct 0.00 - 0.40 mg/dL 9.70  0.22      Recent MRCP Jan 2026-without any evidence of liver cirrhosis or significant biliary ductal dilatation.  She did have postcholecystectomy changes.  Stable IPMN.   Has been compliant with B12.   Wt Readings from Last 3 Encounters:  09/04/24 169 lb 6 oz (76.8 kg)  05/31/24 163 lb 3.2 oz (74 kg)  03/13/24 162 lb (73.5 kg)       Previous GI work-up:  Liver bx 03/2020 revealed PBC with grade 1-2/4 fibrosis.  USE 11/2022 -Normal liver architecture -K PA 5.9  Colon 02/2024: The entire examined colon is mildly tortuous but normal. - Minimal sigmoid diverticulosis. - Non- bleeding internal hemorrhoids. - The examined portion  of the ileum was normal. Rpt in 10 yrs Neg colonoscopy by Dr. Towana in 2017  MRCP 08/2024 1. Stable small cystic pancreatic lesions, likely small side branch IPMN's. These appear unchanged from baseline study of 04/01/2020. No pancreatic ductal dilatation or acute abnormality. Consider continued follow-up surveillance with MRI/MRCP in 2 years. This recommendation follows ACR consensus guidelines: Management of Incidental Pancreatic Cysts: A White Paper of the ACR Incidental Findings Committee. J Am Coll Radiol 2017;14:911-923. 2. Stable mild extrahepatic biliary dilatation status post cholecystectomy, within physiologic limits. 3. Stable prominent lymph nodes in the porta hepatis, likely Reactive   MRCP 08/02/2022 1. Stable 5 mm cystic lesion in the pancreatic body without suspicious MRI features, likely reflecting a side branch IPMN. Recommend follow up pre and post contrast MRI/MRCP in 2 years. 2. Stable benign Bosniak classification 1 and 2 renal cysts require no independent imaging follow-up.  MRI liver with and without contrast 03/2020 1. No acute findings within the abdomen. 2. Previous cholecystectomy. 3. Small 6 mm cystic structure associated with the neck of pancreas is identified. This is technically too small to further characterize. Follow-up imaging with repeat MRI in 12 months without and with contrast material recommended. 4. Bilateral subcentimeter T2 hyperintense kidney lesions are technically too small to reliably characterize. The largest is in the anterior cortex of the inferior pole of the right kidney measuring 8 mm. This may be mildly complex containing a thin internal area of septation. Attention on follow-up MRI is recommended.  CT AP June 2021 showing significant heterogenous enhancement pattern throughout the liver which could be related to intrinsic liver disease.  No definite liver cirrhosis.  From previous note: referred to GI clinic for abn LFTs:  AST 56, ALT 106, alk phos 132 with normal CBC hemoglobin 13.0, platelets 312.  Note that iron  studies were normal.    Wt Readings from Last 3 Encounters:  09/04/24 169 lb 6 oz (76.8 kg)  05/31/24 163 lb 3.2 oz (74 kg)  03/13/24 162 lb (73.5 kg)      Latest Ref Rng & Units 08/05/2024   10:12 AM 04/12/2024   10:59 AM 01/29/2024    3:24 PM  Hepatic Function  Total Protein 6.0 - 8.5 g/dL 7.1  7.2  7.7   Albumin 3.8 - 4.9 g/dL 4.1  4.1  4.0   AST 0 - 40 IU/L 60  42  35   ALT 0 - 32 IU/L 79  60  66   Alk Phosphatase 49 - 135 IU/L 280  193  194   Total Bilirubin 0.0 - 1.2 mg/dL 0.7  0.5  0.6   Bilirubin, Direct 0.00 - 0.40 mg/dL 9.70  9.77       SH-works for [employer].  Has very high-end clients like [employer] Past Medical  History:  Diagnosis Date   Anemia    Asthma    bronchitis   Hypertension     Past Surgical History:  Procedure Laterality Date   BREAST EXCISIONAL BIOPSY Right    2014   BREAST LUMPECTOMY WITH NEEDLE LOCALIZATION Right 02/26/2013   Procedure: BREAST LUMPECTOMY WITH NEEDLE LOCALIZATION;  Surgeon: Debby A. Cornett, MD;  Location:  SUR[facility]ERY CENTER;  Service: General;  Laterality: Right;  NEEDLE LOCALIZATION AT [facility] [place] 7;30    CERVICAL BIOPSY     CESAREAN SECTION  09/2003   CHOLECYSTECTOMY     COLONOSCOPY     Dr Anette 2-14 around 2017   COSMETIC SURGERY  01/26/2023   Lipo surgery on abdominal, sides and arms   COSMETIC SURGERY     abd and arms   DILATION AND CURETTAGE OF UTERUS  2009   uterine ablation   ESOPHAGOGASTRODUODENOSCOPY     around 08   GASTRIC BYPASS  2008    Family History  Problem Relation Age of Onset   Hypertension Mother    Colon polyps Father    Dementia Father    Hypertension Father    Colon cancer Paternal Higinio        has a colestomy bag now. Around 60's   Esophageal cancer Neg Hx    Rectal cancer Neg Hx    Stomach cancer Neg Hx     Social History   Tobacco Use   Smoking status: Former     Current packs/day: 0.00    Types: Cigarettes    Quit date: 02/11/2009    Years since quitting: 15.5   Smokeless tobacco: Never  Vaping Use   Vaping status: Never Used  Substance Use Topics   Alcohol use: Not Currently    Comment: rare   Drug use: No    Current Outpatient Medications  Medication Sig Dispense Refill   Calcium Carbonate (CALCIUM 600 PO) Take 600 mg by mouth 2 (two) times daily.     cholecalciferol (VITAMIN D3) 25 MCG (1000 UNIT) tablet Take 2,000 Units by mouth daily.     HYDROcodone-acetaminophen  (NORCO/VICODIN) 5-325 MG tablet Take 1 tablet by mouth every 4 (four) hours as needed.     metoprolol  succinate (TOPROL  XL) 25 MG 24 hr tablet Take 1 tablet (25 mg total) by mouth in the morning. 90 tablet 3   tiZANidine (ZANAFLEX) 4 MG tablet Take 4 mg by mouth as needed.     ursodiol  (ACTIGALL ) 500 MG tablet TAKE 1 TABLET(500 MG) BY MOUTH TWICE DAILY 180 tablet 2   valsartan  (DIOVAN ) 80 MG tablet Take 1 tablet (80 mg total) by mouth at bedtime. 90 tablet 3   vitamin E 1000 UNIT capsule Take 1,000 Units by mouth daily.     No current facility-administered medications for this visit.    Allergies  Allergen Reactions   Nsaids Palpitations   Penicillins Nausea Only    Other reaction(s): Not available   Sulfa Antibiotics Palpitations    Review of Systems:  neg     Physical Exam:    Ht 5' 5.5 (1.664 m)   Wt 169 lb 6 oz (76.8 kg)   BMI 27.76 kg/m  Wt Readings from Last 3 Encounters:  09/04/24 169 lb 6 oz (76.8 kg)  05/31/24 163 lb 3.2 oz (74 kg)  03/13/24 162 lb (73.5 kg)   Constitutional:  Well-developed, in no acute distress. Psychiatric: Normal mood and affect. Behavior is normal. HEENT: Pupils normal.  Conjunctivae are normal.  No scleral icterus. Cardiovascular: Normal rate, regular rhythm. No edema Pulmonary/chest: Effort normal and breath sounds normal. No wheezing, rales or rhonchi. Abdominal: Soft, nondistended. Nontender. Bowel sounds active  throughout. There are no masses palpable. Has hepatomegaly -liver palpated 4 cm below the costal margin. Rectal:  defered Neurological: Alert and oriented to person place and time. Skin: Skin is warm and dry. No rashes noted.  Data Reviewed: I have personally reviewed following labs and imaging studies  CBC:    Latest Ref Rng & Units 01/29/2024    3:24 PM 06/03/2022   12:00 AM 11/29/2021   12:00 AM  CBC  WBC 4.0 - 10.5 K/uL 7.1  8.4     9.1      Hemoglobin 12.0 - 15.0 g/dL 88.1  86.9     86.8      Hematocrit 36.0 - 46.0 % 35.4  39     40      Platelets 150.0 - 400.0 K/uL 305.0  262     303         This result is from an external source.    CMP:    Latest Ref Rng & Units 08/05/2024   10:12 AM 04/12/2024   10:59 AM 01/29/2024    3:24 PM  CMP  Glucose 70 - 99 mg/dL   91   BUN 6 - 23 mg/dL   15   Creatinine 9.59 - 1.20 mg/dL   9.48   Sodium 864 - 854 mEq/L   136   Potassium 3.5 - 5.1 mEq/L   4.1   Chloride 96 - 112 mEq/L   100   CO2 19 - 32 mEq/L   30   Calcium 8.4 - 10.5 mg/dL   8.9   Total Protein 6.0 - 8.5 g/dL 7.1  7.2  7.7   Total Bilirubin 0.0 - 1.2 mg/dL 0.7  0.5  0.6   Alkaline Phos 49 - 135 IU/L 280  193  194   AST 0 - 40 IU/L 60  42  35   ALT 0 - 32 IU/L 79  60  66      Anselm Bring, MD 09/04/2024, 3:52 PM  Cc: Keren Vicenta BRAVO, MD "

## 2024-09-05 ENCOUNTER — Telehealth: Payer: Self-pay

## 2024-09-05 NOTE — Telephone Encounter (Signed)
 Referral faxed to [phone] for [facility] to get established and management of [facility] clinic

## 2024-09-06 LAB — CELIAC PANEL 10
Deamidated Gliadin Abs, IgA: 7 U (ref 0–19)
Deamidated Gliadin Abs, IgG: 3 U (ref 0–19)
Immunoglobulin A, (IgA) QN, Serum: 349 mg/dL (ref 87–352)
t-Transglutaminase (tTG) IgA: <2 U/mL (ref 0–3)
t-Transglutaminase (tTG) IgG: <2 U/mL (ref 0–5)

## 2024-09-06 NOTE — Telephone Encounter (Signed)
 Called [facility] at [phone] and left voicemail to see if they have received the referral

## 2024-09-08 ENCOUNTER — Ambulatory Visit: Payer: Self-pay | Admitting: Gastroenterology

## 2024-09-12 NOTE — Telephone Encounter (Signed)
 Patient called stating she is out of town and unable to read [facility]pl group. Requesting a call back. Please advise, thank you

## 2024-09-12 NOTE — Telephone Encounter (Signed)
 Patient scheduled for 2-4 at 2pm with Dr Myrna in [place]

## 2024-09-20 ENCOUNTER — Other Ambulatory Visit: Payer: Self-pay | Admitting: Student

## 2024-09-23 ENCOUNTER — Ambulatory Visit (HOSPITAL_COMMUNITY)

## 2024-09-23 DIAGNOSIS — Z01818 Encounter for other preprocedural examination: Secondary | ICD-10-CM
